# Patient Record
Sex: Female | Born: 1935 | Race: White | Hispanic: No | Marital: Single | Smoking: Never smoker
Health system: Southern US, Community
[De-identification: ages and names within clinical notes are randomized; demographics above are authoritative.]

## PROBLEM LIST (undated history)

## (undated) DIAGNOSIS — Z860100 Personal history of colon polyps, unspecified: Secondary | ICD-10-CM

## (undated) DIAGNOSIS — L02419 Cutaneous abscess of limb, unspecified: Secondary | ICD-10-CM

## (undated) DIAGNOSIS — E785 Hyperlipidemia, unspecified: Secondary | ICD-10-CM

## (undated) DIAGNOSIS — I4510 Unspecified right bundle-branch block: Secondary | ICD-10-CM

## (undated) DIAGNOSIS — E119 Type 2 diabetes mellitus without complications: Secondary | ICD-10-CM

## (undated) DIAGNOSIS — L03119 Cellulitis of unspecified part of limb: Secondary | ICD-10-CM

## (undated) DIAGNOSIS — D649 Anemia, unspecified: Secondary | ICD-10-CM

## (undated) DIAGNOSIS — K5790 Diverticulosis of intestine, part unspecified, without perforation or abscess without bleeding: Secondary | ICD-10-CM

## (undated) DIAGNOSIS — M722 Plantar fascial fibromatosis: Secondary | ICD-10-CM

## (undated) DIAGNOSIS — I482 Chronic atrial fibrillation, unspecified: Secondary | ICD-10-CM

## (undated) DIAGNOSIS — I10 Essential (primary) hypertension: Secondary | ICD-10-CM

## (undated) DIAGNOSIS — K922 Gastrointestinal hemorrhage, unspecified: Secondary | ICD-10-CM

## (undated) DIAGNOSIS — E669 Obesity, unspecified: Secondary | ICD-10-CM

## (undated) DIAGNOSIS — K2970 Gastritis, unspecified, without bleeding: Secondary | ICD-10-CM

## (undated) HISTORY — DX: Personal history of colon polyps, unspecified: Z86.0100

## (undated) HISTORY — DX: Gastritis, unspecified, without bleeding: K29.70

## (undated) HISTORY — DX: Plantar fascial fibromatosis: M72.2

## (undated) HISTORY — DX: Unspecified right bundle-branch block: I45.10

## (undated) HISTORY — DX: Obesity, unspecified: E66.9

## (undated) HISTORY — DX: Gastrointestinal hemorrhage, unspecified: K92.2

## (undated) HISTORY — DX: Anemia, unspecified: D64.9

## (undated) HISTORY — DX: Cellulitis of unspecified part of limb: L03.119

## (undated) HISTORY — DX: Essential (primary) hypertension: I10

## (undated) HISTORY — DX: Cutaneous abscess of limb, unspecified: L02.419

## (undated) HISTORY — DX: Chronic atrial fibrillation, unspecified: I48.20

## (undated) HISTORY — DX: Hyperlipidemia, unspecified: E78.5

## (undated) HISTORY — DX: Type 2 diabetes mellitus without complications: E11.9

## (undated) HISTORY — DX: Personal history of colonic polyps: Z86.010

## (undated) HISTORY — PX: NO PAST SURGERIES: SHX2092

## (undated) HISTORY — DX: Diverticulosis of intestine, part unspecified, without perforation or abscess without bleeding: K57.90

---

## 1999-01-09 ENCOUNTER — Encounter: Payer: Self-pay | Admitting: Obstetrics and Gynecology

## 1999-01-10 ENCOUNTER — Ambulatory Visit (HOSPITAL_COMMUNITY): Admission: RE | Admit: 1999-01-10 | Discharge: 1999-01-10 | Payer: Self-pay | Admitting: Obstetrics and Gynecology

## 1999-10-26 ENCOUNTER — Ambulatory Visit (HOSPITAL_COMMUNITY): Admission: RE | Admit: 1999-10-26 | Discharge: 1999-10-26 | Payer: Self-pay | Admitting: Gastroenterology

## 1999-10-26 ENCOUNTER — Encounter (INDEPENDENT_AMBULATORY_CARE_PROVIDER_SITE_OTHER): Payer: Self-pay | Admitting: *Deleted

## 2001-12-28 ENCOUNTER — Ambulatory Visit (HOSPITAL_COMMUNITY): Admission: RE | Admit: 2001-12-28 | Discharge: 2001-12-28 | Payer: Self-pay | Admitting: Gastroenterology

## 2009-05-04 ENCOUNTER — Encounter: Admission: RE | Admit: 2009-05-04 | Discharge: 2009-05-04 | Payer: Self-pay | Admitting: Internal Medicine

## 2010-12-06 ENCOUNTER — Encounter: Payer: Self-pay | Admitting: Emergency Medicine

## 2010-12-21 ENCOUNTER — Encounter: Payer: Self-pay | Admitting: Emergency Medicine

## 2010-12-25 ENCOUNTER — Encounter: Payer: Self-pay | Admitting: Emergency Medicine

## 2011-01-15 ENCOUNTER — Encounter: Payer: Self-pay | Admitting: Emergency Medicine

## 2011-01-15 DIAGNOSIS — I4891 Unspecified atrial fibrillation: Secondary | ICD-10-CM | POA: Insufficient documentation

## 2011-01-15 DIAGNOSIS — Z860100 Personal history of colon polyps, unspecified: Secondary | ICD-10-CM | POA: Insufficient documentation

## 2011-01-15 DIAGNOSIS — E669 Obesity, unspecified: Secondary | ICD-10-CM | POA: Insufficient documentation

## 2011-01-15 DIAGNOSIS — M722 Plantar fascial fibromatosis: Secondary | ICD-10-CM | POA: Insufficient documentation

## 2011-01-15 DIAGNOSIS — L03119 Cellulitis of unspecified part of limb: Secondary | ICD-10-CM | POA: Insufficient documentation

## 2011-01-15 DIAGNOSIS — L02419 Cutaneous abscess of limb, unspecified: Secondary | ICD-10-CM

## 2011-01-15 DIAGNOSIS — I10 Essential (primary) hypertension: Secondary | ICD-10-CM | POA: Insufficient documentation

## 2011-01-15 DIAGNOSIS — E119 Type 2 diabetes mellitus without complications: Secondary | ICD-10-CM | POA: Insufficient documentation

## 2011-01-15 DIAGNOSIS — J129 Viral pneumonia, unspecified: Secondary | ICD-10-CM | POA: Insufficient documentation

## 2011-01-15 DIAGNOSIS — E785 Hyperlipidemia, unspecified: Secondary | ICD-10-CM | POA: Insufficient documentation

## 2011-01-16 ENCOUNTER — Institutional Professional Consult (permissible substitution) (INDEPENDENT_AMBULATORY_CARE_PROVIDER_SITE_OTHER): Payer: Medicare Other | Admitting: Emergency Medicine

## 2011-01-16 ENCOUNTER — Encounter: Payer: Self-pay | Admitting: Emergency Medicine

## 2011-01-16 ENCOUNTER — Other Ambulatory Visit: Payer: Medicare Other

## 2011-01-16 DIAGNOSIS — I2789 Other specified pulmonary heart diseases: Secondary | ICD-10-CM | POA: Insufficient documentation

## 2011-01-16 DIAGNOSIS — J309 Allergic rhinitis, unspecified: Secondary | ICD-10-CM | POA: Insufficient documentation

## 2011-01-18 LAB — CONVERTED CEMR LAB
Anti Nuclear Antibody(ANA): NEGATIVE
Rheumatoid fact SerPl-aCnc: <10 intl units/mL (ref <=14)
Scleroderma (Scl-70) (ENA) Antibody, IgG: <1 (ref <30)
ds DNA Ab: 4 (ref <30)

## 2011-01-22 NOTE — Assessment & Plan Note (Signed)
Summary: secondary PAH   Visit Type:  Initial Consult Copy to:  Dr. Eldridge Dace Primary Provider/Referring Provider:  Particia Lather  CC:  Pulmonary consult.  Swelling in feet and legs.  Echo done.Marland Kitchen  History of Present Illness: 76 yo never smoker, hx obesity, A Fib, DM, HTN. Followed by Dr Eldridge Dace, has been dealing with refractory LE edema. TTE 12/25/10 showed evidence for PAH as below, and she is referred for this. Apparently has been treated with Advair for wheezing, chest congestion that comes with allergies, nasal congestion, URI's. She uses this as needed. She is scheduled for PSG at [facility]. She describes difficulty falling asleep, freq awakenings sometimes to void, ? snores. No witnessed apneas. Denies any hx SLE or RA. She is on coumadin for chronic A Fib, doubt chronic VTE as a cause for PAH. Never on diet pills.   Preventive Screening-Counseling & Management  Alcohol-Tobacco     Alcohol drinks/day: <1     Alcohol type: wine     Smoking Status: never  Current Medications (verified): 1)  Proventil Hfa 108 (90 Base) Mcg/act Aers (Albuterol Sulfate) .... As Needed 2)  Simvastatin 40 Mg Tabs (Simvastatin) .... Take 1 Tablet By Mouth Once A Day 3)  Cardura 8 Mg Tabs (Doxazosin Mesylate) .... Take 1/2 Tablet By Mouth Once A Day 4)  Klor-Con M10 10 Meq Cr-Tabs (Potassium Chloride Crys Cr) .... 2 By Mouth Daily 5)  Singulair 10 Mg Tabs (Montelukast Sodium) .... Take 1 Tablet By Mouth Once A Day 6)  Metoprolol Tartrate 100 Mg Tabs (Metoprolol Tartrate) .... Take 1 Tablet By Mouth Two Times A Day 7)  Warfarin Sodium 5 Mg Tabs (Warfarin Sodium) .Marland Kitchen.. 1 1/2 Tab By Mouth M/w/f As Directed and 1 By Mouth Su, Tu, Th, Sa 8)  Advair Diskus 250-50 Mcg/dose Aepb (Fluticasone-Salmeterol) .... Inhale 1 Puff Two Times A Day As Needed 9)  Furosemide 40 Mg Tabs (Furosemide) .... Take 1 Tablet By Mouth Once A Day  Allergies (verified): 1)  ! Diltiazem Hcl (Diltiazem Hcl)  Past History:  Past  Surgical History: none  Family History: Family History Asthma --- father Family History Emphysema  --- father Family History Hypertension --- mother  Social History: Marital Status: Widowed Children:  Occupation: Retired from [employer]; was a Diplomatic Services operational officer Patient never smoked.  never used diet drugs.  Alcohol drinks/day:  <1  Review of Systems       The patient complains of shortness of breath with activity, non-productive cough, irregular heartbeats, acid heartburn, indigestion, nasal congestion/difficulty breathing through nose, sneezing, itching, depression, hand/feet swelling, and joint stiffness or pain.  The patient denies shortness of breath at rest, productive cough, coughing up blood, chest pain, loss of appetite, weight change, abdominal pain, difficulty swallowing, sore throat, tooth/dental problems, headaches, ear ache, anxiety, rash, change in color of mucus, and fever.    Vital Signs:  Patient profile:   76 year old female Height:      64.5 inches (163.83 cm) Weight:      245.13 pounds (111.42 kg) BMI:     41.58 O2 Sat:      100 % on Room air Temp:     97.7 degrees F (36.50 degrees C) oral Pulse rate:   88 / minute BP sitting:   130 / 80  (left arm) Cuff size:   large  Vitals Entered By: Michel Bickers CMA (January 16, 2011 1:21 PM)  O2 Sat at Rest %:  100 O2 Flow:  Room air  Serial  Vital Signs/Assessments:  Comments: Ambulatory Pulse Oximetry  Resting; HR__76___    02 Sat__100%RA___  Lap1 (185 feet)   HR_106____   02 Sat__99%RA___ Lap2 (185 feet)   HR_113____   02 Sat__99%RA___    Lap3 (185 feet)   HR_124____   02 Sat__100%RA___  _x__Test Completed without Difficulty ___Test Stopped due to: Zackery Barefoot CMA  January 16, 2011 2:33 PM    By: Zackery Barefoot CMA   CC: Pulmonary consult.  Swelling in feet and legs.  Echo done. Is Patient Diabetic? Yes Comments Medications reviewed with patient Michel Bickers CMA  January 16, 2011 1:40 PM   Physical  Exam  General:  normal appearance and obese.   Head:  normocephalic and atraumatic Eyes:  conjunctiva and sclera clear Nose:  no deformity, discharge, inflammation, or lesions Mouth:  no deformity or lesions, no telangectasisa Neck:  no masses, thyromegaly, or abnormal cervical nodes Lungs:  clear bilaterally to auscultation Heart:  irreg irreg, no M Abdomen:  not examined Msk:  no deformity or scoliosis noted with normal posture Extremities:  trace pretibial edema Neurologic:  non-focal Psych:  poor memory and oriented.     Impression & Recommendations:  Problem # 1:  PULMONARY HYPERTENSION, SECONDARY (ICD-416.8) Secondary PAH with some contribution from A Fib, systemic HTN. Agree that OSA is prob a player here, PSG ordered by Dr Particia Lather. Could consider chronic VTE, but she is on coumadin, has no complaints associated w DVT etc. Never had exposure to Phen-fen, etc.  - walking oximetry today to r/o occult hypoxemia - agree with PSG and rx for OSA if found - auto-immune labs as a screen.  - rov 1 month to review results  Problem # 2:  OBESITY (ICD-278.00)  Problem # 3:  ATRIAL FIBRILLATION (ICD-427.31)  Her updated medication list for this problem includes:    Metoprolol Tartrate 100 Mg Tabs (Metoprolol tartrate) .Marland Kitchen... Take 1 tablet by mouth two times a day    Warfarin Sodium 5 Mg Tabs (Warfarin sodium) .Marland Kitchen... 1 1/2 tab by mouth m/w/f as directed and 1 by mouth su, tu, th, sa  Problem # 4:  HYPERTENSION (ICD-401.9)  Her updated medication list for this problem includes:    Cardura 8 Mg Tabs (Doxazosin mesylate) .Marland Kitchen... Take 1/2 tablet by mouth once a day    Metoprolol Tartrate 100 Mg Tabs (Metoprolol tartrate) .Marland Kitchen... Take 1 tablet by mouth two times a day    Furosemide 40 Mg Tabs (Furosemide) .Marland Kitchen... Take 1 tablet by mouth once a day  Problem # 5:  ALLERGIC RHINITIS (ICD-477.9) With hx flares, often assoc w Asthmatic bronchitis. She has used Advair as needed for this. No hx PFT's.  She uses benadryl as needed, singulair. - consider PFT's and more targeted allergy rx in the future.   Medications Added to Medication List This Visit: 1)  Klor-con M10 10 Meq Cr-tabs (Potassium chloride crys cr) .... 2 by mouth daily 2)  Warfarin Sodium 5 Mg Tabs (Warfarin sodium) .Marland Kitchen.. 1 1/2 tab by mouth m/w/f as directed and 1 by mouth su, tu, th, sa 3)  Advair Diskus 250-50 Mcg/dose Aepb (Fluticasone-salmeterol) .... Inhale 1 puff two times a day as needed  Other Orders: Consultation Level IV (16109) T-Antinuclear Antib (ANA) [phone]) T-Rheumatoid Factor [phone]) T- * Misc. Laboratory test [phone])  Patient Instructions: 1)  Walking oximetry today showed that your oxygen does not drop when you walk 2)  Bloodwork today 3)  Dr Delton Coombes will review your sleep study when  it is available.  4)  Follow up with Dr Delton Coombes in 1 month to review the results.    Immunization History:  Influenza Immunization History:    Influenza:  historical (07/12/2010)

## 2011-01-31 ENCOUNTER — Telehealth: Payer: Self-pay | Admitting: Emergency Medicine

## 2011-01-31 NOTE — Telephone Encounter (Signed)
ATC # provided x 3 - line busy.  WCB. 

## 2011-02-01 NOTE — Telephone Encounter (Signed)
Sleep study requested from Dr. Earl Gala and they will fax over on Monday morning, 02/04/2011.

## 2011-02-01 NOTE — Telephone Encounter (Signed)
Will forward to lori to see if she has results for the pts sleep study

## 2011-02-04 ENCOUNTER — Encounter (INDEPENDENT_AMBULATORY_CARE_PROVIDER_SITE_OTHER): Payer: Medicare Other

## 2011-02-04 ENCOUNTER — Encounter (INDEPENDENT_AMBULATORY_CARE_PROVIDER_SITE_OTHER): Payer: Medicare Other | Admitting: Vascular Surgery

## 2011-02-04 DIAGNOSIS — I83893 Varicose veins of bilateral lower extremities with other complications: Secondary | ICD-10-CM

## 2011-02-04 DIAGNOSIS — R609 Edema, unspecified: Secondary | ICD-10-CM

## 2011-02-05 NOTE — Consult Note (Signed)
NEW PATIENT CONSULTATION  [name], [name] DOB:  Jan 14, 1935                                       02/04/2011 CHART#:[id number]  Patient is a 76 year old female referred by Dr. Eldridge Dace for bilateral edema in her lower extremities.  She states that 2-3 years ago Dr. Eldridge Dace prescribed some medication which eventually led to edema of both lower extremities.  She states the medication has now been discontinued, and the swelling is much better.  She does have some swelling as the day progresses but has had no history of venous stasis ulcers, bleeding, and denies pain in her legs such as aching, throbbing, or burning discomfort at the present time.  She does not wear elastic compression stockings but states that elevation of the legs does help. She does not take pain medication.  She has never had deep vein thrombosis or thrombophlebitis to her knowledge.  She has noted her skin being slightly dark.  CHRONIC MEDICAL PROBLEMS: 1. Diabetes type 2. 2. Hypertension. 3. Hyperlipidemia. 4. COPD. 5. Atrial fibrillation. 6. Negative for coronary artery disease or stroke.  SOCIAL HISTORY:  She is widowed, is retired.  Does not use tobacco or alcohol.  FAMILY HISTORY:  Positive for coronary artery disease.  Father had a myocardial infarction.  Negative for diabetes and stroke.  REVIEW OF SYSTEMS:  Positive for leg discomfort, bronchitis, asthma, wheezing.  May have a sleep disorder.  Has dyspnea on exertion, palpitations, heart murmur, atrial fibrillation.  All other systems are negative in complete review of systems.  PHYSICAL EXAMINATION:  Blood pressure 142/85, heart rate 98, respirations 14.  General:  She is a well-developed and well-nourished female in no apparent distress, alert and oriented x3.  HEENT:  Normal for age.  EOMs intact.  Lungs:  Clear to auscultation.  No rhonchi or wheezing.  Cardiovascular:  Regular rhythm.  No murmurs.  Carotid pulses are 3+.   No audible bruits.  Abdomen:  Obese.  No palpable masses. Lower extremity exam reveals 3+ femoral, popliteal, and dorsalis pedis pulses palpable bilaterally.  Both feet are well-perfused.  She does have darkening of the skin from the knees distally bilaterally.  She has some varicosities in the great saphenous system of the right leg beginning in the mid thigh and extending medially to the knee in the pretibial area.  There is no active ulceration or varicosities in the left leg.  Today I ordered a venous duplex exam of both legs, which I have reviewed and interpreted.  She has mild reflux in her deep system bilaterally. She does have some incompetence in the great saphenous system in the right leg but not up to the junction.  It involves the GSV to about mid thigh to proximal thigh.  Currently her edema has improved, and she denies any pain or discomfort in the legs.  I would recommend short-leg elastic compression stockings and elevation of her legs at night.  If her varicosities becomes symptomatic in the right leg and have increasing discomfort, then certainly there are treatments available for this if they begin to affect her daily living.  At the present time, I do not think that is the case.  She will return to see [place] on a p.r.n. basis.  I think elastic compression stockings and elevation should help her significantly.    Quita Skye Hart Rochester, M.D. Electronically Signed  JDL/MEDQ  D:  02/04/2011  T:  02/05/2011  Job:  [id number]  cc:   Theressa Millard, M.D. Corky Crafts, MD

## 2011-02-06 NOTE — Procedures (Unsigned)
LOWER EXTREMITY VENOUS REFLUX EXAM  INDICATION:  Varicose veins with edema.  EXAM:  Using color-flow imaging and pulse Doppler spectral analysis, the bilateral common femoral, femoral, popliteal, posterior tibial, great and small saphenous veins were evaluated.  There is evidence suggesting deep venous insufficiency in the right common femoral and superficial femoral veins of the lower extremity.  There is evidence suggesting deep venous insufficiency in the left superficial femoral vein of the left lower extremity.  The bilateral saphenofemoral junctions appear competent.  The right GSV is not competent with focal reflux of >500 milliseconds at the proximal mid thigh segment.  The left GSV is not competent with focal reflux of >500 milliseconds at the knee segment.  The bilateral proximal small saphenous veins demonstrate competency.  GSV Diameter (used if found to be incompetent only)                                           Right    Left Proximal Greater Saphenous Vein           cm       cm Proximal-to-mid-thigh                     0.46 cm  cm Mid thigh                                 0.42 cm  cm Mid-distal thigh                          cm       cm Distal thigh                              cm       0.68 cm Knee                                      cm       0.63 cm  IMPRESSION: 1. The bilateral greater saphenous veins are not competent with focal     reflux of >500 milliseconds present. 2. The bilateral greater saphenous veins are tortuous. 3. The deep venous system is not competent with reflux of >500     milliseconds. 4. The right and left small saphenous vein is competent.  ___________________________________________ Quita Skye. Hart Rochester, M.D.  SH/MEDQ  D:  02/04/2011  T:  02/04/2011  Job:  [id number]

## 2011-02-07 NOTE — Letter (Signed)
Summary: [facility]  [facility]   Imported By: Sherian Rein 01/28/2011 13:47:59  _____________________________________________________________________  External Attachment:    Type:   Image     Comment:   External Document

## 2011-02-07 NOTE — Telephone Encounter (Signed)
Lawson Fiscal did you receive these results? Carron Curie, CMA

## 2011-02-08 NOTE — Telephone Encounter (Signed)
I spoke with someone in medical records regarding pt's sleep study and told her that we never received those records. She was going to check on the results and call back because she did not see the actual study but did see a note where Dr. Earl Gala spoke with the pt regarding results. I also lmtcb so we can notify pt that we are working on getting these results.

## 2011-02-08 NOTE — Telephone Encounter (Signed)
Patient phoned stated that she was returning a call to either [facility] or Triage she can be reached at [phone].Vedia Coffer

## 2011-02-08 NOTE — Telephone Encounter (Signed)
Sleep study received and pt is aware. Will place in RB look at so he may review when he returns to the office in 1 week.

## 2011-02-08 NOTE — Telephone Encounter (Signed)
[name] with [facility] called she is faxing sleep study.Vedia Coffer

## 2011-02-12 ENCOUNTER — Telehealth: Payer: Self-pay | Admitting: Emergency Medicine

## 2011-02-12 NOTE — Telephone Encounter (Signed)
Spoke with pt and she states she had a test done over at [facility] done by Dr. Hart Rochester. Pt could not remember what the test was called. Pt was referred by Dr. Eldridge Dace to have the test done. Pt states she is not sure if Dr. Delton Coombes would want the results of this test or not. Pt states if Dr. Delton Coombes does then we will need to call for the results to be faxed over. Please advise Dr. Delton Coombes if you would like these test results. Thanks

## 2011-02-13 NOTE — Telephone Encounter (Signed)
Pt will have Dr Candie Chroman office send copy of records and test results so Dr. Delton Coombes may review.

## 2011-02-14 ENCOUNTER — Telehealth: Payer: Self-pay | Admitting: Emergency Medicine

## 2011-02-14 NOTE — Telephone Encounter (Signed)
LMOM to let pt know [facility] will be faxing over results of lower extremity doppler study. No need for pt to return call unless she has further questions for the nurse.

## 2011-02-18 ENCOUNTER — Encounter: Payer: Self-pay | Admitting: Emergency Medicine

## 2011-02-20 ENCOUNTER — Ambulatory Visit (INDEPENDENT_AMBULATORY_CARE_PROVIDER_SITE_OTHER): Payer: Medicare Other | Admitting: Emergency Medicine

## 2011-02-20 ENCOUNTER — Encounter: Payer: Self-pay | Admitting: Emergency Medicine

## 2011-02-20 DIAGNOSIS — I4891 Unspecified atrial fibrillation: Secondary | ICD-10-CM

## 2011-02-20 DIAGNOSIS — J309 Allergic rhinitis, unspecified: Secondary | ICD-10-CM

## 2011-02-20 DIAGNOSIS — I10 Essential (primary) hypertension: Secondary | ICD-10-CM

## 2011-02-20 DIAGNOSIS — I2789 Other specified pulmonary heart diseases: Secondary | ICD-10-CM

## 2011-02-20 NOTE — Patient Instructions (Addendum)
Continue your coumadin, lasix, metoprolol as directed by Dr Lyn Hollingshead are cleared to increase your exercise, work on your cardiopulmonary conditioning You can probably stop Advair. Use albuterol as needed You do not need CPAP at this time.  Follow up with Dr Delton Coombes in 6 months

## 2011-02-20 NOTE — Assessment & Plan Note (Signed)
She can likely stop Advair, continue albuterol prn Not on any allergy regimen at this time.

## 2011-02-20 NOTE — Assessment & Plan Note (Addendum)
Mild OSA, likely does not needs CPAP - continue to treat HTN, A Fib - continue coumadin - rov 6 months

## 2011-02-20 NOTE — Progress Notes (Signed)
  Subjective:    Patient ID: [name], female    DOB: 19-Aug-1935, 76 y.o.   MRN: [id number]  HPI 76 yo never smoker, hx obesity, A Fib, DM, HTN. Followed by Dr Eldridge Dace, has been dealing with refractory LE edema. TTE 12/25/10 showed evidence for PAH as below, and she is referred for this. Apparently has been treated with Advair for wheezing, chest congestion that comes with allergies, nasal congestion, URI's. She uses this as needed. She is scheduled for PSG at [facility]. She describes difficulty falling asleep, freq awakenings sometimes to void, ? snores. No witnessed apneas. Denies any hx SLE or RA. She is on coumadin for chronic A Fib, doubt chronic VTE as a cause for PAH. Never on diet pills.   ROV 02/20/11 -- Secondary PAH from the above. Last time autoimmune labs negative, walking oximetry w no desats. She is taking Advair prn for resp sx due to allergies. She had PSG at [facility] (01/29/11) - showed mild OSA with AHI 5.1/hour, no desats.    Review of Systems  HENT: Positive for rhinorrhea, sneezing and postnasal drip.   Respiratory: Positive for cough and shortness of breath (with exertion).   Cardiovascular: Positive for leg swelling.       Objective:   Physical Exam Gen: Pleasant, obese, in no distress,  normal affect  ENT: No lesions,  mouth clear,  oropharynx clear, no postnasal drip  Neck: No JVD, no TMG, no carotid bruits  Lungs: No use of accessory muscles, no dullness to percussion, clear without rales or rhonchi  Cardiovascular: irreg irreg, 1+ peripheral edema  Musculoskeletal: No deformities, no cyanosis or clubbing  Neuro: alert, non focal  Skin: Warm, no lesions or rashes     Assessment & Plan:  PULMONARY HYPERTENSION, SECONDARY Mild OSA, likely does not needs CPAP - continue to treat HTN, A Fib - continue coumadin - rov 6 months   ALLERGIC RHINITIS She can likely stop Advair, continue albuterol prn Not on any allergy regimen at this time.

## 2011-02-26 ENCOUNTER — Encounter: Payer: Self-pay | Admitting: Emergency Medicine

## 2011-03-19 ENCOUNTER — Encounter (INDEPENDENT_AMBULATORY_CARE_PROVIDER_SITE_OTHER): Payer: Medicare Other

## 2011-03-19 DIAGNOSIS — I83893 Varicose veins of bilateral lower extremities with other complications: Secondary | ICD-10-CM

## 2011-03-22 ENCOUNTER — Encounter (INDEPENDENT_AMBULATORY_CARE_PROVIDER_SITE_OTHER): Payer: Medicare Other

## 2011-03-22 DIAGNOSIS — I83893 Varicose veins of bilateral lower extremities with other complications: Secondary | ICD-10-CM

## 2012-01-13 ENCOUNTER — Encounter: Payer: Self-pay | Admitting: Emergency Medicine

## 2012-01-13 ENCOUNTER — Ambulatory Visit (INDEPENDENT_AMBULATORY_CARE_PROVIDER_SITE_OTHER): Payer: Medicare Other | Admitting: Emergency Medicine

## 2012-01-13 DIAGNOSIS — J309 Allergic rhinitis, unspecified: Secondary | ICD-10-CM

## 2012-01-13 DIAGNOSIS — I2789 Other specified pulmonary heart diseases: Secondary | ICD-10-CM

## 2012-01-13 MED ORDER — FEXOFENADINE HCL 180 MG PO TABS: 180.0000 mg | ORAL_TABLET | Freq: Every day | ORAL | Status: DC

## 2012-01-13 NOTE — Assessment & Plan Note (Addendum)
Agree with fluticasone nasal spray, will increase to bid Add nasal saline washes and fexofenadine Change to albuterol prn from Advair rov 6 weeks

## 2012-01-13 NOTE — Assessment & Plan Note (Signed)
Continue HTN regimen and coumadin

## 2012-01-13 NOTE — Progress Notes (Signed)
  Subjective:    Patient ID: [name], female    DOB: 1935-10-10, 77 y.o.   MRN: [id number]  HPI 77 yo never smoker, hx obesity, A Fib, DM, HTN. Followed by Dr Eldridge Dace, has been dealing with refractory LE edema. TTE 12/25/10 showed evidence for PAH as below, and she is referred for this. Apparently has been treated with Advair for wheezing, chest congestion that comes with allergies, nasal congestion, URI's. She uses this as needed. She is scheduled for PSG at [facility]. She describes difficulty falling asleep, freq awakenings sometimes to void, ? snores. No witnessed apneas. Denies any hx SLE or RA. She is on coumadin for chronic A Fib, doubt chronic VTE as a cause for PAH. Never on diet pills.   ROV 02/20/11 -- Secondary PAH from the above. Last time autoimmune labs negative, walking oximetry w no desats. She is taking Advair prn for resp sx due to allergies. She had PSG at [facility] (01/29/11) - showed mild OSA with AHI 5.1/hour, no desats.   ROV 01/13/12 -- Secondary PAH from hx obesity, A Fib, DM, HTN. No OSA on PSG 01/29/11. On HTN regimen and coumadin.  Last time stopped prn Advair, not on an allergy regimen. She continues to episodic LE edema, lasix was increased to 80mg  am and 40mg  evening. She still has nasal drainage, but also sometimes nasal dryness, hoarse voice. Occasional wheeze, taking Advair again prn.  Flonase started 2 weeks ago with some improvement but still w drainage.    Review of Systems  HENT: Positive for rhinorrhea, sneezing and postnasal drip.   Respiratory: Positive for cough and shortness of breath (with exertion).   Cardiovascular: Positive for leg swelling.       Objective:   Physical Exam Gen: Pleasant, obese, in no distress,  normal affect  ENT: No lesions,  mouth clear,  oropharynx clear, no postnasal drip  Neck: No JVD, no TMG, no carotid bruits  Lungs: No use of accessory muscles, no dullness to percussion, clear without rales or  rhonchi  Cardiovascular: irreg irreg, 1+ peripheral edema  Musculoskeletal: No deformities, no cyanosis or clubbing  Neuro: alert, non focal  Skin: Warm, no lesions or rashes     Assessment & Plan:  PULMONARY HYPERTENSION, SECONDARY Continue HTN regimen and coumadin  ALLERGIC RHINITIS Agree with fluticasone nasal spray, will increase to bid Add nasal saline washes and fexofenadine Change to albuterol prn from Advair rov 6 weeks

## 2012-01-13 NOTE — Patient Instructions (Addendum)
Please continue your fluticasone nose spray, 2 sprays each nostril twice a day Start taking fexofenadine 180mg  (Allegra) daily Start doing nasal saline washes every day If you have shortness of breath, try using ProAir 2 puffs instead of Advair Follow with Dr Delton Coombes in 6 weeks to review your status

## 2012-02-20 ENCOUNTER — Ambulatory Visit (INDEPENDENT_AMBULATORY_CARE_PROVIDER_SITE_OTHER): Payer: Medicare Other | Admitting: Emergency Medicine

## 2012-02-20 ENCOUNTER — Encounter: Payer: Self-pay | Admitting: Emergency Medicine

## 2012-02-20 VITALS — BP 92/58 | HR 64 | Temp 97.7°F | Ht 64.5 in | Wt 234.4 lb

## 2012-02-20 DIAGNOSIS — I2789 Other specified pulmonary heart diseases: Secondary | ICD-10-CM

## 2012-02-20 DIAGNOSIS — J309 Allergic rhinitis, unspecified: Secondary | ICD-10-CM

## 2012-02-20 NOTE — Assessment & Plan Note (Signed)
-   agree with plans for pulm rehab

## 2012-02-20 NOTE — Patient Instructions (Signed)
Please continue your fexofenadine, fluticasone nasal spray and nasal saline washes Use albuterol 2 puffs as needed for shortness of breath Participate in Rehab as planned Follow with Dr Delton Coombes in 4 months or sooner if you have any problems.

## 2012-02-20 NOTE — Assessment & Plan Note (Signed)
-   continue same regimen 

## 2012-02-20 NOTE — Progress Notes (Signed)
  Subjective:    Patient ID: [name], female    DOB: 09/05/1935, 77 y.o.   MRN: [id number]  HPI 77 yo never smoker, hx obesity, A Fib, DM, HTN. Followed by Dr Eldridge Dace, has been dealing with refractory LE edema. TTE 12/25/10 showed evidence for PAH as below, and she is referred for this. Apparently has been treated with Advair for wheezing, chest congestion that comes with allergies, nasal congestion, URI's. She uses this as needed. She is scheduled for PSG at [facility]. She describes difficulty falling asleep, freq awakenings sometimes to void, ? snores. No witnessed apneas. Denies any hx SLE or RA. She is on coumadin for chronic A Fib, doubt chronic VTE as a cause for PAH. Never on diet pills.   ROV 02/20/11 -- Secondary PAH from the above. Last time autoimmune labs negative, walking oximetry w no desats. She is taking Advair prn for resp sx due to allergies. She had PSG at [facility] (01/29/11) - showed mild OSA with AHI 5.1/hour, no desats.   ROV 01/13/12 -- Secondary PAH from hx obesity, A Fib, DM, HTN. No OSA on PSG 01/29/11. On HTN regimen and coumadin.  Last time stopped prn Advair, not on an allergy regimen. She continues to episodic LE edema, lasix was increased to 80mg  am and 40mg  evening. She still has nasal drainage, but also sometimes nasal dryness, hoarse voice. Occasional wheeze, taking Advair again prn.  Flonase started 2 weeks ago with some improvement but still w drainage.   ROV 02/20/12 -- Secondary PAH from hx obesity, A Fib, DM, HTN. No OSA on PSG 01/29/11. Last visit we started fexofenadine and [place], increased fluticasone, stopped Advair. She is using SABA prn, usually about bid. Her cough is better. Breathing has tolerated d/c Advair. Bothered by severe progressive LE edema through the day.    Review of Systems  HENT: Positive for rhinorrhea, sneezing and postnasal drip.   Respiratory: Positive for cough and shortness of breath (with exertion).   Cardiovascular: Positive for  leg swelling.       Objective:   Physical Exam Gen: Pleasant, obese, in no distress,  normal affect  ENT: No lesions,  mouth clear,  oropharynx clear, no postnasal drip  Neck: No JVD, no TMG, no carotid bruits  Lungs: No use of accessory muscles, no dullness to percussion, clear without rales or rhonchi  Cardiovascular: irreg irreg, 1+ peripheral edema  Musculoskeletal: No deformities, no cyanosis or clubbing  Neuro: alert, non focal  Skin: Warm, no lesions or rashes     Assessment & Plan:  PULMONARY HYPERTENSION, SECONDARY - agree with plans for pulm rehab  ALLERGIC RHINITIS - continue same regimen

## 2012-03-04 ENCOUNTER — Ambulatory Visit: Payer: Medicare Other | Attending: Internal Medicine | Admitting: Physical Therapy

## 2012-03-04 DIAGNOSIS — R262 Difficulty in walking, not elsewhere classified: Secondary | ICD-10-CM | POA: Insufficient documentation

## 2012-03-04 DIAGNOSIS — IMO0001 Reserved for inherently not codable concepts without codable children: Secondary | ICD-10-CM | POA: Insufficient documentation

## 2012-03-04 DIAGNOSIS — R269 Unspecified abnormalities of gait and mobility: Secondary | ICD-10-CM | POA: Insufficient documentation

## 2012-03-06 ENCOUNTER — Ambulatory Visit: Payer: Medicare Other | Admitting: Physical Therapy

## 2012-03-09 ENCOUNTER — Ambulatory Visit: Payer: Medicare Other | Admitting: Physical Therapy

## 2012-03-12 ENCOUNTER — Ambulatory Visit: Payer: Medicare Other | Attending: Internal Medicine | Admitting: Physical Therapy

## 2012-03-12 DIAGNOSIS — IMO0001 Reserved for inherently not codable concepts without codable children: Secondary | ICD-10-CM | POA: Insufficient documentation

## 2012-03-12 DIAGNOSIS — R262 Difficulty in walking, not elsewhere classified: Secondary | ICD-10-CM | POA: Insufficient documentation

## 2012-03-12 DIAGNOSIS — R269 Unspecified abnormalities of gait and mobility: Secondary | ICD-10-CM | POA: Insufficient documentation

## 2012-03-23 ENCOUNTER — Ambulatory Visit: Payer: Medicare Other | Admitting: Physical Therapy

## 2012-03-26 ENCOUNTER — Ambulatory Visit: Payer: Medicare Other | Admitting: Physical Therapy

## 2012-03-31 ENCOUNTER — Ambulatory Visit: Payer: Medicare Other | Admitting: Physical Therapy

## 2012-04-02 ENCOUNTER — Ambulatory Visit: Payer: Medicare Other | Admitting: Physical Therapy

## 2012-04-03 ENCOUNTER — Ambulatory Visit: Payer: Medicare Other | Admitting: Physical Therapy

## 2012-04-07 ENCOUNTER — Ambulatory Visit: Payer: Medicare Other | Admitting: Physical Therapy

## 2012-04-13 ENCOUNTER — Ambulatory Visit: Payer: Medicare Other | Attending: Internal Medicine | Admitting: Physical Therapy

## 2012-04-13 DIAGNOSIS — IMO0001 Reserved for inherently not codable concepts without codable children: Secondary | ICD-10-CM | POA: Insufficient documentation

## 2012-04-13 DIAGNOSIS — R262 Difficulty in walking, not elsewhere classified: Secondary | ICD-10-CM | POA: Insufficient documentation

## 2012-04-13 DIAGNOSIS — R269 Unspecified abnormalities of gait and mobility: Secondary | ICD-10-CM | POA: Insufficient documentation

## 2012-04-15 ENCOUNTER — Ambulatory Visit: Payer: Medicare Other | Admitting: Physical Therapy

## 2012-04-20 ENCOUNTER — Ambulatory Visit: Payer: Medicare Other | Admitting: Physical Therapy

## 2012-04-22 ENCOUNTER — Ambulatory Visit: Payer: Medicare Other | Admitting: Physical Therapy

## 2012-04-27 ENCOUNTER — Ambulatory Visit: Payer: Medicare Other | Admitting: Physical Therapy

## 2012-04-29 ENCOUNTER — Ambulatory Visit: Payer: Medicare Other | Admitting: Physical Therapy

## 2012-05-04 ENCOUNTER — Ambulatory Visit: Payer: Medicare Other | Admitting: Physical Therapy

## 2012-05-07 ENCOUNTER — Ambulatory Visit: Payer: Medicare Other | Admitting: Physical Therapy

## 2012-05-12 ENCOUNTER — Ambulatory Visit: Payer: Medicare Other | Attending: Internal Medicine | Admitting: Physical Therapy

## 2012-05-12 DIAGNOSIS — IMO0001 Reserved for inherently not codable concepts without codable children: Secondary | ICD-10-CM | POA: Insufficient documentation

## 2012-05-12 DIAGNOSIS — R269 Unspecified abnormalities of gait and mobility: Secondary | ICD-10-CM | POA: Insufficient documentation

## 2012-05-12 DIAGNOSIS — R262 Difficulty in walking, not elsewhere classified: Secondary | ICD-10-CM | POA: Insufficient documentation

## 2012-05-15 ENCOUNTER — Ambulatory Visit: Payer: Medicare Other | Admitting: Physical Therapy

## 2012-05-20 ENCOUNTER — Ambulatory Visit: Payer: Medicare Other | Admitting: Physical Therapy

## 2012-05-22 ENCOUNTER — Ambulatory Visit: Payer: Medicare Other | Admitting: Physical Therapy

## 2012-05-27 ENCOUNTER — Ambulatory Visit: Payer: Medicare Other | Admitting: Physical Therapy

## 2012-05-29 ENCOUNTER — Ambulatory Visit: Payer: Medicare Other | Admitting: Physical Therapy

## 2012-06-16 ENCOUNTER — Telehealth: Payer: Self-pay | Admitting: Emergency Medicine

## 2012-06-16 NOTE — Telephone Encounter (Signed)
Called pt to schedule follow up apt x3.Sent letter 06/08/12. ° °

## 2013-04-18 ENCOUNTER — Emergency Department: Payer: Self-pay | Admitting: Emergency Medicine

## 2013-04-18 LAB — CBC WITH DIFFERENTIAL/PLATELET
Basophil #: 0.1 10*3/uL (ref 0.0–0.1)
Basophil %: 1.2 %
Eosinophil #: 0.5 10*3/uL (ref 0.0–0.7)
HCT: 29.4 % — ABNORMAL LOW (ref 35.0–47.0)
Lymphocyte #: 1.7 10*3/uL (ref 1.0–3.6)
Lymphocyte %: 19.1 %
MCHC: 33.9 g/dL (ref 32.0–36.0)
MCV: 101 fL — ABNORMAL HIGH (ref 80–100)
Monocyte #: 0.9 x10 3/mm (ref 0.2–0.9)
Neutrophil #: 5.6 10*3/uL (ref 1.4–6.5)
RBC: 2.91 10*6/uL — ABNORMAL LOW (ref 3.80–5.20)
WBC: 8.8 10*3/uL (ref 3.6–11.0)

## 2013-04-18 LAB — APTT: Activated PTT: 40.8 secs — ABNORMAL HIGH (ref 23.6–35.9)

## 2013-04-18 LAB — PROTIME-INR
INR: 1.5
Prothrombin Time: 18 secs — ABNORMAL HIGH (ref 11.5–14.7)

## 2013-07-28 ENCOUNTER — Other Ambulatory Visit: Payer: Self-pay | Admitting: Interventional Cardiology

## 2013-07-28 DIAGNOSIS — Z79899 Other long term (current) drug therapy: Secondary | ICD-10-CM

## 2013-07-28 DIAGNOSIS — I4891 Unspecified atrial fibrillation: Secondary | ICD-10-CM

## 2014-01-24 ENCOUNTER — Other Ambulatory Visit: Payer: Medicare Other

## 2014-01-26 ENCOUNTER — Other Ambulatory Visit (INDEPENDENT_AMBULATORY_CARE_PROVIDER_SITE_OTHER): Payer: Medicare Other

## 2014-01-26 DIAGNOSIS — I4891 Unspecified atrial fibrillation: Secondary | ICD-10-CM

## 2014-01-26 DIAGNOSIS — Z79899 Other long term (current) drug therapy: Secondary | ICD-10-CM

## 2014-01-26 LAB — CBC
HCT: 36.9 % (ref 36.0–46.0)
Hemoglobin: 12.1 g/dL (ref 12.0–15.0)
MCHC: 32.7 g/dL (ref 30.0–36.0)
MCV: 105 fl — ABNORMAL HIGH (ref 78.0–100.0)
Platelets: 280 10*3/uL (ref 150.0–400.0)
RBC: 3.52 Mil/uL — ABNORMAL LOW (ref 3.87–5.11)
RDW: 17.5 % — AB (ref 11.5–14.6)
WBC: 6.5 10*3/uL (ref 4.5–10.5)

## 2014-01-26 LAB — CREATININE, SERUM: CREATININE: 0.8 mg/dL (ref 0.4–1.2)

## 2014-02-04 ENCOUNTER — Other Ambulatory Visit: Payer: Self-pay | Admitting: Cardiology

## 2014-02-04 ENCOUNTER — Encounter: Payer: Self-pay | Admitting: Cardiology

## 2014-02-04 DIAGNOSIS — I4891 Unspecified atrial fibrillation: Secondary | ICD-10-CM

## 2014-02-20 ENCOUNTER — Inpatient Hospital Stay: Payer: Self-pay | Admitting: Specialist

## 2014-02-20 LAB — COMPREHENSIVE METABOLIC PANEL
Albumin: 2.9 g/dL — ABNORMAL LOW (ref 3.4–5.0)
Alkaline Phosphatase: 87 U/L
Anion Gap: 6 — ABNORMAL LOW (ref 7–16)
BUN: 20 mg/dL — ABNORMAL HIGH (ref 7–18)
Bilirubin,Total: 0.6 mg/dL (ref 0.2–1.0)
Calcium, Total: 8.3 mg/dL — ABNORMAL LOW (ref 8.5–10.1)
Chloride: 107 mmol/L (ref 98–107)
Co2: 27 mmol/L (ref 21–32)
Creatinine: 0.95 mg/dL (ref 0.60–1.30)
EGFR (African American): >60
EGFR (Non-African Amer.): 58 — ABNORMAL LOW
Glucose: 112 mg/dL — ABNORMAL HIGH (ref 65–99)
Osmolality: 283 (ref 275–301)
Potassium: 4 mmol/L (ref 3.5–5.1)
SGOT(AST): 17 U/L (ref 15–37)
SGPT (ALT): 11 U/L — ABNORMAL LOW (ref 12–78)
Sodium: 140 mmol/L (ref 136–145)
Total Protein: 6 g/dL — ABNORMAL LOW (ref 6.4–8.2)

## 2014-02-20 LAB — CBC
HCT: 27 % — ABNORMAL LOW (ref 35.0–47.0)
HGB: 8.9 g/dL — AB (ref 12.0–16.0)
MCH: 34.3 pg — ABNORMAL HIGH (ref 26.0–34.0)
MCHC: 32.9 g/dL (ref 32.0–36.0)
MCV: 104 fL — AB (ref 80–100)
Platelet: 254 10*3/uL (ref 150–440)
RBC: 2.59 10*6/uL — ABNORMAL LOW (ref 3.80–5.20)
RDW: 16.2 % — AB (ref 11.5–14.5)
WBC: 7.4 10*3/uL (ref 3.6–11.0)

## 2014-02-20 LAB — URINALYSIS, COMPLETE
BILIRUBIN, UR: NEGATIVE
Glucose,UR: NEGATIVE mg/dL (ref 0–75)
Nitrite: NEGATIVE
Ph: 5 (ref 4.5–8.0)
Protein: NEGATIVE
RBC,UR: 71 /HPF (ref 0–5)
Specific Gravity: 1.018 (ref 1.003–1.030)

## 2014-02-20 LAB — PROTIME-INR
INR: 2.1
PROTHROMBIN TIME: 22.7 s — AB (ref 11.5–14.7)

## 2014-02-21 LAB — CBC WITH DIFFERENTIAL/PLATELET
BASOS ABS: 0.1 10*3/uL (ref 0.0–0.1)
BASOS PCT: 1.1 %
BASOS PCT: 1.4 %
BASOS PCT: 0.8 %
Basophil #: 0.1 10*3/uL (ref 0.0–0.1)
Basophil #: 0.1 10*3/uL (ref 0.0–0.1)
EOS ABS: 0.1 10*3/uL (ref 0.0–0.7)
EOS ABS: 0.1 10*3/uL (ref 0.0–0.7)
EOS PCT: 1.3 %
EOS PCT: 2.2 %
Eosinophil #: 0.2 10*3/uL (ref 0.0–0.7)
Eosinophil %: 1.6 %
HCT: 25.3 % — ABNORMAL LOW (ref 35.0–47.0)
HCT: 24.1 % — ABNORMAL LOW (ref 35.0–47.0)
HCT: 21.4 % — AB (ref 35.0–47.0)
HGB: 6.9 g/dL — ABNORMAL LOW (ref 12.0–16.0)
HGB: 8.2 g/dL — ABNORMAL LOW (ref 12.0–16.0)
HGB: 8.3 g/dL — AB (ref 12.0–16.0)
Lymphocyte #: 1.3 10*3/uL (ref 1.0–3.6)
Lymphocyte #: 1.5 10*3/uL (ref 1.0–3.6)
Lymphocyte #: 1.6 10*3/uL (ref 1.0–3.6)
Lymphocyte %: 16.5 %
Lymphocyte %: 17 %
Lymphocyte %: 21.3 %
MCH: 32.7 pg (ref 26.0–34.0)
MCH: 33 pg (ref 26.0–34.0)
MCH: 33.9 pg (ref 26.0–34.0)
MCHC: 32.9 g/dL (ref 32.0–36.0)
MCHC: 33.8 g/dL (ref 32.0–36.0)
MCHC: 32.3 g/dL (ref 32.0–36.0)
MCV: 105 fL — ABNORMAL HIGH (ref 80–100)
MCV: 98 fL (ref 80–100)
MCV: 99 fL (ref 80–100)
Monocyte #: 0.6 x10 3/mm (ref 0.2–0.9)
Monocyte #: 0.7 x10 3/mm (ref 0.2–0.9)
Monocyte #: 0.7 x10 3/mm (ref 0.2–0.9)
Monocyte %: 6.7 %
Monocyte %: 8.6 %
Monocyte %: 9.7 %
NEUTROS ABS: 6.4 10*3/uL (ref 1.4–6.5)
Neutrophil #: 5.6 10*3/uL (ref 1.4–6.5)
Neutrophil #: 5 10*3/uL (ref 1.4–6.5)
Neutrophil %: 65.4 %
Neutrophil %: 72.5 %
Neutrophil %: 73.9 %
PLATELETS: 206 10*3/uL (ref 150–440)
Platelet: 195 10*3/uL (ref 150–440)
Platelet: 232 10*3/uL (ref 150–440)
RBC: 2.04 10*6/uL — ABNORMAL LOW (ref 3.80–5.20)
RBC: 2.47 10*6/uL — ABNORMAL LOW (ref 3.80–5.20)
RBC: 2.54 10*6/uL — ABNORMAL LOW (ref 3.80–5.20)
RDW: 16.6 % — ABNORMAL HIGH (ref 11.5–14.5)
RDW: 20.6 % — ABNORMAL HIGH (ref 11.5–14.5)
RDW: 21 % — ABNORMAL HIGH (ref 11.5–14.5)
WBC: 7.7 10*3/uL (ref 3.6–11.0)
WBC: 7.6 10*3/uL (ref 3.6–11.0)
WBC: 8.6 10*3/uL (ref 3.6–11.0)

## 2014-02-21 LAB — BASIC METABOLIC PANEL
ANION GAP: 7 (ref 7–16)
BUN: 22 mg/dL — AB (ref 7–18)
CALCIUM: 8.2 mg/dL — AB (ref 8.5–10.1)
CHLORIDE: 111 mmol/L — AB (ref 98–107)
CREATININE: 1.01 mg/dL (ref 0.60–1.30)
Co2: 25 mmol/L (ref 21–32)
EGFR (African American): >60
GFR CALC NON AF AMER: 54 — AB
GLUCOSE: 119 mg/dL — AB (ref 65–99)
Osmolality: 289 (ref 275–301)
Potassium: 4.3 mmol/L (ref 3.5–5.1)
Sodium: 143 mmol/L (ref 136–145)

## 2014-02-21 LAB — HEMOGLOBIN
HGB: 10.5 g/dL — ABNORMAL LOW (ref 12.0–16.0)
HGB: 8.5 g/dL — AB (ref 12.0–16.0)

## 2014-02-21 LAB — MAGNESIUM: MAGNESIUM: 1.9 mg/dL

## 2014-02-22 DIAGNOSIS — D62 Acute posthemorrhagic anemia: Secondary | ICD-10-CM

## 2014-02-22 DIAGNOSIS — I10 Essential (primary) hypertension: Secondary | ICD-10-CM

## 2014-02-22 DIAGNOSIS — I4891 Unspecified atrial fibrillation: Secondary | ICD-10-CM

## 2014-02-22 LAB — CBC WITH DIFFERENTIAL/PLATELET
BASOS PCT: 1.6 %
Basophil #: 0.1 10*3/uL (ref 0.0–0.1)
EOS ABS: 0.3 10*3/uL (ref 0.0–0.7)
Eosinophil %: 4.5 %
HCT: 23.1 % — ABNORMAL LOW (ref 35.0–47.0)
HGB: 7.3 g/dL — ABNORMAL LOW (ref 12.0–16.0)
Lymphocyte #: 1.5 10*3/uL (ref 1.0–3.6)
Lymphocyte %: 23.2 %
MCH: 31 pg (ref 26.0–34.0)
MCHC: 31.7 g/dL — ABNORMAL LOW (ref 32.0–36.0)
MCV: 98 fL (ref 80–100)
Monocyte #: 0.7 x10 3/mm (ref 0.2–0.9)
Monocyte %: 10.2 %
NEUTROS PCT: 60.5 %
Neutrophil #: 4 10*3/uL (ref 1.4–6.5)
Platelet: 203 10*3/uL (ref 150–440)
RBC: 2.36 10*6/uL — ABNORMAL LOW (ref 3.80–5.20)
RDW: 21.4 % — AB (ref 11.5–14.5)
WBC: 6.7 10*3/uL (ref 3.6–11.0)

## 2014-02-22 LAB — HEMOGLOBIN: HGB: 10 g/dL — ABNORMAL LOW (ref 12.0–16.0)

## 2014-02-22 LAB — PROTIME-INR
INR: 1.3
PROTHROMBIN TIME: 15.6 s — AB (ref 11.5–14.7)

## 2014-02-23 LAB — CBC WITH DIFFERENTIAL/PLATELET
BASOS PCT: 1.1 %
Basophil #: 0.1 10*3/uL (ref 0.0–0.1)
EOS PCT: 5.3 %
Eosinophil #: 0.4 10*3/uL (ref 0.0–0.7)
HCT: 26.1 % — AB (ref 35.0–47.0)
HGB: 8.7 g/dL — ABNORMAL LOW (ref 12.0–16.0)
Lymphocyte #: 1.7 10*3/uL (ref 1.0–3.6)
Lymphocyte %: 22.4 %
MCH: 32.5 pg (ref 26.0–34.0)
MCHC: 33.3 g/dL (ref 32.0–36.0)
MCV: 98 fL (ref 80–100)
MONOS PCT: 9.6 %
Monocyte #: 0.7 x10 3/mm (ref 0.2–0.9)
NEUTROS ABS: 4.5 10*3/uL (ref 1.4–6.5)
Neutrophil %: 61.6 %
Platelet: 199 10*3/uL (ref 150–440)
RBC: 2.68 10*6/uL — ABNORMAL LOW (ref 3.80–5.20)
RDW: 21.5 % — AB (ref 11.5–14.5)
WBC: 7.4 10*3/uL (ref 3.6–11.0)

## 2014-02-23 LAB — HEMOGLOBIN: HGB: 9.3 g/dL — ABNORMAL LOW (ref 12.0–16.0)

## 2014-02-24 ENCOUNTER — Telehealth: Payer: Self-pay

## 2014-02-24 LAB — PATHOLOGY REPORT

## 2014-02-24 NOTE — Telephone Encounter (Signed)
Patient contacted regarding discharge [facility] on 02/23/14.  Patient understands to follow up with Ward Givenshris Berge, NP on 03/01/14 at 2:45 at [facility]. Patient understands discharge instructions? yes Patient understands medications and regiment? yes Patient understands to bring all medications to this visit? yes  Reviewed meds w/ pt.

## 2014-02-25 ENCOUNTER — Encounter: Payer: Self-pay | Admitting: Interventional Cardiology

## 2014-02-25 ENCOUNTER — Encounter: Payer: Self-pay | Admitting: *Deleted

## 2014-02-28 ENCOUNTER — Encounter: Payer: Self-pay | Admitting: *Deleted

## 2014-03-01 ENCOUNTER — Ambulatory Visit (INDEPENDENT_AMBULATORY_CARE_PROVIDER_SITE_OTHER): Payer: Medicare Other | Admitting: Nurse Practitioner

## 2014-03-01 ENCOUNTER — Encounter: Payer: Self-pay | Admitting: Nurse Practitioner

## 2014-03-01 VITALS — BP 126/73 | HR 73 | Ht 64.0 in | Wt 212.5 lb

## 2014-03-01 DIAGNOSIS — R011 Cardiac murmur, unspecified: Secondary | ICD-10-CM

## 2014-03-01 DIAGNOSIS — K5790 Diverticulosis of intestine, part unspecified, without perforation or abscess without bleeding: Secondary | ICD-10-CM | POA: Insufficient documentation

## 2014-03-01 DIAGNOSIS — D649 Anemia, unspecified: Secondary | ICD-10-CM

## 2014-03-01 DIAGNOSIS — I4891 Unspecified atrial fibrillation: Secondary | ICD-10-CM

## 2014-03-01 DIAGNOSIS — K922 Gastrointestinal hemorrhage, unspecified: Secondary | ICD-10-CM

## 2014-03-01 DIAGNOSIS — I10 Essential (primary) hypertension: Secondary | ICD-10-CM

## 2014-03-01 NOTE — Progress Notes (Signed)
Patient Name: Julie AmmonsJoanne S [name] Date of Encounter: 03/01/2014  Primary Care Provider:  Darnelle BosSBORNE,JAMES CHARLES, MD Primary Cardiologist:  Concha Se. Gollan, MD (previously Catalina GravelJ. Varanasi, MD)  Patient Profile  79 y/o female with a h/o permanent afib on eliquis who was [facility] G[facility] and presents for f/u today.  Problem List   Past Medical History  Diagnosis Date  . Personal history of colonic polyps   . Chronic a-fib     a. CHA2DS2VASc = 5 (HTN, age > 2075, DM2, female);  b. Eliquis d/c'd 4/015 in setting of BRBPR/GIB;  c. Rate-control w/ BB.  . Type II or unspecified type diabetes mellitus without mention of complication, not stated as uncontrolled   . Obesity, unspecified   . Hyperlipidemia   . HTN (hypertension)   . Cellulitis and abscess of leg, except foot   . History of pneumonia   . Plantar fascial fibromatosis   . Diverticulosis   . RBBB   . GIB (gastrointestinal bleeding)     a. 02/2014 BRBPR;  b. 02/2014 Colonoscopy/EGD: diverticulosis with polys/polypectomy - ascending colon, Gastritis on EDG.  . Gastritis     a. 02/2014 EGD.  Marland Kitchen. Anemia     a. 02/2014 in setting of GIB req 3U PRBC's.   History reviewed. No pertinent past surgical history.  Allergies  Allergies  Allergen Reactions  . Diltiazem Hcl     REACTION: SOB/swelling    HPI  79 y/o female with a prior h/o permanent rate-controlled atrial fibrillation.  She has been anticoagulated with eliquis.  Earlier this month, [facility] and [facility]BPR.  Eliquis was held and she required 3 U PRBC's during her admission.  She was seen by GI and underwent EGD and colonoscopy showing gastritis and diverticulosis with colon polyps req polypectomy.  She was placed on prilosec and H/H remained stable.  Since d/c, she has noted dark stools but no BRBPR. She does take iron daily.  Her strength has been steadily improving and she says that she feels so good now, she's certain that she must having slow GI  blood loss for some time.  She remains off of eliquis at this time and is interested in resuming it as soon as GI says it's ok.  She denies chest pain, palpitations, dyspnea, pnd, orthopnea, n, v, dizziness, syncope, edema, weight gain, or early satiety.   Home Medications  Prior to Admission medications   Medication Sig Start Date End Date Taking? Authorizing Provider  albuterol (PROVENTIL HFA) 108 (90 BASE) MCG/ACT inhaler Inhale 2 puffs into the lungs every 6 (six) hours as needed.     Yes Historical Provider, MD  doxazosin (CARDURA) 8 MG tablet Take 1/2 daily   Yes Historical Provider, MD  ferrous sulfate 325 (65 FE) MG tablet Take 325 mg by mouth daily with breakfast.   Yes Historical Provider, MD  fluticasone (FLONASE) 50 MCG/ACT nasal spray Place 2 sprays into the nose 2 (two) times daily.  12/25/11  Yes Historical Provider, MD  furosemide (LASIX) 40 MG tablet Take 40 mg by mouth 2 (two) times daily.    Yes Historical Provider, MD  metoprolol (LOPRESSOR) 100 MG tablet 1 by mouth twice daily 12/20/11  Yes Historical Provider, MD  potassium chloride (KLOR-CON) 10 MEQ CR tablet Take 20 mEq by mouth daily.    Yes Historical Provider, MD  simvastatin (ZOCOR) 40 MG tablet Take 40 mg by mouth at bedtime.     Yes Historical  Provider, MD    Review of Systems  Strength has been improving steadily.  She denies chest pain, palpitations, dyspnea, pnd, orthopnea, n, v, dizziness, syncope, edema, weight gain, or early satiety. All other systems reviewed and are otherwise negative except as noted above.  Physical Exam  Blood pressure 126/73, pulse 73, height 5\' 4"  (1.626 m), weight 212 lb 8 oz (96.389 kg).  General: Pleasant, NAD Psych: Normal affect. Neuro: Alert and oriented X 3. Moves all extremities spontaneously. HEENT: Normal  Neck: Supple without bruits or JVD. Lungs:  Resp regular and unlabored, CTA. Heart: RRR no s3, s4, 2/6 sem rusb. Abdomen: Soft, non-tender, non-distended, BS + x 4.    Extremities: No clubbing, cyanosis.  Chronic venous stasis changes with trace bilat LEE. DP/PT/Radials 2+ and equal bilaterally.  Accessory Clinical Findings  ECG - afib, 73, rbbb, no acute st/t changes.  Assessment & Plan  1.  Permanent Afib:  Pt is in stable, rate-controlled afib. She recently had to come off of eliquis in the setting of acute GIB.  Her CHA2DS2VASc = 5.  She wishes to resume eliquis as soon as GI says it is ok.  She has GI f/u on 5/7.  We will plan to see her back on either 5/7 or 5/8.  Provided that GI feels that it is safe to resume anticoagulation @ that point, we will plan to start eliquis 2.5mg  bid.  If she does not bleed on that dose, we will then increase it to 5mg  bid as previously prescribed.  We had a long discussion about weighing the risk vs benefits of anticoagulation.   2.  GIB/Diverticulosis/Gastritis/ABL Anemia:  S/p recent admission for GIB with 3 u PRBC's during admission.  Will check a CBC today to assess for stability.  She reports feeling markedly better.  She has f/u with GI on 5/7.  Eliquis on hold until she is cleared to resume by GI with plan as above.  3.  HTN:  Stable.  4.  DM: followed by primary care.  5.  Systolic Murmur:  F/u echo.  6.  Disposition:  F/u in 2 wks after GI f/u to discuss eliquis resumption.  Ok Anishristopher R Sir Mallis, NP 03/01/2014, 4:21 PM

## 2014-03-01 NOTE — Patient Instructions (Addendum)
Your physician recommends that you have lab work today: CBC  Your physician has requested that you have an echocardiogram. Echocardiography is a painless test that uses sound waves to create images of your heart. It provides your doctor with information about the size and shape of your heart and how well your heart's chambers and valves are working. This procedure takes approximately one hour. There are no restrictions for this procedure.   Your physician recommends that you schedule a follow-up appointment in:  Please see Dr. Mariah MillingGollan May 7 or 8 to discuss Eliquis

## 2014-03-02 LAB — CBC WITH DIFFERENTIAL
BASOS ABS: 0.1 10*3/uL (ref 0.0–0.2)
BASOS: 1 %
EOS ABS: 0.4 10*3/uL (ref 0.0–0.4)
EOS: 7 %
HEMATOCRIT: 30 % — AB (ref 34.0–46.6)
HEMOGLOBIN: 10 g/dL — AB (ref 11.1–15.9)
IMMATURE GRANULOCYTES: 0 %
Immature Grans (Abs): 0 10*3/uL (ref 0.0–0.1)
LYMPHS ABS: 1.5 10*3/uL (ref 0.7–3.1)
LYMPHS: 26 %
MCH: 32.6 pg (ref 26.6–33.0)
MCHC: 33.3 g/dL (ref 31.5–35.7)
MCV: 98 fL — ABNORMAL HIGH (ref 79–97)
MONOCYTES: 11 %
MONOS ABS: 0.7 10*3/uL (ref 0.1–0.9)
NEUTROS PCT: 55 %
Neutrophils Absolute: 3.2 10*3/uL (ref 1.4–7.0)
Platelets: 334 10*3/uL (ref 150–379)
RBC: 3.07 x10E6/uL — AB (ref 3.77–5.28)
RDW: 18.3 % — ABNORMAL HIGH (ref 12.3–15.4)
WBC: 6 10*3/uL (ref 3.4–10.8)

## 2014-03-09 ENCOUNTER — Other Ambulatory Visit: Payer: Self-pay | Admitting: Gastroenterology

## 2014-03-09 LAB — CBC WITH DIFFERENTIAL/PLATELET
BASOS ABS: 0.1 10*3/uL (ref 0.0–0.1)
Basophil %: 2.3 %
EOS ABS: 0.2 10*3/uL (ref 0.0–0.7)
EOS PCT: 3.8 %
HCT: 31.6 % — ABNORMAL LOW (ref 35.0–47.0)
HGB: 10.5 g/dL — ABNORMAL LOW (ref 12.0–16.0)
Lymphocyte #: 1.2 10*3/uL (ref 1.0–3.6)
Lymphocyte %: 21.2 %
MCH: 33.6 pg (ref 26.0–34.0)
MCHC: 33.2 g/dL (ref 32.0–36.0)
MCV: 101 fL — ABNORMAL HIGH (ref 80–100)
Monocyte #: 0.5 x10 3/mm (ref 0.2–0.9)
Monocyte %: 8.6 %
NEUTROS ABS: 3.5 10*3/uL (ref 1.4–6.5)
NEUTROS PCT: 64.1 %
Platelet: 271 10*3/uL (ref 150–440)
RBC: 3.13 10*6/uL — ABNORMAL LOW (ref 3.80–5.20)
RDW: 20.8 % — ABNORMAL HIGH (ref 11.5–14.5)
WBC: 5.5 10*3/uL (ref 3.6–11.0)

## 2014-03-10 ENCOUNTER — Other Ambulatory Visit: Payer: Self-pay

## 2014-03-10 ENCOUNTER — Other Ambulatory Visit (INDEPENDENT_AMBULATORY_CARE_PROVIDER_SITE_OTHER): Payer: Medicare Other

## 2014-03-10 DIAGNOSIS — I369 Nonrheumatic tricuspid valve disorder, unspecified: Secondary | ICD-10-CM

## 2014-03-10 DIAGNOSIS — R0989 Other specified symptoms and signs involving the circulatory and respiratory systems: Secondary | ICD-10-CM

## 2014-03-10 DIAGNOSIS — R0609 Other forms of dyspnea: Secondary | ICD-10-CM

## 2014-03-10 DIAGNOSIS — I059 Rheumatic mitral valve disease, unspecified: Secondary | ICD-10-CM

## 2014-03-10 DIAGNOSIS — I4891 Unspecified atrial fibrillation: Secondary | ICD-10-CM

## 2014-03-10 DIAGNOSIS — I359 Nonrheumatic aortic valve disorder, unspecified: Secondary | ICD-10-CM

## 2014-03-18 ENCOUNTER — Ambulatory Visit (INDEPENDENT_AMBULATORY_CARE_PROVIDER_SITE_OTHER): Payer: Medicare Other | Admitting: Cardiovascular Disease

## 2014-03-18 ENCOUNTER — Encounter: Payer: Self-pay | Admitting: Cardiovascular Disease

## 2014-03-18 VITALS — BP 120/80 | HR 63 | Ht 64.0 in | Wt 216.8 lb

## 2014-03-18 DIAGNOSIS — E785 Hyperlipidemia, unspecified: Secondary | ICD-10-CM

## 2014-03-18 DIAGNOSIS — R011 Cardiac murmur, unspecified: Secondary | ICD-10-CM

## 2014-03-18 DIAGNOSIS — I2789 Other specified pulmonary heart diseases: Secondary | ICD-10-CM

## 2014-03-18 DIAGNOSIS — I509 Heart failure, unspecified: Secondary | ICD-10-CM

## 2014-03-18 DIAGNOSIS — I4891 Unspecified atrial fibrillation: Secondary | ICD-10-CM

## 2014-03-18 DIAGNOSIS — I10 Essential (primary) hypertension: Secondary | ICD-10-CM

## 2014-03-18 DIAGNOSIS — I5032 Chronic diastolic (congestive) heart failure: Secondary | ICD-10-CM | POA: Insufficient documentation

## 2014-03-18 MED ORDER — APIXABAN 2.5 MG PO TABS: 2.5000 mg | ORAL_TABLET | Freq: Two times a day (BID) | ORAL | Status: DC

## 2014-03-18 NOTE — Assessment & Plan Note (Signed)
Recommended that she decrease her fluid intake, continue Lasix 40 mg twice a day. She has significant edema on today's visit. Would decrease her Lasix back to 40 mg daily once her edema has improved. Recent echocardiogram showing left ventricular systolic pressure greater than 50.

## 2014-03-18 NOTE — Assessment & Plan Note (Signed)
She has moderate to severe tricuspid valve regurgitation. This may improve with aggressive diuresis

## 2014-03-18 NOTE — Assessment & Plan Note (Signed)
Normal LV systolic function. Elevated right ventricular systolic pressures. Suggested Lasix 40 mg twice a day, decrease fluid intake

## 2014-03-18 NOTE — Assessment & Plan Note (Signed)
Blood pressure is well controlled on today's visit. No changes made to the medications. 

## 2014-03-18 NOTE — Patient Instructions (Addendum)
You are doing well.  Please start eliquis 2.5 mg twice a day Stop the eliquis for any blood in your bowels  Try to cut back on your fluid intake Stay on lasix twice a day until the leg swelling improves Then go back to lasix one a day  Please call us if you have new issues that need to be addressed before your next appt.  Your physician wants you to follow-up in: 1 month.

## 2014-03-18 NOTE — Assessment & Plan Note (Signed)
Encouraged her to stay on her simvastatin 

## 2014-03-18 NOTE — Assessment & Plan Note (Addendum)
After a long discussion (>30 minutes), she prefers to restart eliquis 2.5 mg twice a day. She does not have much interest in going back on a full 5 mg dosing. We have asked her to monitor her her bowel movements closely and stop the into coagulation for any signs of bleeding and to call our office.

## 2014-03-18 NOTE — Progress Notes (Signed)
Patient ID: [name], female    DOB: [birth year], 79 y.o.   Vernie AmmonsMRN: [id number]  HPI Comments: 79 y/o female with a prior h/o permanent rate-controlled atrial fibrillation.  She has been anticoagulated with eliquis 5 mg twice a day  [facility] 2015, [facility] and [facility]BPR.  Eliquis was held and she required 3 U PRBC's during her admission.  She was seen by GI and underwent EGD and colonoscopy showing gastritis and diverticulosis with colon polyps req polypectomy.  She was placed on prilosec and H/H remained stable.    Since d/c, she initially noted dark stools but no BRBPR. She does take iron daily. Bowel movements now normal color. She had a fall 6 days ago after she misstepped, leg give out under her, she hit the right side of her for head significant ecchymosis developing. She did not seek medical attention. She reports that it feels fine, mildly tender only for head.  No other falls. She feels that her balance is currently stable. She is again interested in restarting blood thinners. She is very afraid of having a stroke. Recently seen by GI this past week. She reports that they deferred on decision for blood thinners and said it was up to cardiology Patient reports that even if she develops a GI bleed, she would prefer to have blood transfusions rather than have a stroke  Recent echocardiogram 03/10/2014: - Left ventricle: Systolic function was normal. The estimated ejection fraction was in the range of 55% to 65%. Wall motion was normal; there were no regional wall   motion abnormalities. - Mitral valve: Mild to moderate regurgitation. - Left atrium: The atrium was mildly dilated. - Right atrium: The atrium was moderately dilated. - Tricuspid valve: Moderate-severe regurgitation. - Pulmonary arteries: PA peak pressure: 51mm Hg (S).   Problem List . Personal history of colonic polyps  . Chronic a-fib    a. CHA2DS2VASc = 5 (HTN, age > 3975, DM2, female);  b. Eliquis  d/c'd 4/015 in setting of BRBPR/GIB;  c. Rate-control w/ BB. . Type II or unspecified type diabetes mellitus without mention of complication, not stated as uncontrolled  . Obesity, unspecified  . Hyperlipidemia  . HTN (hypertension)  . Cellulitis and abscess of leg, except foot  . History of pneumonia  . Plantar fascial fibromatosis  . Diverticulosis  . RBBB  . GIB (gastrointestinal bleeding)    a. 02/2014 BRBPR;  b. 02/2014 Colonoscopy/EGD: diverticulosis with polys/polypectomy - ascending colon, Gastritis on EDG. . Gastritis    a. 02/2014 EGD. Marland Kitchen. Anemia    a. 02/2014 in setting of GIB req 3U PRBC's.     Outpatient Encounter Prescriptions as of 03/18/2014  Medication Sig  . albuterol (PROVENTIL HFA) 108 (90 BASE) MCG/ACT inhaler Inhale 2 puffs into the lungs every 6 (six) hours as needed.    . doxazosin (CARDURA) 8 MG tablet Take 1/2 daily  . ferrous sulfate 325 (65 FE) MG tablet Take 325 mg by mouth daily with breakfast.  . fluticasone (FLONASE) 50 MCG/ACT nasal spray Place 2 sprays into the nose 2 (two) times daily.   . furosemide (LASIX) 40 MG tablet Take 40 mg by mouth 2 (two) times daily.   . metoprolol (LOPRESSOR) 100 MG tablet 1 by mouth twice daily  . omeprazole (PRILOSEC) 20 MG capsule Take 20 mg by mouth daily.   . potassium chloride (KLOR-CON) 10 MEQ CR tablet Take 20 mEq by mouth daily.   . simvastatin (ZOCOR)  40 MG tablet Take 40 mg by mouth at bedtime.       Review of Systems  Constitutional: Negative.   HENT: Negative.        Bruising on her right for head, cheek, nose  Eyes: Negative.   Respiratory: Negative.   Cardiovascular: Negative.   Gastrointestinal: Negative.   Endocrine: Negative.   Musculoskeletal: Negative.   Skin: Negative.   Allergic/Immunologic: Negative.   Neurological: Negative.   Hematological: Negative.   Psychiatric/Behavioral: Negative.   All other systems reviewed and are negative.   BP 120/80  Pulse 63  Ht 5\' 4"  (1.626 m)  Wt 216  lb 12 oz (98.317 kg)  BMI 37.19 kg/m2  Physical Exam  Nursing note and vitals reviewed. Constitutional: She is oriented to person, place, and time. She appears well-developed and well-nourished.  Significant ecchymosis noted on her right for head, right cheek, bridge of her nose.   HENT:  Head: Normocephalic.  Nose: Nose normal.  Mouth/Throat: Oropharynx is clear and moist.  Eyes: Conjunctivae are normal. Pupils are equal, round, and reactive to light.  Neck: Normal range of motion. Neck supple. No JVD present.  Cardiovascular: Normal rate, regular rhythm, S1 normal, S2 normal, normal heart sounds and intact distal pulses.  Exam reveals no gallop and no friction rub.   No murmur heard. 1+ pitting edema to the upper shin bilaterally  Pulmonary/Chest: Effort normal and breath sounds normal. No respiratory distress. She has no wheezes. She has no rales. She exhibits no tenderness.  Abdominal: Soft. Bowel sounds are normal. She exhibits no distension. There is no tenderness.  Musculoskeletal: Normal range of motion. She exhibits no edema and no tenderness.  Lymphadenopathy:    She has no cervical adenopathy.  Neurological: She is alert and oriented to person, place, and time. Coordination normal.  Skin: Skin is warm and dry. No rash noted. No erythema.  Psychiatric: She has a normal mood and affect. Her behavior is normal. Judgment and thought content normal.    Assessment and Plan

## 2014-03-23 ENCOUNTER — Other Ambulatory Visit: Payer: Self-pay | Admitting: Interventional Cardiology

## 2014-03-23 NOTE — Telephone Encounter (Signed)
Pt no longer sees Dr. Eldridge DaceVaranasi. Pt see Dr. Mariah MillingGollan.

## 2014-03-23 NOTE — Telephone Encounter (Signed)
Rx has Dr Eldridge DaceVaranasi, but not sure if this is your patient. Thanks, MI

## 2014-03-24 DIAGNOSIS — K922 Gastrointestinal hemorrhage, unspecified: Secondary | ICD-10-CM

## 2014-03-24 DIAGNOSIS — R011 Cardiac murmur, unspecified: Secondary | ICD-10-CM

## 2014-03-24 DIAGNOSIS — I10 Essential (primary) hypertension: Secondary | ICD-10-CM

## 2014-03-24 DIAGNOSIS — D649 Anemia, unspecified: Secondary | ICD-10-CM

## 2014-03-30 ENCOUNTER — Ambulatory Visit: Payer: Medicare Other | Admitting: Interventional Cardiology

## 2014-04-27 ENCOUNTER — Ambulatory Visit: Payer: Medicare Other | Admitting: Cardiovascular Disease

## 2014-04-28 ENCOUNTER — Ambulatory Visit (INDEPENDENT_AMBULATORY_CARE_PROVIDER_SITE_OTHER): Payer: Medicare Other | Admitting: Cardiovascular Disease

## 2014-04-28 ENCOUNTER — Encounter: Payer: Self-pay | Admitting: Cardiovascular Disease

## 2014-04-28 VITALS — BP 122/70 | HR 62 | Ht 64.0 in | Wt 213.5 lb

## 2014-04-28 DIAGNOSIS — I509 Heart failure, unspecified: Secondary | ICD-10-CM

## 2014-04-28 DIAGNOSIS — I4891 Unspecified atrial fibrillation: Secondary | ICD-10-CM

## 2014-04-28 DIAGNOSIS — E785 Hyperlipidemia, unspecified: Secondary | ICD-10-CM

## 2014-04-28 DIAGNOSIS — K5791 Diverticulosis of intestine, part unspecified, without perforation or abscess with bleeding: Secondary | ICD-10-CM

## 2014-04-28 DIAGNOSIS — K5731 Diverticulosis of large intestine without perforation or abscess with bleeding: Secondary | ICD-10-CM

## 2014-04-28 DIAGNOSIS — I2789 Other specified pulmonary heart diseases: Secondary | ICD-10-CM

## 2014-04-28 DIAGNOSIS — I5032 Chronic diastolic (congestive) heart failure: Secondary | ICD-10-CM

## 2014-04-28 NOTE — Assessment & Plan Note (Signed)
She has been in contact with Dr. Marlowe KaysEly's office. Recent EGD and colonoscopy

## 2014-04-28 NOTE — Assessment & Plan Note (Signed)
Recommended that she continue Lasix as needed for leg edema or shortness of breath

## 2014-04-28 NOTE — Patient Instructions (Signed)
You are doing well. No medication changes were made.  Please call us if you have new issues that need to be addressed before your next appt.  Your physician wants you to follow-up in: 6 months.  You will receive a reminder letter in the mail two months in advance. If you don't receive a letter, please call our office to schedule the follow-up appointment.   

## 2014-04-28 NOTE — Assessment & Plan Note (Signed)
He appears relatively euvolemic. Weight is down 3 pounds

## 2014-04-28 NOTE — Assessment & Plan Note (Signed)
Encouraged her to stay on her simvastatin 

## 2014-04-28 NOTE — Progress Notes (Signed)
Patient ID: [name], female    DOB: 1934-12-10, 79 y.o.   MRN: [id number]  HPI Comments: 79 y/o female with a prior h/o permanent rate-controlled atrial fibrillation.  She has been anticoagulated with eliquis 5 mg twice a day  she had GI bleed April 2015 found to have gastritis, diverticulosis, polyps requiring polypectomy. Since then all anticoagulation has been held  Underlies clinic visit we suggested she restart 2.5 mg twice a day In followup today, she has not started this yet. She had one day of blood tinged stool. She called her GI physician who suggested she hold off on her anticoagulation for now.  Weight is down 3 pounds from her prior clinic visit. She denies any swelling. She's been taking Lasix when necessary, more frequent over the past several weeks  [facility], [facility] and [facility].  Eliquis was held and she required 3 U PRBC's during her admission.  She was seen by GI and underwent EGD and colonoscopy showing gastritis and diverticulosis with colon polyps req polypectomy.  She was placed on prilosec and H/H remained stable.    EKG shows atrial fibrillation with rate 62 beats per minute, right bundle branch block  Recent echocardiogram 03/10/2014: - Left ventricle: Systolic function was normal. The estimated ejection fraction was in the range of 55% to 65%. Wall motion was normal; there were no regional wall   motion abnormalities. - Mitral valve: Mild to moderate regurgitation. - Left atrium: The atrium was mildly dilated. - Right atrium: The atrium was moderately dilated. - Tricuspid valve: Moderate-severe regurgitation. - Pulmonary arteries: PA peak pressure: 51mm Hg (S).   Problem List . Personal history of colonic polyps  . Chronic a-fib    a. CHA2DS2VASc = 5 (HTN, age > 3275, DM2, female);  b. Eliquis d/c'd 4/015 in setting of BRBPR/GIB;  c. Rate-control w/ BB. . Type II or unspecified type diabetes mellitus without mention of  complication, not stated as uncontrolled  . Obesity, unspecified  . Hyperlipidemia  . HTN (hypertension)  . Cellulitis and abscess of leg, except foot  . History of pneumonia  . Plantar fascial fibromatosis  . Diverticulosis  . RBBB  . GIB (gastrointestinal bleeding)    a. 02/2014 BRBPR;  b. 02/2014 Colonoscopy/EGD: diverticulosis with polys/polypectomy - ascending colon, Gastritis on EDG. . Gastritis    a. 02/2014 EGD. Marland Kitchen. Anemia    a. 02/2014 in setting of GIB req 3U PRBC's.     Outpatient Encounter Prescriptions as of 04/28/2014  Medication Sig  . albuterol (PROVENTIL HFA) 108 (90 BASE) MCG/ACT inhaler Inhale 2 puffs into the lungs every 6 (six) hours as needed.    Marland Kitchen. apixaban (ELIQUIS) 2.5 MG TABS tablet Take 1 tablet (2.5 mg total) by mouth 2 (two) times daily.  Marland Kitchen. doxazosin (CARDURA) 8 MG tablet Take 1/2 daily  . ferrous sulfate 325 (65 FE) MG tablet Take 325 mg by mouth daily with breakfast.  . fluticasone (FLONASE) 50 MCG/ACT nasal spray Place 2 sprays into the nose 2 (two) times daily.   . furosemide (LASIX) 40 MG tablet Take 40 mg by mouth daily as needed.   . metoprolol (LOPRESSOR) 100 MG tablet 1 by mouth twice daily  . omeprazole (PRILOSEC) 20 MG capsule Take 20 mg by mouth daily.   . potassium chloride (KLOR-CON) 10 MEQ CR tablet Take 20 mEq by mouth daily.   . simvastatin (ZOCOR) 20 MG tablet Take 20 mg by mouth daily.  Review of Systems  Constitutional: Negative.   HENT: Negative.   Eyes: Negative.   Respiratory: Negative.   Cardiovascular: Negative.   Gastrointestinal: Negative.   Endocrine: Negative.   Musculoskeletal: Negative.   Skin: Negative.   Allergic/Immunologic: Negative.   Neurological: Negative.   Hematological: Negative.   Psychiatric/Behavioral: Negative.   All other systems reviewed and are negative.   BP 122/70  Pulse 62  Ht 5\' 4"  (1.626 m)  Wt 213 lb 8 oz (96.843 kg)  BMI 36.63 kg/m2  Physical Exam  Nursing note and vitals  reviewed. Constitutional: She is oriented to person, place, and time. She appears well-developed and well-nourished.  Significant ecchymosis noted on her right for head, right cheek, bridge of her nose.   HENT:  Head: Normocephalic.  Nose: Nose normal.  Mouth/Throat: Oropharynx is clear and moist.  Eyes: Conjunctivae are normal. Pupils are equal, round, and reactive to light.  Neck: Normal range of motion. Neck supple. No JVD present.  Cardiovascular: Normal rate, regular rhythm, S1 normal, S2 normal, normal heart sounds and intact distal pulses.  Exam reveals no gallop and no friction rub.   No murmur heard. 1+ pitting edema to the upper shin bilaterally  Pulmonary/Chest: Effort normal and breath sounds normal. No respiratory distress. She has no wheezes. She has no rales. She exhibits no tenderness.  Abdominal: Soft. Bowel sounds are normal. She exhibits no distension. There is no tenderness.  Musculoskeletal: Normal range of motion. She exhibits no edema and no tenderness.  Lymphadenopathy:    She has no cervical adenopathy.  Neurological: She is alert and oriented to person, place, and time. Coordination normal.  Skin: Skin is warm and dry. No rash noted. No erythema.  Psychiatric: She has a normal mood and affect. Her behavior is normal. Judgment and thought content normal.    Assessment and Plan

## 2014-04-28 NOTE — Assessment & Plan Note (Signed)
Chronic atrial fibrillation. We have suggested she try to slowly restart her anticoagulation. We have had numerous conversations with her about this in the past and she does not want a stroke. She will start eliquis 2.5 mg daily with slow titration up to twice a day dosing. She does not want to retry 5 mg twice a day as she is afraid of recurrent GI bleed.

## 2014-05-11 ENCOUNTER — Encounter: Payer: Self-pay | Admitting: *Deleted

## 2014-07-13 ENCOUNTER — Other Ambulatory Visit: Payer: Self-pay | Admitting: Urgent Care

## 2014-07-13 LAB — CBC WITH DIFFERENTIAL/PLATELET
Basophil #: 0.1 10*3/uL (ref 0.0–0.1)
Basophil %: 2 %
EOS PCT: 5 %
Eosinophil #: 0.2 10*3/uL (ref 0.0–0.7)
HCT: 34.5 % — ABNORMAL LOW (ref 35.0–47.0)
HGB: 11.1 g/dL — ABNORMAL LOW (ref 12.0–16.0)
LYMPHS PCT: 29.2 %
Lymphocyte #: 1.3 10*3/uL (ref 1.0–3.6)
MCH: 32.1 pg (ref 26.0–34.0)
MCHC: 32.3 g/dL (ref 32.0–36.0)
MCV: 99 fL (ref 80–100)
MONO ABS: 0.4 x10 3/mm (ref 0.2–0.9)
Monocyte %: 8.2 %
NEUTROS PCT: 55.6 %
Neutrophil #: 2.4 10*3/uL (ref 1.4–6.5)
Platelet: 234 10*3/uL (ref 150–440)
RBC: 3.47 10*6/uL — AB (ref 3.80–5.20)
RDW: 21.1 % — ABNORMAL HIGH (ref 11.5–14.5)
WBC: 4.3 10*3/uL (ref 3.6–11.0)

## 2014-07-29 ENCOUNTER — Other Ambulatory Visit: Payer: Medicare Other

## 2014-08-01 ENCOUNTER — Ambulatory Visit (INDEPENDENT_AMBULATORY_CARE_PROVIDER_SITE_OTHER): Payer: Medicare Other | Admitting: *Deleted

## 2014-08-01 DIAGNOSIS — I4891 Unspecified atrial fibrillation: Secondary | ICD-10-CM

## 2014-08-02 LAB — CBC
HEMATOCRIT: 34.5 % (ref 34.0–46.6)
HEMOGLOBIN: 11.7 g/dL (ref 11.1–15.9)
MCH: 32 pg (ref 26.6–33.0)
MCHC: 33.9 g/dL (ref 31.5–35.7)
MCV: 94 fL (ref 79–97)
Platelets: 294 10*3/uL (ref 150–379)
RBC: 3.66 x10E6/uL — ABNORMAL LOW (ref 3.77–5.28)
RDW: 19.8 % — ABNORMAL HIGH (ref 12.3–15.4)
WBC: 5 10*3/uL (ref 3.4–10.8)

## 2014-08-02 LAB — BASIC METABOLIC PANEL
BUN / CREAT RATIO: 17 (ref 11–26)
BUN: 16 mg/dL (ref 8–27)
CO2: 21 mmol/L (ref 18–29)
CREATININE: 0.96 mg/dL (ref 0.57–1.00)
Calcium: 9.2 mg/dL (ref 8.7–10.3)
Chloride: 99 mmol/L (ref 97–108)
GFR calc non Af Amer: 57 mL/min/{1.73_m2} — ABNORMAL LOW (ref >59)
GFR, EST AFRICAN AMERICAN: 66 mL/min/{1.73_m2} (ref >59)
Glucose: 89 mg/dL (ref 65–99)
Potassium: 3.8 mmol/L (ref 3.5–5.2)
SODIUM: 138 mmol/L (ref 134–144)

## 2014-10-25 ENCOUNTER — Ambulatory Visit (INDEPENDENT_AMBULATORY_CARE_PROVIDER_SITE_OTHER): Payer: Medicare Other | Admitting: Cardiovascular Disease

## 2014-10-25 ENCOUNTER — Encounter: Payer: Self-pay | Admitting: Cardiovascular Disease

## 2014-10-25 VITALS — BP 110/60 | HR 73 | Ht 64.0 in | Wt 207.0 lb

## 2014-10-25 DIAGNOSIS — R0602 Shortness of breath: Secondary | ICD-10-CM

## 2014-10-25 DIAGNOSIS — E669 Obesity, unspecified: Secondary | ICD-10-CM

## 2014-10-25 DIAGNOSIS — R2681 Unsteadiness on feet: Secondary | ICD-10-CM

## 2014-10-25 DIAGNOSIS — E785 Hyperlipidemia, unspecified: Secondary | ICD-10-CM

## 2014-10-25 DIAGNOSIS — I482 Chronic atrial fibrillation, unspecified: Secondary | ICD-10-CM

## 2014-10-25 DIAGNOSIS — I5032 Chronic diastolic (congestive) heart failure: Secondary | ICD-10-CM

## 2014-10-25 DIAGNOSIS — I10 Essential (primary) hypertension: Secondary | ICD-10-CM

## 2014-10-25 DIAGNOSIS — K5791 Diverticulosis of intestine, part unspecified, without perforation or abscess with bleeding: Secondary | ICD-10-CM

## 2014-10-25 MED ORDER — DOXAZOSIN MESYLATE 4 MG PO TABS: 4.0000 mg | ORAL_TABLET | Freq: Every day | ORAL | Status: DC

## 2014-10-25 NOTE — Progress Notes (Signed)
Patient ID: Julie AmmonsJoanne S Sebesta, female    DOB: [birth year], 79 y.o.   MRN: [id number]  HPI Comments: 79 y/o female with a prior h/o permanent rate-controlled atrial fibrillation, anticoagulated with eliquis 5 mg twice a day  she had GI bleed April 2015 found to have gastritis, diverticulosis, polyps requiring polypectomy. Since then all anticoagulation has been held She presents today for discussion of her atrial fibrillation  On pre-does office visit, she was going to restart anticoagulation with eliquis  2.5 mg twice a day On further discussion today, she has not restarted the medication. She did take 1 or 2 pills but then felt a fullness in her abdomen and she was concerned and stopped the medication No signs of bleeding On further discussion today, she has indicated that she has always wanted to restart the blood thinner but just has not done so. Reason for not starting is not clear. She's been off anticoagulation since April 2015 She denies having any shortness of breath or leg edema  Recent lab work showing hemoglobin A1c 5.7, glucose 117, total cholesterol 130, LDL 85 EKG on today's visit shows atrial fibrillation with ventricular rate 73 bpm, rare PVC, nonspecific ST abnormality  In April 2015, [facility] with fatigue and BRBPR.  Eliquis was held and she required 3 U PRBC's during her admission.  She was seen by GI and underwent EGD and colonoscopy showing gastritis and diverticulosis with colon polyps req polypectomy.  She was placed on prilosec and H/H remained stable.     Recent echocardiogram 03/10/2014: - Left ventricle: Systolic function was normal. The estimated ejection fraction was in the range of 55% to 65%. Wall motion was normal; there were no regional wall   motion abnormalities. - Mitral valve: Mild to moderate regurgitation. - Left atrium: The atrium was mildly dilated. - Right atrium: The atrium was moderately dilated. - Tricuspid valve: Moderate-severe  regurgitation. - Pulmonary arteries: PA peak pressure: 51mm Hg (S).   Problem List . Personal history of colonic polyps  . Chronic a-fib    a. CHA2DS2VASc = 5 (HTN, age > 7275, DM2, female);  b. Eliquis d/c'd 4/015 in setting of BRBPR/GIB;  c. Rate-control w/ BB. . Type II or unspecified type diabetes mellitus without mention of complication, not stated as uncontrolled  . Obesity, unspecified  . Hyperlipidemia  . HTN (hypertension)  . Cellulitis and abscess of leg, except foot  . History of pneumonia  . Plantar fascial fibromatosis  . Diverticulosis  . RBBB  . GIB (gastrointestinal bleeding)    a. 02/2014 BRBPR;  b. 02/2014 Colonoscopy/EGD: diverticulosis with polys/polypectomy - ascending colon, Gastritis on EDG. . Gastritis    a. 02/2014 EGD. Marland Kitchen. Anemia    a. 02/2014 in setting of GIB req 3U PRBC's.     Allergies  Allergen Reactions  . Diltiazem Hcl     REACTION: SOB/swelling    Outpatient Encounter Prescriptions as of 10/25/2014  Medication Sig  . albuterol (PROVENTIL HFA) 108 (90 BASE) MCG/ACT inhaler Inhale 2 puffs into the lungs every 6 (six) hours as needed.    Marland Kitchen. apixaban (ELIQUIS) 2.5 MG TABS tablet Take 1 tablet (2.5 mg total) by mouth 2 (two) times daily.  Marland Kitchen. doxazosin (CARDURA) 8 MG tablet Take 1/2 daily  . ferrous sulfate 325 (65 FE) MG tablet Take 325 mg by mouth daily with breakfast.  . fluticasone (FLONASE) 50 MCG/ACT nasal spray Place 2 sprays into the nose 2 (two) times daily.   .Marland Kitchen  furosemide (LASIX) 40 MG tablet Take 40 mg by mouth daily as needed.   . metoprolol (LOPRESSOR) 100 MG tablet 1 by mouth twice daily  . omeprazole (PRILOSEC) 20 MG capsule Take 20 mg by mouth daily.   . potassium chloride (KLOR-CON) 10 MEQ CR tablet Take 20 mEq by mouth daily.   . simvastatin (ZOCOR) 20 MG tablet Take 20 mg by mouth daily.    Past Medical History  Diagnosis Date  . Personal history of colonic polyps   . Chronic a-fib     a. CHA2DS2VASc = 5 (HTN, age > 4675, DM2,  female);  b. Eliquis d/c'd 4/015 in setting of BRBPR/GIB;  c. Rate-control w/ BB.  . Type II or unspecified type diabetes mellitus without mention of complication, not stated as uncontrolled   . Obesity, unspecified   . Hyperlipidemia   . HTN (hypertension)   . Cellulitis and abscess of leg, except foot   . History of pneumonia   . Plantar fascial fibromatosis   . Diverticulosis   . RBBB   . GIB (gastrointestinal bleeding)     a. 02/2014 BRBPR;  b. 02/2014 Colonoscopy/EGD: diverticulosis with polys/polypectomy - ascending colon, Gastritis on EDG.  . Gastritis     a. 02/2014 EGD.  Marland Kitchen. Anemia     a. 02/2014 in setting of GIB req 3U PRBC's.    History reviewed. No pertinent past surgical history.  Social History  reports that she has never smoked. She has never used smokeless tobacco. She reports that she drinks alcohol. She reports that she does not use illicit drugs.  Family History family history includes Asthma in her father; Emphysema in her father; Heart attack in her father; Hypertension in her mother.   Review of Systems  Respiratory: Negative.   Cardiovascular: Negative.   Gastrointestinal: Negative.   Musculoskeletal: Negative.   Neurological: Negative.   Hematological: Negative.   Psychiatric/Behavioral: Negative.   All other systems reviewed and are negative.   BP 110/60 mmHg  Pulse 73  Ht 5\' 4"  (1.626 m)  Wt 207 lb (93.895 kg)  BMI 35.51 kg/m2  Physical Exam  Constitutional: She is oriented to person, place, and time. She appears well-developed and well-nourished.  Presenting today in a wheelchair  HENT:  Head: Normocephalic.  Nose: Nose normal.  Mouth/Throat: Oropharynx is clear and moist.  Eyes: Conjunctivae are normal. Pupils are equal, round, and reactive to light.  Neck: Normal range of motion. Neck supple. No JVD present.  Cardiovascular: Normal rate, S1 normal, S2 normal, normal heart sounds and intact distal pulses.  An irregularly irregular rhythm  present. Exam reveals no gallop and no friction rub.   No murmur heard. Pulmonary/Chest: Effort normal and breath sounds normal. No respiratory distress. She has no wheezes. She has no rales. She exhibits no tenderness.  Abdominal: Soft. Bowel sounds are normal. She exhibits no distension. There is no tenderness.  Musculoskeletal: Normal range of motion. She exhibits no edema or tenderness.  Lymphadenopathy:    She has no cervical adenopathy.  Neurological: She is alert and oriented to person, place, and time. Coordination normal.  Skin: Skin is warm and dry. No rash noted. No erythema.  Psychiatric: She has a normal mood and affect. Her behavior is normal. Judgment and thought content normal.    Assessment and Plan  Nursing note and vitals reviewed.

## 2014-10-25 NOTE — Patient Instructions (Signed)
You are doing well. No medication changes were made.  Consider restarting eliquis 1/2 pill twice a day  Please call us if you have new issues that need to be addressed before your next appt.  Your physician wants you to follow-up in: 6 months.  You will receive a reminder letter in the mail two months in advance. If you don't receive a letter, please call our office to schedule the follow-up appointment.

## 2014-10-26 DIAGNOSIS — R2681 Unsteadiness on feet: Secondary | ICD-10-CM | POA: Insufficient documentation

## 2014-10-26 NOTE — Assessment & Plan Note (Signed)
No recent falls. Recommended regular walking program

## 2014-10-26 NOTE — Assessment & Plan Note (Signed)
Blood pressure is well controlled on today's visit. No changes made to the medications. 

## 2014-10-26 NOTE — Assessment & Plan Note (Signed)
No significant GI bleed since April 2015. Encouraged her to restart low-dose anticoagulation as detailed above

## 2014-10-26 NOTE — Assessment & Plan Note (Addendum)
We have again discussed at length whether she should restart anticoagulation. Given no bleeding in the past 8 months, recommended she start low-dose eliquis 2.5 mill grams twice a day. She indicated many months ago that she wanted to restart the blood thinner but she has not done so. Unclear of the barriers to restarting her medication. Unclear if there is medication confusion or some dementia. We will let her make this decision on her own. No family present with her today

## 2014-10-26 NOTE — Assessment & Plan Note (Signed)
We have encouraged continued exercise, careful diet management in an effort to lose weight. 

## 2014-10-26 NOTE — Assessment & Plan Note (Signed)
Encouraged her to stay on her simvastatin. Most recent lipid panel not available

## 2014-10-26 NOTE — Assessment & Plan Note (Signed)
She appears relatively euvolemic on today's visit. Denies any shortness of breath. We'll continue Lasix 40 mg as needed for leg edema or shortness of breath

## 2014-11-24 ENCOUNTER — Telehealth: Payer: Self-pay

## 2014-11-24 MED ORDER — METOPROLOL TARTRATE 100 MG PO TABS: 100.0000 mg | ORAL_TABLET | Freq: Two times a day (BID) | ORAL | Status: DC

## 2014-11-24 NOTE — Telephone Encounter (Signed)
Pt states she thinks she has her medications "mixed up". Wants Dr. Mariah Millinggollan to start ordering her Metoprolol.

## 2014-11-24 NOTE — Telephone Encounter (Signed)
Spoke w/ pt.  She would like for our office to send in refills for her metoprolol. 90 day supply sent to [facility].  Asked her to call back w/ any further questions or concerns.

## 2015-01-27 ENCOUNTER — Other Ambulatory Visit: Payer: Medicare Other

## 2015-02-26 ENCOUNTER — Other Ambulatory Visit: Payer: Self-pay | Admitting: Cardiovascular Disease

## 2015-03-04 NOTE — Consult Note (Signed)
PATIENT NAME:  [name], BASIC MR#:  [id number] DATE OF BIRTH:  1934-11-23  DATE OF CONSULTATION:  02/21/2014  REFERRING PHYSICIAN:  Dr. Bridgett Larsson. CONSULTING PHYSICIAN:  Andria Meuse, NP  PRIMARY CARE PHYSICIAN:  Dr. Nehemiah Settle in [place].   CONSULTING GASTROENTEROLOGIST: Dr. Lucilla Lame.   REASON FOR CONSULTATION:  GI bleed.  HISTORY OF PRESENT ILLNESS:  [name] is a pleasant 80 year old Caucasian female who was in her usual state of health until 5:00 a.m. yesterday when she developed large volume hematochezia. She is on Eliquis and has been for the last year for her atrial fibrillation. The bleeding did stop around 3:00 p.m. yesterday. She had seen some scant hematochezia a few weeks prior to this. She also noted dark, tarry stools (melena) yesterday as well. Her hemoglobin dropped from 8.9 down to 6.9. She was given FEIBA. She occasionally has heartburn, but denies any other abdominal pain. She did have some nausea and dry heaves yesterday. She denies any fever or chills. Denies any diarrhea. She did have some constipation prior to her hospital admission. She is due for a colonoscopy with a history of polyps over 5 years ago on last colonoscopy by Dr. Sammuel Cooper in [place]. She denies any weight loss. She denies any history of blood transfusions, but does have history of chronic anemia.   PAST MEDICAL AND SURGICAL HISTORY: Chronic anemia, hyperlipidemia, atrial fibrillation, diverticulosis, colon polyps on last colonoscopy by Dr. Sammuel Cooper over 5 years ago, diabetes mellitus.   MEDICATIONS PRIOR TO ADMISSION:  Eliquis 5 mg b.i.d., ferrous sulfate 325 mg daily, fluticasone 50 mcg inhalation spray 2 sprays b.i.d. p.r.n., furosemide 40 mg 2 tablets in the morning, metoprolol 100 mg b.i.d., potassium chloride 10 mEq extended-release 3 tablets daily, simvastatin 40 mg daily.   ALLERGIES: THE PATIENT HAS AN ALLERGY TO A HEART MEDICATION SHE CANNOT REMEMBER THE NAME, BUT IT CAUSED HER TO BE JITTERY.    FAMILY HISTORY: Positive for father with polyps. Father deceased secondary to MI. Mother had circulation problems.   SOCIAL HISTORY: She is a widow. She lives alone. She does not have any children. She denies any tobacco or illicit drug use. She rarely consumes wine. She is a retired [employer] engineer. She states her friend, Judeen Hammans, who is like a daughter, is who she would like to make medical decisions, although she does have a power of attorney, although we do not have a copy of this on the chart.   REVIEW OF SYSTEMS: See HPI, otherwise negative 12 point review of systems.     PHYSICAL EXAMINATION: VITAL SIGNS: Temperature 99.1, pulse 104, respirations 21, blood pressure 120/44, O2 sat 100% on room air.  GENERAL: She is a well-developed, well-nourished Caucasian female in no acute distress. She is accompanied by her sister-in-law today.  HEENT: Sclerae clear, anicteric. Conjunctivae pink. Oropharynx pink and moist without any lesions.  NECK: Supple without mass or thyromegaly.  CHEST: Heart rate is irregularly irregular.  LUNGS: Clear to auscultation bilaterally.  ABDOMEN: With positive bowel sounds x 4. No bruits auscultated. Abdomen is soft, nontender, nondistended without palpable mass or hepatosplenomegaly. No rebound tenderness or guarding.  EXTREMITIES: Without clubbing or edema.  SKIN: Pink, warm and dry without any rash or jaundice.  MUSCULOSKELETAL: Good equal strength and movement bilaterally.  NEUROLOGIC: Grossly intact.  PSYCHIATRIC: Alert, cooperative. Normal mood and affect.   LABORATORY STUDIES: Total protein 6, albumin 2.9, ALT 11, otherwise normal LFTs. Glucose 119, BUN 22, chloride 111, calcium 8.2, otherwise normal met-7. Magnesium 1.4.  Platelets 195, white blood cell count 7.7. INR 2.1 on 02/20/2014.   IMPRESSION: [name] is a very pleasant 80 year old Caucasian female on Eliquis with history of atrial fibrillation, who presents with large volume bright red rectal bleeding and  melena. She is status post 2 units packed red blood cells for hemoglobin 6.9, now 8.3 today. Since she will likely resume Eliquis, she will need colonoscopy and esophagogastroduodenoscopy to look for source of gastrointestinal bleeding. Differentials include diverticular bleeding, rapid upper gastrointestinal source such as peptic ulcer disease, small bowel arteriovenous malformations, colorectal carcinoma, polyp, or less likely ischemia. I discussed risks and benefits of the procedure which include, but are not limited to bleeding, infection, perforation, drug reaction, and she agrees with the plan, and consent will be obtained.   PLAN: 1.  Colonoscopy with EGD with Dr. Allen Norris tomorrow pending INR 1.5 or less. Standard prep.  Hold Eliquis. Agree with PPI drip. INR in a.m. and follow hemoglobin and hematocrit and transfuse to keep above 8 grams.   Thank you for allowing [place] to participate in the care of [name].    ____________________________ Andria Meuse, NP klj:dmm D: 02/21/2014 21:53:00 ET T: 02/21/2014 22:11:25 ET JOB#: [id number]  cc: Andria Meuse, NP, <Dictator> Nehemiah Settle, MD  [place], [place] Andria Meuse FNP ELECTRONICALLY SIGNED 03/01/2014 16:39

## 2015-03-04 NOTE — Consult Note (Signed)
General Aspect [name] is a 80yo Caucasian female w/ PMHx s/f permanent atrial fibrillation, on chronic Eliquis anticoagulation, obesity, DM2, HTN, HLD and h/o diverticulosis and colon polyps who was admitted to [facility] on 02/20/14 for hematochezia. Cardiology was consulted for atrial fibrillation and anticoagulation.  Her cardiologist is Dr. Irish Lack in [place]. Heart rate is controlled on Lopressor 148m BID. She was previously on Coumadin, but was switched to Eliquis for personal preference. "I did not like the idea of taking rat poison." She had been doing well on this regimen.   The day of admission, she reports experiencing copious and frequent BRBPR. She had been experiencing trace, intermittent bloody stools for several days prior. She denied abdominal pain, nausea, vomiting. The bleeding became profuse the date of admission lasting from 5:00 AM to 3:00 PM per the patient's description. [facility]  She denies chest pain, SOB/DOE, PND, orthopnea. She notes chronic LE edema. She denies palpitations, lightheadedness or syncope. She denies prior stress testing, cardiac caths or cardiac surgery. Denies prior CVA or neurological deficits.   Present Illness . There, H/H returned at 8.9/27. INR was elevated at 2.1. CMP was WNL. EKG confirmed atrial fibrillation w/ RBBB.   She was admitted by the hospitalist team. Eliquis was held. She was started on PPI IV. A subsequent Hgb returned decreased at 6.9. She became hypotensive. She was given FEIBA for Eliquis reversal and transfused 2 units of PRBCs.   GI was consulted. She underwent EGD today revealing gastritis w/o active bleeding. Colonoscopy demonstrated diverticulosis. Four 612mpolyps were removed in the ascending colon. No further bleeding. She is hemodynamically stable, alert and comfortable.   PAST MEDICAL HISTORY Type 2 DM Atrial fibrillation Hypertension Hyperlipidemia Obesity H/o  diverticulosis, polypectomies  PAST SURGICAL HISTORY Polypectomy  FAMILY HISTORY Father with MI. Aunt with "enlarged heart."   SOCIAL HISTORY Denies tobacco, EtOH or illicit drug use.   Physical Exam:  GEN well developed, no acute distress, obese   HEENT pale conjunctivae, PERRL, hearing intact to voice   NECK supple  No masses  trachea midline  JVP 8-9 cm, no bruits   RESP normal resp effort  clear BS  no use of accessory muscles   CARD Irregular rate and rhythm  Normal, S1, S2  No murmur   ABD denies tenderness  soft  hypoactive BS   EXTR negative cyanosis/clubbing, negative edema   SKIN normal to palpation   NEURO follows commands, motor/sensory function intact   PSYCH alert, A+O to time, place, person   Review of Systems:  Subjective/Chief Complaint GI bleed   General: Weakness    Skin: No Complaints    ENT: No Complaints    Eyes: No Complaints    Neck: No Complaints    Respiratory: No Complaints    Cardiovascular: No Complaints    Gastrointestinal: Rectal Bleeding   Genitourinary: No Complaints    Vascular: No Complaints    Musculoskeletal: No Complaints    Neurologic: No Complaints    Hematologic: No Complaints    Endocrine: No Complaints    Psychiatric: No Complaints    Review of Systems: All other systems were reviewed and found to be negative   Medications/Allergies Reviewed Medications/Allergies reviewed    Home Medications: Medication Instructions Status  furosemide 40 mg oral tablet  Active  Eliquis 5 mg oral tablet 1 tab(s) orally 2 times a day Active  metoprolol 100 mg oral tablet 1 tab(s) orally 2 times a day  Active  potassium chloride 10 mEq oral capsule, extended release 3 tab(s) orally once a day Active  fluticasone nasal 50 mcg/inh nasal spray 2 spray(s) nasal 2 times a day, As Needed Active  ferrous sulfate 325 mg oral tablet 1 tab(s) orally once a day Active  simvastatin 40 mg oral tablet 1 tab(s) orally once a day  Active   Lab Results:  Routine Chem:  13-Apr-15 00:11   Glucose, Serum  119  BUN  22  Creatinine (comp) 1.01  Sodium, Serum 143  Potassium, Serum 4.3  Chloride, Serum  111  CO2, Serum 25  Calcium (Total), Serum  8.2  Anion Gap 7  Osmolality (calc) 289  eGFR (African American) >60  eGFR (Non-African American)  54 (eGFR values <64m/min/1.73 m2 may be an indication of chronic kidney disease (CKD). Calculated eGFR is useful in patients with stable renal function. The eGFR calculation will not be reliable in acutely ill patients when serum creatinine is changing rapidly. It is not useful in  patients on dialysis. The eGFR calculation may not be applicable to patients at the low and high extremes of body sizes, pregnant women, and vegetarians.)  Magnesium, Serum 1.9 (1.8-2.4 THERAPEUTIC RANGE: 4-7 mg/dL TOXIC: > 10 mg/dL  -----------------------)  Routine UA:  12-Apr-15 19:39   Color (UA) Yellow  Clarity (UA) Hazy  Glucose (UA) Negative  Bilirubin (UA) Negative  Ketones (UA) Trace  Specific Gravity (UA) 1.018  Blood (UA) 3+  pH (UA) 5.0  Protein (UA) Negative  Nitrite (UA) Negative  Leukocyte Esterase (UA) 2+ (Result(s) reported on 20 Feb 2014 at 08:03PM.)  RBC (UA) 71 /HPF  WBC (UA) 34 /HPF  Bacteria (UA) 1+  Epithelial Cells (UA) 3 /HPF (Result(s) reported on 20 Feb 2014 at 08:03PM.)  Routine Coag:  12-Apr-15 16:39   INR 2.1 (INR reference interval applies to patients on anticoagulant therapy. A single INR therapeutic range for coumarins is not optimal for all indications; however, the suggested range for most indications is 2.0 - 3.0. Exceptions to the INR Reference Range may include: Prosthetic heart valves, acute myocardial infarction, prevention of myocardial infarction, and combinations of aspirin and anticoagulant. The need for a higher or lower target INR must be assessed individually. Reference: The Pharmacology and Management of the Vitamin K   antagonists: the seventh ACCP Conference on Antithrombotic and Thrombolytic Therapy. CRAQTM.2263Sept:126 (3suppl): 2N9146842 A HCT value >55% may artifactually increase the PT.  In one study,  the increase was an average of 25%. Reference:  "Effect on Routine and Special Coagulation Testing Values of Citrate Anticoagulant Adjustment in Patients with High HCT Values." American Journal of Clinical Pathology 2006;126:400-405.)  14-Apr-15 04:15   INR 1.3 (INR reference interval applies to patients on anticoagulant therapy. A single INR therapeutic range for coumarins is not optimal for all indications; however, the suggested range for most indications is 2.0 - 3.0. Exceptions to the INR Reference Range may include: Prosthetic heart valves, acute myocardial infarction, prevention of myocardial infarction, and combinations of aspirin and anticoagulant. The need for a higher or lower target INR must be assessed individually. Reference: The Pharmacology and Management of the Vitamin K  antagonists: the seventh ACCP Conference on Antithrombotic and Thrombolytic Therapy. CFHLKT.6256Sept:126 (3suppl): 2N9146842 A HCT value >55% may artifactually increase the PT.  In one study,  the increase was an average of 25%. Reference:  "Effect on Routine and Special Coagulation Testing Values of Citrate Anticoagulant Adjustment in Patients with High HCT Values." American Journal of  Clinical Pathology 0973;532:992-426.)  Routine Hem:  12-Apr-15 16:39   Hemoglobin (CBC)  8.9  Hematocrit (CBC)  27.0  13-Apr-15 00:11   Hemoglobin (CBC)  6.9  WBC (CBC) 8.6  RBC (CBC)  2.04  Hematocrit (CBC)  21.4  Platelet Count (CBC) 232  MCV  105  MCH 33.9  MCHC 32.3  RDW  16.6  Neutrophil % 73.9  Lymphocyte % 17.0  Monocyte % 6.7  Eosinophil % 1.6  Basophil % 0.8  Neutrophil # 6.4  Lymphocyte # 1.5  Monocyte # 0.6  Eosinophil # 0.1  Basophil # 0.1 (Result(s) reported on 21 Feb 2014 at 12:30AM.)    07:22    Hemoglobin (CBC)  8.3  Hematocrit (CBC)  25.3    13:00   Hemoglobin (CBC)  8.2  Hematocrit (CBC)  24.1    18:22   Hemoglobin (CBC)  10.5 (Result(s) reported on 21 Feb 2014 at 06:46PM.)    22:31   Hemoglobin (CBC)  8.5 (Result(s) reported on 21 Feb 2014 at 10:51PM.)  14-Apr-15 04:15   Hemoglobin (CBC)  7.3  Hematocrit (CBC)  23.1    16:02   Hemoglobin (CBC)  10.0 (Result(s) reported on 22 Feb 2014 at 04:21PM.)   EKG:  Interpretation EKG shows atrial fibrillation, RBBB, no ST/T changes   Rate 96    Allergy Status Unknown:   Vital Signs/Nurse's Notes: **Vital Signs.:   14-Apr-15 16:01  Vital Signs Type Routine  Temperature Source oral  Pulse Pulse 78  Pulse source if not from Vital Sign Device per cardiac monitor  Systolic BP Systolic BP 834  Diastolic BP (mmHg) Diastolic BP (mmHg) 54  Mean BP 78  Pulse Ox % Pulse Ox % 98  Pulse Ox Activity Level  At rest  Oxygen Delivery Room Air/ 21 %  Pulse Ox Heart Rate 94  Telemetry pattern Cardiac Rhythm Atrial fibrillation; hr 115-130    Impression 80yo Caucasian female w/ PMHx s/f permanent atrial fibrillation, on chronic Xarelto anticoagulation, obesity, DM2, HTN, HLD and h/o diverticulosis and colon polyps who was admitted to [facility] on 02/20/14 for hematochezia.  1. Hematochezia  colonoscopy revealed diverticulosis. Polyps removed in ascending colon (no mention of bleeding). Gastritis noted on EGD. No further active bleeding. Receiving intermittent transfusions as needed.  -- Management per primary team/GI  2. Acute blood loss anemia-  chronic anemia noted on prior history. H/H stable s/p multiple transfusions. -- Continue to monitor -- Hold anticoagulation (see below)  3. Atrial fibrillation-  rate is well-controlled on metoprolol tartrate. Eliquis on hold and reversed in the setting of acute GI bleed. CHA2DSVASc score is 5 (HTN, age > 61, DM2, female). -- Continue holding anticoagulation until GI issues resolved. Risk  outweighs benefit at this time. Re-address on follow-up.Could try eliquis 2.5 mg po BID. Patient has indicated her wishes to restart anticoagulation at a later date -- Patient would like to follow-up in [place], [place], [place]. Will transfer records. Arrange follow-up near discharge. -- Continue Lopressor for rate-control -- Update echo- this may be done in- or outpatient. She has evidence of JVD on exam. Lungs are clear. Prior pulmonology notes on Epic indicate she has secondary pulmonary HTN from obesity. This may account for elevated JVP. No primary pulmonary process. Anemia may contribute.   4. Hypertension- BP stable/well-controlled -- Continue antihypertensives  5. Hyperlipidemia -- Continue statin  6. Type 2 DM -- Glycemic control per primary team  7. UTI- per u/a -- On empiric antibiotics   Electronic Signatures: Keari Miu,  Jaquelyn Sakamoto A (PA-C)  (Signed 14-Apr-15 17:03)  Authored: General Aspect/Present Illness, History and Physical Exam, Review of System, Home Medications, Labs, EKG , Allergies, Vital Signs/Nurse's Notes, Impression/Plan Ida Rogue (MD)  (Signed 14-Apr-15 18:15)  Authored: General Aspect/Present Illness, History and Physical Exam, Review of System, Labs, EKG , Impression/Plan  Co-Signer: General Aspect/Present Illness, History and Physical Exam, Review of System, Home Medications, Labs, EKG , Allergies, Vital Signs/Nurse's Notes, Impression/Plan   Last Updated: 14-Apr-15 18:15 by Ida Rogue (MD)

## 2015-03-04 NOTE — Consult Note (Signed)
Brief Consult Note: Diagnosis: hematochezia, melena, anemia.   Patient was seen by consultant.   Comments: Ms. [name] is a very pleasant 80 y/o caucasian female on eliquis with hx atrial fibrillation who presents with large volume bright red rectal bleeding & melena.  She is s/p 2 units PRBCs for Hgb 6.9, now 8.3.  Since she will resume eliquis, she will need colonoscopy & EGD to look for source of GI bleeding.  Differentials include diverticular bleeding, rapid UGI source such as PUD, AVMS, colorectal polyp or carcinoma or less likely ischemia.  Discussed risks/benefits of procedure which include but are not limited to bleeding, infection, perforation & drug reaction.  Patient agrees with this plan & consent will be obtained.  Plan: 1) Colonoscopy/EGD with Dr Servando SnareWohl tomorrow pending INR 1.5 or less. Standard prep 2) Hold eliquis 3) Agree with PPI drip 4) INR AM 5) Follow H/H & transfuse to keep above 8 grams Thanks for allowing us to participate in her care.  Please see full dictated note. #[id number].  Electronic Signatures: Joselyn ArrowJones, Kei Mcelhiney L (NP)  (Signed 13-Apr-15 21:54)  Authored: Brief Consult Note   Last Updated: 13-Apr-15 21:54 by Joselyn ArrowJones, Suriyah Vergara L (NP)

## 2015-03-04 NOTE — Consult Note (Signed)
Chief Complaint:  Subjective/Chief Complaint Pt denies any hematochezia or melena.  Denies abdominal pain, nausea or vomiting.  Tolerating diet well.   VITAL SIGNS/ANCILLARY NOTES: **Vital Signs.:   15-Apr-15 05:54  Vital Signs Type Routine  Temperature Temperature (F) 98.2  Celsius 36.7  Temperature Source oral  Pulse Pulse 68  Respirations Respirations 19  Systolic BP Systolic BP 96  Diastolic BP (mmHg) Diastolic BP (mmHg) 53  Mean BP 67  Pulse Ox % Pulse Ox % 96  Pulse Ox Activity Level  At rest  Oxygen Delivery Room Air/ 21 %    06:42  Telemetry pattern Cardiac Rhythm Atrial fibrillation; pattern reported by Telemetry Clerk; hr 74   Brief Assessment:  GEN well developed, no acute distress, obese, A/Ox3.   Cardiac Irregular   Respiratory normal resp effort   Gastrointestinal details normal Soft  Nontender  Nondistended  Bowel sounds normal  No rebound tenderness  No gaurding   EXTR negative cyanosis/clubbing, Trace ankle edema   Additional Physical Exam Skin: pink, warm, dry HEENT: sclera clear, no icterus   Lab Results: Routine Hem:  15-Apr-15 04:06   WBC (CBC) 7.4  RBC (CBC)  2.68  Hemoglobin (CBC)  8.7  Hematocrit (CBC)  26.1  Platelet Count (CBC) 199  MCV 98  MCH 32.5  MCHC 33.3  RDW  21.5  Neutrophil % 61.6  Lymphocyte % 22.4  Monocyte % 9.6  Eosinophil % 5.3  Basophil % 1.1  Neutrophil # 4.5  Lymphocyte # 1.7  Monocyte # 0.7  Eosinophil # 0.4  Basophil # 0.1 (Result(s) reported on 23 Feb 2014 at 04:57AM.)   Assessment/Plan:  Assessment/Plan:  Assessment GI bleed likely secondary to diverticular bleeding:  Although no gross blood seen on endoscopic exam, she has extensive pancolonic diverticula.  No further gross bleeding.  Eliquis on hold per cardiology.   Anemia: Hgb 8.7, s/p 3 units PRBCs total Gastritis: ON PPI   Plan 1) Pt to call if any further bleeding.  She should have NM GI bleeding scan. 2) recheck Hgb in 2 weeks or sooner if  bleeding 3) Follow up in 4 weeks with our office 4) We will call with pathology results 5) follow up with Dr Mariah MillingGollan regarding anticoagulation Please call if you have any questions or concerns   Electronic Signatures: Joselyn ArrowJones, Arlayne Liggins L (NP)  (Signed 15-Apr-15 13:18)  Authored: Chief Complaint, VITAL SIGNS/ANCILLARY NOTES, Brief Assessment, Lab Results, Assessment/Plan   Last Updated: 15-Apr-15 13:18 by Joselyn ArrowJones, Kenlei Safi L (NP)

## 2015-03-04 NOTE — H&P (Signed)
PATIENT NAME:  [name], [name] MR#:  [id number] DATE OF BIRTH:  October 16, 1935  DATE OF ADMISSION:  02/20/2014  PRIMARY CARE PHYSICIAN:  Theressa MillardJames Osborne.  REFERRING PHYSICIAN:  Dr. Mayford KnifeWilliams.    CHIEF COMPLAINT: Rectal bleeding.   HISTORY OF PRESENT ILLNESS: A [age] year old Caucasian female with a history of hypertension, diabetes, A. fib on Eliquis, diverticulosis presented to the ED with rectal bleeding today. The patient is alert, awake, alert, oriented, in no acute distress. The patient said that she had some rectal bleeding 3 weeks ago but today she noticed rectal bleeding since 5:00 a.m. which is continuously until 3:00 p.m. She has a large amount of fresh blood. She denies any chest pain. No headache or dizziness. No weakness. Denies any other symptoms.   PAST MEDICAL HISTORY: Hypertension, diabetes, A. fib on Eliquis for 1 year,  diverticulosis, hyperlipidemia, anemia since childhood.   PAST SURGICAL HISTORY:  None.  FAMILY HISTORY: Father had a heart attack.   REVIEW OF SYSTEMS:    CONSTITUTIONAL: The patient denies any fever or chills. No headache or dizziness. No weakness.  EYES: No double vision or blurred vision. ENT: No postnasal drip, slurred speech or dysphagia.  CARDIOVASCULAR: No chest pain, palpitation, orthopnea, nocturnal dyspnea. No leg edema.  PULMONARY: No cough, sputum, shortness of breath or hemoptysis.  GASTROINTESTINAL: No abdominal pain, nausea, vomiting, diarrhea. No melena or bloody stool.  GENITOURINARY: No dysuria, hematuria or incontinence.  SKIN: No rash or jaundice.  NEUROLOGIC: No syncope, loss of consciousness or seizure.  HEMATOLOGIC: No easy bruising or bleeding.  ENDOCRINE: No polyuria, polydipsia, heat or cold intolerance.   SOCIAL HISTORY: No smoking or drinking or illicit drugs.   MEDICATION: Zocor 40 mg p.o. daily, Lopressor 100 mg p.o. b.i.d., Eliquis 5 mg p.o. daily.   PHYSICAL EXAMINATION: VITAL SIGNS: Temperature 97.7, blood pressure 97/63 and  now 118/66, pulse 100, respirations 18, O2 saturation 99% on room air.  GENERAL: The patient is alert, awake, oriented, in no acute distress.   EYES:  Pupils round, equal and reactive to light and accommodation. Moist oral mucosa. Clear oropharynx.  NECK: Supple. No JVD or carotid bruits. No lymphadenopathy, no thyromegaly.  CARDIOVASCULAR: S1, S2 is regular rate and rhythm. No murmurs or gallops.  PULMONARY: Bilateral air entry. No wheezing or rales. No use of accessory muscles to breathe.  ABDOMEN: Soft. No distention or tenderness. No organomegaly. Bowel sounds present.  EXTREMITIES: No edema, clubbing or cyanosis. No calf tenderness. Bilateral pedal pulses present.  SKIN: No rash or jaundice.  NEUROLOGY: A and O x 3. No focal deficit. Power 5/5. Sensation intact.   LABORATORY, DIAGNOSTIC AND RADIOLOGICAL DATA:  WBC 7.4, hemoglobin 8.9, platelets 254. Glucose 112, BUN 20, creatinine 0.95, electrolytes normal. INR 2.1. EKG showed A. fib at 97 bpm with right bundle branch block.   IMPRESSIONS: 1.  Gastrointestinal bleeding, possibly due to diverticulosis.  2.  Anemia.  3.  Atrial fibrillation.  4.  Hypertension.  5.  Diabetes.  6.  Hyperlipidemia.   PLAN OF TREATMENT: 1.  The patient will be admitted to medical floor with telemonitor. We will keep n.p.o. and start IV fluid support and start Pepcid IV b.i.d.  Follow up hemoglobin every 8 hours. Get a GI consult.  2.  We will hold Eliquis and continue on Lopressor for A. fib.  3.  For diabetes, we will start sliding scale.  4.  I discussed the patient'[name] condition and plan of treatment with the patient and the patient'[name] sister-in-law.  Patient wants full code.   TIME SPENT: About 56 minutes.    ____________________________ Shaune Pollack, MD qc:cs D: 02/20/2014 18:22:00 ET T: 02/20/2014 19:22:18 ET JOB#: [id number]  cc: Shaune Pollack, MD, <Dictator> Shaune Pollack MD ELECTRONICALLY SIGNED 02/21/2014 10:53

## 2015-03-04 NOTE — Discharge Summary (Signed)
PATIENT NAME:  [name], [name] MR#:  [id number] DATE OF BIRTH:  1935/06/12  DATE OF ADMISSION:  02/20/2014 DATE OF DISCHARGE:  02/23/2014  For a detailed note, please see the history and physical done on admission by Dr. Imogene Burnhen.   DIAGNOSES AT DISCHARGE:  Gastrointestinal bleed, anemia secondary to the acute blood loss anemia from the gastrointestinal bleed, urinary tract infection. Hypertension. Hyperlipidemia. History of chronic atrial fibrillation.   DISCHARGE DIET:  The patient is being discharged on a low-sodium, low-fat diet.   ACTIVITY: As tolerated.   FOLLOWUP:  Follow up with Dr. Julien Nordmannimothy Gollan in the next 1-2 weeks. Follow up with Dr. Midge Miniumarren Wohl from gastroenterology in the next 1-2 weeks.   DISCHARGE MEDICATIONS: Lasix 40 mg daily, metoprolol tartrate 100 mg b.i.d., potassium 10 mEq 3 tabs daily, Flonase 2 sprays to each nostril b.i.d., iron sulfate 325 mg daily, simvastatin 40 mg daily, Ceftin 250 mg b.i.d. x 3 days, and omeprazole 20 mg daily.   CONSULTANTS DURING THE HOSPITAL COURSE: Dr. Julien Nordmannimothy Gollan from cardiology. Dr. Midge Miniumarren Wohl from gastroenterology.   PERTINENT STUDIES DONE DURING THE HOSPITAL COURSE:  Are as follows: An upper GI endoscopy done on April 14th showing gastritis but no evidence of acute bleeding. A colonoscopy showing diverticulosis throughout the entire colon. Some polyps removed but no evidence of acute bleeding.   HOSPITAL COURSE: This is a 80 year old female who presented to the hospital on 02/20/2014 due to rectal bleeding and noted to be anemic.  1. GI bleed. This was likely the cause of patient'[name] anemia and rectal bleeding. This was suspected to be a lower GI bleed. The patient was on Eliquis prior to coming in, which was discontinued. The patient did receive some recombinant factor X to reverse the effect of the Eliquis. The patient also received a total of 3 units of packed red blood cells while in the hospital. Posttransfusion, the patient'[name] hemoglobin  has remained fairly stable. She has shown no evidence of any further rectal bleeding. She did have an upper GI endoscopy and colonoscopy which showed just gastritis but no evidence of acute bleeding. The patient did have diverticulosis, which was suspected to be the cause of her GI bleed. Post endoscopy and colonoscopy, the patient'[name] diet was advanced from a clear liquid to a regular diet, which she is currently tolerating well with no evidence of acute bleeding. She currently is being discharged home on a PPI daily and advised to discontinue her Eliquis for now until she has further discussion with her cardiologist.  2. Anemia. This is likely acute blood loss anemia from the GI bleed. The patient did receive a total of 3 units of packed red blood cells while in the hospital.  Her hemoglobin has been stable.  It is 9.3 at discharge.  3. Chronic atrial fibrillation. The patient remained rate controlled in the hospital on her metoprolol, which she will continue. She was taken off of the Eliquis given the GI bleed. Her CHADS score was at least 5, so the patient is a high risk for stroke and therefore likely needs to be on long-term anticoagulation. The patient is to discuss this further with her cardiologist, Dr. Mariah MillingGollan, as an outpatient when to resume her anticoagulation.  4. Urinary tract infection. The patient was noted to have abnormal urinalysis and started on empiric ceftriaxone. Currently is being discharged on oral Ceftin for the next 3 days.  5. Hyperlipidemia. The patient was maintained on her simvastatin. She will resume that upon discharge.  CODE STATUS: The patient is a full code.   TIME SPENT: 40 minutes.   ____________________________ Rolly Pancake. Cherlynn Kaiser, MD vjs:dd D: 02/23/2014 15:44:04 ET T: 02/23/2014 20:17:22 ET JOB#: [id number]  cc: Rolly Pancake. Cherlynn Kaiser, MD, <Dictator> Julie Iba, MD Midge Minium, MD  Houston Siren MD ELECTRONICALLY SIGNED 03/07/2014 10:41

## 2015-03-12 ENCOUNTER — Encounter: Payer: Self-pay | Admitting: *Deleted

## 2015-03-12 ENCOUNTER — Inpatient Hospital Stay
Admission: EM | Admit: 2015-03-12 | Discharge: 2015-03-20 | DRG: 907 | Disposition: A | Discharge status: Skilled Nursing Facility | Payer: Medicare Other | Attending: Internal Medicine | Admitting: Internal Medicine

## 2015-03-12 ENCOUNTER — Emergency Department: Payer: Medicare Other

## 2015-03-12 DIAGNOSIS — I5033 Acute on chronic diastolic (congestive) heart failure: Secondary | ICD-10-CM | POA: Diagnosis present

## 2015-03-12 DIAGNOSIS — Z8249 Family history of ischemic heart disease and other diseases of the circulatory system: Secondary | ICD-10-CM

## 2015-03-12 DIAGNOSIS — K5790 Diverticulosis of intestine, part unspecified, without perforation or abscess without bleeding: Secondary | ICD-10-CM | POA: Diagnosis present

## 2015-03-12 DIAGNOSIS — I10 Essential (primary) hypertension: Secondary | ICD-10-CM | POA: Diagnosis present

## 2015-03-12 DIAGNOSIS — Z86010 Personal history of colonic polyps: Secondary | ICD-10-CM | POA: Diagnosis not present

## 2015-03-12 DIAGNOSIS — Z7951 Long term (current) use of inhaled steroids: Secondary | ICD-10-CM

## 2015-03-12 DIAGNOSIS — J189 Pneumonia, unspecified organism: Secondary | ICD-10-CM | POA: Diagnosis not present

## 2015-03-12 DIAGNOSIS — I482 Chronic atrial fibrillation: Secondary | ICD-10-CM | POA: Diagnosis present

## 2015-03-12 DIAGNOSIS — Z825 Family history of asthma and other chronic lower respiratory diseases: Secondary | ICD-10-CM

## 2015-03-12 DIAGNOSIS — Z452 Encounter for adjustment and management of vascular access device: Secondary | ICD-10-CM

## 2015-03-12 DIAGNOSIS — I4891 Unspecified atrial fibrillation: Secondary | ICD-10-CM | POA: Diagnosis present

## 2015-03-12 DIAGNOSIS — Z888 Allergy status to other drugs, medicaments and biological substances status: Secondary | ICD-10-CM

## 2015-03-12 DIAGNOSIS — Z8701 Personal history of pneumonia (recurrent): Secondary | ICD-10-CM

## 2015-03-12 DIAGNOSIS — Z7901 Long term (current) use of anticoagulants: Secondary | ICD-10-CM

## 2015-03-12 DIAGNOSIS — Z9884 Bariatric surgery status: Secondary | ICD-10-CM | POA: Diagnosis not present

## 2015-03-12 DIAGNOSIS — K625 Hemorrhage of anus and rectum: Secondary | ICD-10-CM

## 2015-03-12 DIAGNOSIS — R0602 Shortness of breath: Secondary | ICD-10-CM

## 2015-03-12 DIAGNOSIS — K5731 Diverticulosis of large intestine without perforation or abscess with bleeding: Secondary | ICD-10-CM | POA: Diagnosis present

## 2015-03-12 DIAGNOSIS — Y95 Nosocomial condition: Secondary | ICD-10-CM | POA: Diagnosis present

## 2015-03-12 DIAGNOSIS — D62 Acute posthemorrhagic anemia: Secondary | ICD-10-CM | POA: Diagnosis present

## 2015-03-12 DIAGNOSIS — E669 Obesity, unspecified: Secondary | ICD-10-CM | POA: Diagnosis present

## 2015-03-12 DIAGNOSIS — I5032 Chronic diastolic (congestive) heart failure: Secondary | ICD-10-CM | POA: Diagnosis present

## 2015-03-12 DIAGNOSIS — K922 Gastrointestinal hemorrhage, unspecified: Secondary | ICD-10-CM

## 2015-03-12 DIAGNOSIS — R58 Hemorrhage, not elsewhere classified: Secondary | ICD-10-CM

## 2015-03-12 DIAGNOSIS — I959 Hypotension, unspecified: Secondary | ICD-10-CM | POA: Diagnosis present

## 2015-03-12 DIAGNOSIS — E119 Type 2 diabetes mellitus without complications: Secondary | ICD-10-CM | POA: Diagnosis present

## 2015-03-12 DIAGNOSIS — R000 Tachycardia, unspecified: Secondary | ICD-10-CM | POA: Diagnosis present

## 2015-03-12 DIAGNOSIS — E785 Hyperlipidemia, unspecified: Secondary | ICD-10-CM | POA: Diagnosis present

## 2015-03-12 DIAGNOSIS — K91840 Postprocedural hemorrhage and hematoma of a digestive system organ or structure following a digestive system procedure: Secondary | ICD-10-CM | POA: Diagnosis present

## 2015-03-12 DIAGNOSIS — M722 Plantar fascial fibromatosis: Secondary | ICD-10-CM | POA: Diagnosis present

## 2015-03-12 DIAGNOSIS — Z79899 Other long term (current) drug therapy: Secondary | ICD-10-CM

## 2015-03-12 LAB — CBC WITH DIFFERENTIAL/PLATELET
BASOS ABS: 0.1 10*3/uL (ref 0–0.1)
Basophils Relative: 2 %
EOS ABS: 0.2 10*3/uL (ref 0–0.7)
Eosinophils Relative: 3 %
HCT: 26.1 % — ABNORMAL LOW (ref 35.0–47.0)
HEMOGLOBIN: 8.5 g/dL — AB (ref 12.0–16.0)
Lymphocytes Relative: 18 %
Lymphs Abs: 0.9 10*3/uL — ABNORMAL LOW (ref 1.0–3.6)
MCH: 31.9 pg (ref 26.0–34.0)
MCHC: 32.6 g/dL (ref 32.0–36.0)
MCV: 97.7 fL (ref 80.0–100.0)
Monocytes Absolute: 0.5 10*3/uL (ref 0.2–0.9)
Neutro Abs: 3.4 10*3/uL (ref 1.4–6.5)
PLATELETS: 248 10*3/uL (ref 150–440)
RBC: 2.68 MIL/uL — ABNORMAL LOW (ref 3.80–5.20)
RDW: 17.6 % — ABNORMAL HIGH (ref 11.5–14.5)
WBC: 5.1 10*3/uL (ref 3.6–11.0)

## 2015-03-12 LAB — COMPREHENSIVE METABOLIC PANEL
ALT: 7 U/L — AB (ref 14–54)
ANION GAP: 7 (ref 5–15)
AST: 19 U/L (ref 15–41)
Albumin: 2.9 g/dL — ABNORMAL LOW (ref 3.5–5.0)
Alkaline Phosphatase: 78 U/L (ref 38–126)
BILIRUBIN TOTAL: 1 mg/dL (ref 0.3–1.2)
BUN: 13 mg/dL (ref 6–20)
CHLORIDE: 105 mmol/L (ref 101–111)
CO2: 28 mmol/L (ref 22–32)
Calcium: 8 mg/dL — ABNORMAL LOW (ref 8.9–10.3)
Creatinine, Ser: 0.98 mg/dL (ref 0.44–1.00)
GFR calc Af Amer: >60 mL/min (ref >60)
GFR, EST NON AFRICAN AMERICAN: 54 mL/min — AB (ref >60)
GLUCOSE: 168 mg/dL — AB (ref 65–99)
POTASSIUM: 2.9 mmol/L — AB (ref 3.5–5.1)
Sodium: 140 mmol/L (ref 135–145)
TOTAL PROTEIN: 5.6 g/dL — AB (ref 6.5–8.1)

## 2015-03-12 LAB — TROPONIN I

## 2015-03-12 MED ORDER — SODIUM CHLORIDE 0.9 % IV SOLN
1000.0000 mL | Freq: Once | INTRAVENOUS | Status: AC
  Administered 2015-03-12: 1000 mL via INTRAVENOUS

## 2015-03-12 NOTE — ED Provider Notes (Signed)
[facility] Emergency Department Provider Note    ____________________________________________  Time seen: ----------------------------------------- 9:54 PM on 03/12/2015 -----------------------------------------    I have reviewed the triage vital signs and the nursing notes.   HISTORY  Chief Complaint Rectal Bleeding       HPI [name] is a 80 y.o. female with history of atrial fibrillation though not chronically anticoagulated at this time, history of prior GI bleed, hypertension, hyperlipidemia, diabetes who presents for evaluation of bright red blood per rectum that began just prior to arrival. she denies abdominal pain, no nausea vomiting or hematemesis. No chest pain. Has felt slightly lightheaded. Bleeding began suddenly. Moderate severity. Painless rectal bleeding. No new medications, she reports that she is prescribed Eliquis but has not taken it an over a year. No modifying factors. No other anticoagulants.     Past Medical History  Diagnosis Date  . Personal history of colonic polyps   . Chronic a-fib     a. CHA2DS2VASc = 5 (HTN, age > 68, DM2, female);  b. Eliquis d/c'd 4/015 in setting of BRBPR/GIB;  c. Rate-control w/ BB.  . Type II or unspecified type diabetes mellitus without mention of complication, not stated as uncontrolled   . Obesity, unspecified   . Hyperlipidemia   . HTN (hypertension)   . Cellulitis and abscess of leg, except foot   . History of pneumonia   . Plantar fascial fibromatosis   . Diverticulosis   . RBBB   . GIB (gastrointestinal bleeding)     a. 02/2014 BRBPR;  b. 02/2014 Colonoscopy/EGD: diverticulosis with polys/polypectomy - ascending colon, Gastritis on EDG.  . Gastritis     a. 02/2014 EGD.  Marland Kitchen Anemia     a. 02/2014 in setting of GIB req 3U PRBC's.  . Atrial fibrillation 03/12/2015    Patient Active Problem List   Diagnosis Date Noted  . Gait instability 10/26/2014  . Chronic diastolic CHF (congestive  heart failure) 03/18/2014  . GIB (gastrointestinal bleeding) 03/01/2014  . Diverticulosis 03/01/2014  . Systolic murmur 03/01/2014  . PULMONARY HYPERTENSION, SECONDARY 01/16/2011  . ALLERGIC RHINITIS 01/16/2011  . DM 01/15/2011  . Hyperlipidemia 01/15/2011  . Obesity 01/15/2011  . Essential hypertension 01/15/2011  . ATRIAL FIBRILLATION 01/15/2011  . VIRAL PNEUMONIA 01/15/2011  . CELLULITIS, LEG, RIGHT 01/15/2011  . PLANTAR FASCIITIS 01/15/2011  . COLONIC POLYPS, HX OF 01/15/2011    History reviewed. No pertinent past surgical history.  Current Outpatient Rx  Name  Route  Sig  Dispense  Refill  . albuterol (PROVENTIL HFA) 108 (90 BASE) MCG/ACT inhaler   Inhalation   Inhale 2 puffs into the lungs every 6 (six) hours as needed.           . doxazosin (CARDURA) 4 MG tablet   Oral   Take 1 tablet (4 mg total) by mouth daily.   90 tablet   3   . ELIQUIS 2.5 MG TABS tablet      TAKE 1 TABLET TWICE A DAY   180 tablet   3   . ferrous sulfate 325 (65 FE) MG tablet   Oral   Take 325 mg by mouth daily with breakfast.         . fluticasone (FLONASE) 50 MCG/ACT nasal spray   Nasal   Place 2 sprays into the nose 2 (two) times daily.          . furosemide (LASIX) 40 MG tablet   Oral   Take 40 mg by  mouth daily as needed.          . metoprolol (LOPRESSOR) 100 MG tablet   Oral   Take 1 tablet (100 mg total) by mouth 2 (two) times daily. 1 by mouth twice daily   180 tablet   3   . omeprazole (PRILOSEC) 20 MG capsule   Oral   Take 20 mg by mouth daily.          . potassium chloride (KLOR-CON) 10 MEQ CR tablet   Oral   Take 20 mEq by mouth daily.          . simvastatin (ZOCOR) 20 MG tablet   Oral   Take 20 mg by mouth daily.           Allergies Diltiazem hcl  Family History  Problem Relation Age of Onset  . Asthma Father   . Emphysema Father   . Heart attack Father   . Hypertension Mother     Social History History  Substance Use Topics  .  Smoking status: Never Smoker   . Smokeless tobacco: Never Used  . Alcohol Use: No     Comment: occasional    Review of Systems  Constitutional: Negative for fever. Eyes: Negative for visual changes. ENT: Negative for sore throat. Cardiovascular: Negative for chest pain. Respiratory: Negative for shortness of breath. Gastrointestinal: Negative for abdominal pain, vomiting and diarrhea. Genitourinary: Negative for dysuria. Musculoskeletal: Negative for back pain. Skin: Negative for rash. Neurological: Negative for headaches, focal weakness or numbness.   10-point ROS otherwise negative.  ____________________________________________   PHYSICAL EXAM:  VITAL SIGNS: ED Triage Vitals  Enc Vitals Group     BP 03/12/15 2131 93/40 mmHg     Pulse Rate 03/12/15 2131 71     Resp --      Temp 03/12/15 2131 98.6 F (37 C)     Temp Source 03/12/15 2131 Oral     SpO2 03/12/15 2131 100 %     Weight 03/12/15 2131 211 lb (95.709 kg)     Height 03/12/15 2131 5\' 4"  (1.626 m)     Head Cir --      Peak Flow --      Pain Score 03/12/15 2132 0     Pain Loc --      Pain Edu? --      Excl. in GC? --      Constitutional: Alert and oriented. Well appearing and in no distress. Eyes: Conjunctivae are normal. PERRL. Normal extraocular movements. ENT   Head: Normocephalic and atraumatic.   Nose: No congestion/rhinnorhea.   Mouth/Throat: Mucous membranes are moist.   Neck: No stridor. Hematological/Lymphatic/Immunilogical: No cervical lymphadenopathy. Cardiovascular: Normal rate, regular rhythm. Normal and symmetric distal pulses are present in all extremities. No murmurs, rubs, or gallops. Respiratory: Normal respiratory effort without tachypnea nor retractions. Breath sounds are clear and equal bilaterally. No wheezes/rales/rhonchi. Gastrointestinal: Soft and nontender. No distention. No abdominal bruits. There is no CVA tenderness. Rectal: dark blood in the rectal  vault Genitourinary: deferred Musculoskeletal: Nontender with normal range of motion in all extremities. No joint effusions.  No lower extremity tenderness nor edema. Neurologic:  Normal speech and language. No gross focal neurologic deficits are appreciated. Speech is normal. No gait instability. Skin:  Skin is warm, dry and intact. No rash noted. Psychiatric: Mood and affect are normal. Speech and behavior are normal. Patient exhibits appropriate insight and judgment.  ____________________________________________   EKG  ED ECG REPORT   Date: 03/12/2015  EKG Time: 22:12  Rate: 67  Rhythm: normal EKG, normal sinus rhythm, unchanged from previous tracings, atrial fibrillation, rate 67  Axis: Normal  Intervals:right bundle branch block  ST&T Change: Nonspecific T wave abnormality   ____________________________________________    RADIOLOGY  CXR Pending  ____________________________________________   PROCEDURES  Procedure(s) performed: None  Critical Care performed: No  ____________________________________________   INITIAL IMPRESSION / ASSESSMENT AND PLAN / ED COURSE  Pertinent labs & imaging results that were available during my care of the patient were reviewed by me and considered in my medical decision making (see chart for details).   [name] is a 80 y.o. female with history of atrial fibrillation though not chronically anticoagulated at this time, history of prior GI bleed, hypertension, hyperlipidemia, diabetes who presents for evaluation of bright red blood per rectum that began just prior to arrival. She has had one episode of bright blood per rectum since arrival to the ER. Blood pressure currently 108/47, not tachycardic. Hemoglobin 8.5. Discussed with Dr. Anne Hahn, hospitalist for admission. He recommends recheck CBC prior to initiation of any transfusion. Repeat CBC ordered. Discussed with patient who is amenable to  admission.  ____________________________________________   FINAL CLINICAL IMPRESSION(S) / ED DIAGNOSES  Final diagnoses:  Acute lower GI bleeding     Gayla Doss, MD 03/12/15 2332

## 2015-03-13 ENCOUNTER — Inpatient Hospital Stay: Payer: Medicare Other

## 2015-03-13 ENCOUNTER — Encounter: Payer: Self-pay | Admitting: Radiology

## 2015-03-13 LAB — CBC
HCT: 22.5 % — ABNORMAL LOW (ref 35.0–47.0)
Hemoglobin: 7.4 g/dL — ABNORMAL LOW (ref 12.0–16.0)
MCH: 31.7 pg (ref 26.0–34.0)
MCHC: 32.7 g/dL (ref 32.0–36.0)
MCV: 97 fL (ref 80.0–100.0)
Platelets: 192 10*3/uL (ref 150–440)
RBC: 2.32 MIL/uL — ABNORMAL LOW (ref 3.80–5.20)
RDW: 17.3 % — AB (ref 11.5–14.5)
WBC: 5.4 10*3/uL (ref 3.6–11.0)

## 2015-03-13 LAB — BASIC METABOLIC PANEL
Anion gap: 5 (ref 5–15)
BUN: 13 mg/dL (ref 6–20)
CHLORIDE: 108 mmol/L (ref 101–111)
CO2: 27 mmol/L (ref 22–32)
CREATININE: 0.83 mg/dL (ref 0.44–1.00)
Calcium: 7.6 mg/dL — ABNORMAL LOW (ref 8.9–10.3)
GFR calc Af Amer: >60 mL/min (ref >60)
GFR calc non Af Amer: >60 mL/min (ref >60)
Glucose, Bld: 93 mg/dL (ref 65–99)
Potassium: 3 mmol/L — ABNORMAL LOW (ref 3.5–5.1)
Sodium: 140 mmol/L (ref 135–145)

## 2015-03-13 LAB — PREPARE RBC (CROSSMATCH)

## 2015-03-13 LAB — MAGNESIUM: Magnesium: 1.8 mg/dL (ref 1.7–2.4)

## 2015-03-13 LAB — GLUCOSE, CAPILLARY: GLUCOSE-CAPILLARY: 95 mg/dL (ref 70–99)

## 2015-03-13 LAB — HEMOGLOBIN
HEMOGLOBIN: 7.7 g/dL — AB (ref 12.0–16.0)
HEMOGLOBIN: 8 g/dL — AB (ref 12.0–16.0)
Hemoglobin: 6.7 g/dL — ABNORMAL LOW (ref 12.0–16.0)

## 2015-03-13 MED ORDER — SODIUM CHLORIDE 0.9 % IV SOLN
Freq: Once | INTRAVENOUS | Status: AC
  Administered 2015-03-13: 11:00:00 via INTRAVENOUS

## 2015-03-13 MED ORDER — SODIUM CHLORIDE 0.9 % IV SOLN
INTRAVENOUS | Status: DC
  Administered 2015-03-13: 02:00:00 via INTRAVENOUS

## 2015-03-13 MED ORDER — SODIUM CHLORIDE 0.9 % IV SOLN
Freq: Once | INTRAVENOUS | Status: AC
  Administered 2015-03-13: 12:00:00 via INTRAVENOUS

## 2015-03-13 MED ORDER — PANTOPRAZOLE SODIUM 40 MG IV SOLR
INTRAVENOUS | Status: AC
  Filled 2015-03-13: qty 80

## 2015-03-13 MED ORDER — SODIUM CHLORIDE 0.9 % IV SOLN
8.0000 mg/h | INTRAVENOUS | Status: AC
  Administered 2015-03-13 – 2015-03-15 (×5): 8 mg/h via INTRAVENOUS
  Filled 2015-03-13 (×7): qty 80

## 2015-03-13 MED ORDER — POTASSIUM CHLORIDE IN NACL 40-0.9 MEQ/L-% IV SOLN
INTRAVENOUS | Status: DC
  Administered 2015-03-13 – 2015-03-14 (×3): 100 mL/h via INTRAVENOUS
  Filled 2015-03-13 (×9): qty 1000

## 2015-03-13 MED ORDER — SODIUM CHLORIDE 0.9 % IV SOLN: Freq: Once | INTRAVENOUS | Status: DC

## 2015-03-13 MED ORDER — PANTOPRAZOLE SODIUM 40 MG IV SOLR: 40.0000 mg | Freq: Two times a day (BID) | INTRAVENOUS | Status: DC

## 2015-03-13 MED ORDER — FENTANYL CITRATE (PF) 100 MCG/2ML IJ SOLN
INTRAMUSCULAR | Status: AC
  Filled 2015-03-13: qty 4

## 2015-03-13 MED ORDER — POTASSIUM CHLORIDE 10 MEQ/100ML IV SOLN
10.0000 meq | INTRAVENOUS | Status: DC
Start: 2015-03-13 — End: 2015-03-13

## 2015-03-13 MED ORDER — ALBUTEROL SULFATE (2.5 MG/3ML) 0.083% IN NEBU
3.0000 mL | INHALATION_SOLUTION | Freq: Four times a day (QID) | RESPIRATORY_TRACT | Status: DC | PRN
  Administered 2015-03-15 – 2015-03-20 (×4): 3 mL via RESPIRATORY_TRACT
  Filled 2015-03-13 (×4): qty 3

## 2015-03-13 MED ORDER — SODIUM CHLORIDE 0.9 % IV SOLN
80.0000 mg | Freq: Once | INTRAVENOUS | Status: AC
  Administered 2015-03-13: 80 mg via INTRAVENOUS
  Filled 2015-03-13: qty 80

## 2015-03-13 MED ORDER — MORPHINE SULFATE 2 MG/ML IJ SOLN
2.0000 mg | Freq: Once | INTRAMUSCULAR | Status: AC
  Administered 2015-03-14: 2 mg via INTRAVENOUS
  Filled 2015-03-13: qty 1

## 2015-03-13 MED ORDER — TECHNETIUM TC 99M-LABELED RED BLOOD CELLS IV KIT
23.1100 | PACK | Freq: Once | INTRAVENOUS | Status: AC | PRN
  Administered 2015-03-13: 23.11 via INTRAVENOUS
  Filled 2015-03-13: qty 23

## 2015-03-13 NOTE — Consult Note (Signed)
Consultation  Referring Provider: Dr. Winona Legato  Primary Care Physician:  Kristie Cowman, MD Primary Gastroenterologist:         Reason for Consultation:GI bleed             HPI:   [name] is a 80 y.o. female who developed BRBPR last evening. She felt the urge to have a bowel movement at 7 pm. She sat on the toilet and passed what she thought was a normal formed stool. She continued to sit and pass a very large amount and thought it was odd. She looked and it was a very large liquid bloody stool. This was similar to what she experienced last spring when she a gi bleed on Eliquis. Her colonoscopy was performed by Dr. Servando Snare 02/22/2014 and was notable for 3 polyps and multiple small-mouthed diverticula in the entire colon.   She denies any recent or current abdominal pain, pressure, cramping, nausea/vomiting /wt loss. She may have a decreased appetite. She denies any blood thinners, aspirin or NSAID's.   Nursing reports no bleeding during the night shift last night. This am she passed 4 large bloody stools-mostly blood little or no stool. She had a drop in Hgb from 8.5 to 7.7 to 6.7 and is receiving her 2nd unit of PRBC. Vitals stable. She had a bleeding scan with no active bleeding reported. She says that she feels fine.   Past Medical History  Diagnosis Date  . Personal history of colonic polyps   . Chronic a-fib     a. CHA2DS2VASc = 5 (HTN, age > 32, DM2, female);  b. Eliquis d/c'd 4/015 in setting of BRBPR/GIB;  c. Rate-control w/ BB.  . Type II or unspecified type diabetes mellitus without mention of complication, not stated as uncontrolled   . Obesity, unspecified   . Hyperlipidemia   . HTN (hypertension)   . Cellulitis and abscess of leg, except foot   . History of pneumonia   . Plantar fascial fibromatosis   . Diverticulosis   . RBBB   . GIB (gastrointestinal bleeding)     a. 02/2014 BRBPR;  b. 02/2014 Colonoscopy/EGD: diverticulosis with polys/polypectomy - ascending colon,  Gastritis on EDG.  . Gastritis     a. 02/2014 EGD.  Marland Kitchen Anemia     a. 02/2014 in setting of GIB req 3U PRBC's.  . Atrial fibrillation 03/12/2015    Past Surgical History  Procedure Laterality Date  . No past surgeries      Family History  Problem Relation Age of Onset  . Asthma Father   . Emphysema Father   . Heart attack Father   . Hypertension Mother      History  Substance Use Topics  . Smoking status: Never Smoker   . Smokeless tobacco: Never Used  . Alcohol Use: No     Comment: occasional    Prior to Admission medications   Medication Sig Start Date End Date Taking? Authorizing Provider  albuterol (PROVENTIL HFA) 108 (90 BASE) MCG/ACT inhaler Inhale 2 puffs into the lungs every 6 (six) hours as needed.     Yes Historical Provider, MD  doxazosin (CARDURA) 4 MG tablet Take 1 tablet (4 mg total) by mouth daily. 10/25/14  Yes Antonieta Iba, MD  ferrous sulfate 325 (65 FE) MG tablet Take 325 mg by mouth daily with breakfast.   Yes Historical Provider, MD  fluticasone (FLONASE) 50 MCG/ACT nasal spray Place 2 sprays into the nose 2 (two) times daily.  12/25/11  Yes  Historical Provider, MD  furosemide (LASIX) 40 MG tablet Take 40 mg by mouth daily as needed.    Yes Historical Provider, MD  metoprolol (LOPRESSOR) 100 MG tablet Take 1 tablet (100 mg total) by mouth 2 (two) times daily. 1 by mouth twice daily 11/24/14  Yes Antonieta Ibaimothy J Gollan, MD  omeprazole (PRILOSEC) 20 MG capsule Take 20 mg by mouth daily.  03/17/14  Yes Historical Provider, MD    Current Facility-Administered Medications  Medication Dose Route Frequency Provider Last Rate Last Dose  . 0.9 % NaCl with KCl 40 mEq / L  infusion   Intravenous Continuous Oralia Manisavid Willis, MD 100 mL/hr at 03/13/15 1600    . albuterol (PROVENTIL) (2.5 MG/3ML) 0.083% nebulizer solution 3 mL  3 mL Inhalation Q6H PRN Oralia Manisavid Willis, MD      . fentaNYL (SUBLIMAZE) 100 MCG/2ML injection           . pantoprazole (PROTONIX) 80 mg in sodium chloride 0.9  % 250 mL (0.32 mg/mL) infusion  8 mg/hr Intravenous Continuous Oralia Manisavid Willis, MD 25 mL/hr at 03/13/15 1600 8 mg/hr at 03/13/15 1600    Allergies as of 03/12/2015 - Review Complete 03/12/2015  Allergen Reaction Noted  . Diltiazem hcl       Review of Systems:    A 12 system review was obtained and all negative except where noted in HPI.    Physical Exam:  Vital signs in last 24 hours: Temp:  [97.5 F (36.4 C)-98.6 F (37 C)] 98.3 F (36.8 C) (05/02 1600) Pulse Rate:  [56-111] 111 (05/02 1600) Resp:  [12-29] 19 (05/02 1600) BP: (86-119)/(37-91) 100/43 mmHg (05/02 1600) SpO2:  [97 %-100 %] 100 % (05/02 1600) Weight:  [95.709 kg (211 lb)-97.8 kg (215 lb 9.8 oz)] 97.8 kg (215 lb 9.8 oz) (05/02 0129) Last BM Date: 03/13/15  General:  Well-developed, well-nourished and in no acute distress Head:  Head without obvious abnormality, atraumatic  Eyes:   Conjunctiva pale pink, sclera anicteric   ENT:   Mouth free of lesions, mucosa moist, tongue pink, no thrush noted, teeth and gums normal Neck:   Supple w/o thyromegaly or mass, trachea midline, no adenopathy  Lungs: Clear to auscultation bilaterally, respirations unlabored Heart:     Normal a fib irreg, irreg S1S2, no rubs, murmurs, gallops. Abdomen: Soft, non-tender in all quadrants, no hepatosplenomegaly, hernia, or mass and BS normal Rectal: Deferred Lymph:  No cervical or supraclavicular adenopathy. Extremities:   No edema, cyanosis, or clubbing Skin  Skin color, texture, turgor normal, no rashes or lesions Neuro:  A&O x 3. CNII-XII intact, normal strength Psych:  Appropriate mood and affect.  Data Reviewed:  LAB RESULTS:  Recent Labs  03/12/15 2128  03/13/15 0647 03/13/15 0856  WBC 5.1  --  5.4  --   HGB 8.5*  < > 7.4* 6.7*  HCT 26.1*  --  22.5*  --   PLT 248  --  192  --   < > = values in this interval not displayed. BMET  Recent Labs  03/12/15 2128 03/13/15 0647  NA 140 140  K 2.9* 3.0*  CL 105 108  CO2 28 27   GLUCOSE 168* 93  BUN 13 13  CREATININE 0.98 0.83  CALCIUM 8.0* 7.6*   LFT  Recent Labs  03/12/15 2128  PROT 5.6*  ALBUMIN 2.9*  AST 19  ALT 7*  ALKPHOS 78  BILITOT 1.0   PT/INR No results for input(s): LABPROT, INR in the last 72 hours.  STUDIES: Dg Chest Portable 1 View  03/13/2015   CLINICAL DATA:  Chest pain and short of breath, rectal bleeding.  EXAM: PORTABLE CHEST - 1 VIEW  COMPARISON:  Radiograph 07/27/2010  FINDINGS: Stable enlarged cardiac silhouette. There is a right pleural effusion. The right lower lobe is not well evaluated. Upper lungs are clear.  IMPRESSION: Right pleural effusion with right lower lobe atelectasis versus infiltrate. Recommend follow-up radiographs to ensure resolution of unilateral effusion. If effusion persists, consider CT of the thorax with contrast.   Electronically Signed   By: Genevive Bi M.D.   On: 03/13/2015 00:07     Assessment:  [name] is a 80 y.o. female in for GI bleed with etiology likely diverticular in nature. Recent colonoscopy last year with extensive small mouthed diverticula in the entire colon. Negative active bleeding per scan.  No further bloody stools since this am.   Plan:  Close ICU monitoring with vitals, serial  Hgb. Vascular consult .No recommendations for a repeat colonoscopy at this present time.   This case was discussed with Dr. Scot Jun in collaboration of care. Thank you for the consultation.  These services provided by Amedeo Kinsman RN, MSN, ANP-BC under collaborative practice agreement with Scot Jun, MD.  03/13/2015, 4:36 PM

## 2015-03-13 NOTE — Plan of Care (Signed)
Problem: Phase I Progression Outcomes Goal: Voiding-avoid urinary catheter unless indicated Outcome: Not Met (add Reason) Pt with urinary retention, foley catheter inserted.

## 2015-03-13 NOTE — Consult Note (Signed)
Pt admitted for LGI bleed with BRBPR and a hx of multiple diverticulosis on previous colonoscopy.  A bleeding scan today showed no active bleeding and she reports no bleeding since this morning.  Hgb was 7.5 now up to 8.5.  See [place] consult for full note.  Likely the bleeding has stopped and will not return.  Usually after 48 hours of no bleeding the odd of restarting are small.  Please keep her on clear liq diet and do not advance to full liquids til at least 48 hours have passed since last bleeding.  No plans to scope her.  If active fresh  bleeding reoccurs please have vascular surgery see her ASAP.

## 2015-03-13 NOTE — Consult Note (Signed)
Please see dictated consult note Unable to put full consult note in new system Patient with brisk GI bleed on anticoagulation with Eliquis for A. Fib.  Has known history of diverticulosis.  No pain.  For bleeding scan today.

## 2015-03-13 NOTE — Progress Notes (Signed)
Patient ID: [name], female   DOB: 1935/03/22, 80 y.o.   MRN: [id number] Patient Demographics  [name], is a 80 y.o. female, DOB - [birth year], [id number]  Admit date - 03/12/2015   Admitting Physician Oralia Manis, MD  Outpatient Primary MD for the patient is Kristie Cowman, MD    Chief Complaint  Patient presents with  . Rectal Bleeding    pt reports dark red blood and blood clots in stool        [name] is a 80 y.o. female who presents to the ED tonight after developing rectal bleeding. She states of the first bloody stool happened around 7 PM on the evening of admission. She states that she is passing frank blood along with some darker clots. Subjective:   Jaiya Mooradian today has, No headache, No chest pain, No abdominal pain - No Nausea, No new weakness tingling or numbness, No Cough - SOB. Still bleeding large amount of bleeding over past few hours, bleeding scan ordered, to be done in pm, transfusing PRBC now, HGb is 6.7, hypotesnive, tachycardic  Objective:   Filed Vitals:   03/13/15 1058 03/13/15 1100 03/13/15 1108 03/13/15 1123  BP: 106/38 106/38 91/44 90/41   Pulse: 91 89 92 105  Temp: 97.8 F (36.6 C)  97.7 F (36.5 C) 97.7 F (36.5 C)  TempSrc: Oral  Oral Oral  Resp: Height:      Weight:      SpO2: 99% 99% 98% 99%    Wt Readings from Last 3 Encounters:  03/13/15 97.8 kg (215 lb 9.8 oz)  10/25/14 93.895 kg (207 lb)  04/28/14 96.843 kg (213 lb 8 oz)     Intake/Output Summary (Last 24 hours) at 03/13/15 1152 Last data filed at 03/13/15 0600  Gross per 24 hour  Intake    775 ml  Output      0 ml  Net    775 ml    ROS:  CONSTITUTIONAL: No documented fever. No fatigue and weakness. No weight gain or weight loss.  EYES: No blurry or double vision.  EARS, NOSE, THROAT: No tinnitus. No postnasal drip. No redness of the oropharynx.  RESPIRATORY:  No cough. No wheeze. No hemoptysis. Positive dyspnea.  CARDIOVASCULAR: No chest pain. No  orthopnea. No peripheral edema. No dyspnea on exertion. No palpitation or syncope.  GASTROINTESTINAL: No nausea, no vomiting, no diarrhea. No abdominal pain. No melena or hematochezia.  GENITOURINARY: No dysuria or hematuria.  ENDOCRINE: No polyuria, nocturia, heat or cold intolerance.   HEMATOLOGIC:  No anemia, no bruising, no bleeding.  INTEGUMENTARY: No rashes. No lesions.  MUSCULOSKELETAL: No arthritis, no swelling, no gout.  NEUROLOGIC: No numbness or tingling. No ataxia. No seizure-type activity.  PSYCHIATRIC: No anxiety, no insomnia. No ADD.    Physical Exam   Gen - Awake Alert, Oriented X 3 HEENT - AT, ,PERRL, moist oral mucosa.   Neck - Supple Neck,No JVD, No cervical lymphadenopathy appriciated.  Lung - Symmetrical Chest wall movement, Good air movement bilaterally, CTAB Heart - RRR,No Gallops,Rubs or new Murmurs, No Parasternal Heave Abg - +ve B.Sounds, Abd Soft, No tenderness, No organomegaly appriciated, No rebound - guarding or rigidity. Ext - No Cyanosis, Clubbing or edema, No new Rash or bruise  Neuro - AAO X 3, no focal motor or sensory defecits b/l  Data Review   Micro Results No results found for this or any previous visit (from the past 240 hour(s)).  Radiology Reports  Dg Chest Portable 1 View  03/13/2015   CLINICAL DATA:  Chest pain and short of breath, rectal bleeding.  EXAM: PORTABLE CHEST - 1 VIEW  COMPARISON:  Radiograph 07/27/2010  FINDINGS: Stable enlarged cardiac silhouette. There is a right pleural effusion. The right lower lobe is not well evaluated. Upper lungs are clear.  IMPRESSION: Right pleural effusion with right lower lobe atelectasis versus infiltrate. Recommend follow-up radiographs to ensure resolution of unilateral effusion. If effusion persists, consider CT of the thorax with contrast.   Electronically Signed   By: Genevive BiStewart  Edmunds M.D.   On: 03/13/2015 00:07    CBC  Recent Labs Lab 03/12/15 2128 03/13/15 0154 03/13/15 0647  03/13/15 0856  WBC 5.1  --  5.4  --   HGB 8.5* 7.7* 7.4* 6.7*  HCT 26.1*  --  22.5*  --   PLT 248  --  192  --   MCV 97.7  --  97.0  --   MCH 31.9  --  31.7  --   MCHC 32.6  --  32.7  --   RDW 17.6*  --  17.3*  --   LYMPHSABS 0.9*  --   --   --   MONOABS 0.5  --   --   --   EOSABS 0.2  --   --   --   BASOSABS 0.1  --   --   --     Chemistries   Recent Labs Lab 03/12/15 2128 03/13/15 0647  NA 140 140  K 2.9* 3.0*  CL 105 108  CO2 28 27  GLUCOSE 168* 93  BUN 13 13  CREATININE 0.98 0.83  CALCIUM 8.0* 7.6*  MG  --  1.8  AST 19  --   ALT 7*  --   ALKPHOS 78  --   BILITOT 1.0  --    ------------------------------------------------------------------------------------------------------------------ estimated creatinine clearance is 63.4 mL/min (by C-G formula based on Cr of 0.83). ------------------------------------------------------------------------------------------------------------------ No results for input(s): HGBA1C in the last 72 hours. ------------------------------------------------------------------------------------------------------------------ No results for input(s): CHOL, HDL, LDLCALC, TRIG, CHOLHDL, LDLDIRECT in the last 72 hours. ------------------------------------------------------------------------------------------------------------------ No results for input(s): TSH, T4TOTAL, T3FREE, THYROIDAB in the last 72 hours.  Invalid input(s): FREET3 ------------------------------------------------------------------------------------------------------------------ No results for input(s): VITAMINB12, FOLATE, FERRITIN, TIBC, IRON, RETICCTPCT in the last 72 hours.  Coagulation profile No results for input(s): INR, PROTIME in the last 168 hours.  No results for input(s): DDIMER in the last 72 hours.  Cardiac Enzymes  Recent Labs Lab 03/12/15 2128  TROPONINI <0.03    ------------------------------------------------------------------------------------------------------------------ Invalid input(s): POCBNP       Assessment & Plan   Principal Problem:   1. Acute posthemorrhagic anemia, transfuse blood , following HGb after transfusion closely  2.  GI bleed, likely diverticular, get vascular consult if bleeding area is seen on bleeding scan today, continue PPI, GI consult is requested  3  Hypotension, due to blood loss, transfuse, give IVf, keeping MAP at 65 and following HGb closely, holding BP meds  4.  Atrial fibrillation, now off Eliquis, tachycardic due to volume loss, transfuse, give IVF, following HR closely   5. H/o  Diverticulosis with likely diverticular bleed , as above  6. Chronic diastolic CHF (congestive heart failure), stable, no exacerbation, watch with transfusions, oxygenation is good on RA          Medications  Scheduled Meds:  Continuous Infusions: . 0.9 % NaCl with KCl 40 mEq / L 100 mL/hr (03/13/15 0411)  . pantoprozole (PROTONIX)  infusion 8 mg/hr (03/13/15 0216)   PRN Meds:.albuterol   DVT Prophylaxis  SCDs    Code Status: full  Family Communication: no family at the bedsite  Disposition Plan: home   Critical Time Spent in minutes   45 min   Kamdyn Colborn M.D on 03/13/2015 at 11:52 AM  [facility]  [phone]

## 2015-03-13 NOTE — H&P (Addendum)
[facility]  Julie AmmonsJoanne S [name] VHQ:[id number]:[id number] DOB: 1935/01/10 DOA: 03/12/2015   ED/Referring physician: Inocencio HomesGayle PCP: Julie CowmanSCHOOLER, KAREN, MD   Chief Complaint: Rectal bleeding  HPI: Julie AmmonsJoanne S [name] is a 80 y.o. female who presents to the ED tonight after developing rectal bleeding. She states of the first bloody stool happened around 7 PM on the evening of admission. She states that she is passing frank blood along with some darker clots. She has had a prior GI bleed one year ago, and has a history of diverticulosis. Notably she has a history of chronic A. fib and was on Eliquis at the time of her prior GI bleed. After that episode she and her cardiologist, Dr. Mariah [name], decided that she would stop taking medication, and so she has not taken it for the past 8-9 months. Patient denies any significant associated symptoms, such as abdominal pain nausea vomiting recent fevers or chills. See full review of systems below. In the ED she was found to have hemoglobin of 8.5, this is from what seems to be a prior baseline of 11-12. Hospitals were called for admission for GI bleed. Patient's blood pressure is borderline low, but stable at this time.  Past Medical History  Diagnosis Date  . Personal history of colonic polyps   . Chronic a-fib     a. CHA2DS2VASc = 5 (HTN, age > 4775, DM2, female);  b. Eliquis d/c'd 4/015 in setting of BRBPR/GIB;  c. Rate-control w/ BB.  . Type II or unspecified type diabetes mellitus without mention of complication, not stated as uncontrolled   . Obesity, unspecified   . Hyperlipidemia   . HTN (hypertension)   . Cellulitis and abscess of leg, except foot   . History of pneumonia   . Plantar fascial fibromatosis   . Diverticulosis   . RBBB   . GIB (gastrointestinal bleeding)     a. 02/2014 BRBPR;  b. 02/2014 Colonoscopy/EGD: diverticulosis with polys/polypectomy - ascending colon, Gastritis on EDG.  . Gastritis     a. 02/2014 EGD.  Marland Kitchen. Anemia     a. 02/2014  in setting of GIB req 3U PRBC's.  . Atrial fibrillation 03/12/2015    Past Surgical History  Procedure Laterality Date  . No past surgeries      Family History  Problem Relation Age of Onset  . Asthma Father   . Emphysema Father   . Heart attack Father   . Hypertension Mother     Social History  reports that she has never smoked. She has never used smokeless tobacco. She reports that she does not drink alcohol or use illicit drugs.  Allergies  Allergen Reactions  . Diltiazem Hcl     REACTION: SOB/swelling    Prior to Admission medications   Medication Sig Start Date End Date Taking? Authorizing Provider  albuterol (PROVENTIL HFA) 108 (90 BASE) MCG/ACT inhaler Inhale 2 puffs into the lungs every 6 (six) hours as needed.      Historical Provider, MD  doxazosin (CARDURA) 4 MG tablet Take 1 tablet (4 mg total) by mouth daily. 10/25/14   Julie Ibaimothy J Gollan, MD  ferrous sulfate 325 (65 FE) MG tablet Take 325 mg by mouth daily with breakfast.    Historical Provider, MD  fluticasone (FLONASE) 50 MCG/ACT nasal spray Place 2 sprays into the nose 2 (two) times daily.  12/25/11   Historical Provider, MD  furosemide (LASIX) 40 MG tablet Take 40 mg by mouth daily as needed.  Historical Provider, MD  metoprolol (LOPRESSOR) 100 MG tablet Take 1 tablet (100 mg total) by mouth 2 (two) times daily. 1 by mouth twice daily 11/24/14   Julie Iba, MD  omeprazole (PRILOSEC) 20 MG capsule Take 20 mg by mouth daily.  03/17/14   Historical Provider, MD  potassium chloride (KLOR-CON) 10 MEQ CR tablet Take 20 mEq by mouth daily.     Historical Provider, MD  simvastatin (ZOCOR) 20 MG tablet Take 20 mg by mouth daily.    Historical Provider, MD    Review of Systems:  Constitutional: No fevers/chills, fatigue, weakness, weight changes.  Eyes: No blurred or double vision, pain, redness ENT: No ear pain, hearing loss, discharge, difficulty swallowing Resp: No cough, wheeze, hemoptysis,  dyspnea Cardio-vascular: No chest pain, orthopnea, edema, palpitations, syncope GI: Frank bloody stool, no nausea/vomiting/diarrhea, abdominal pain, hematemesis GU: No dysuria, hematuria, frequency, incontinence Endocrine: No nocturia, thyroid problems, heat or cold intolerance, thirst Hematologic/Lymphatic: No anemia, easy bruising bleeding, swollen glands Integumentary: No acne, rash, lesions, change in mole-hair-skin  Musculoskeletal: No pain in: neck-back-shoulder-knee-hip, arthritis, joint swelling, gout Neuro: No numbness, weakness, dysarthria, ataxia, headache, migraine, seizure Psych: No anxiety, insomnia, mania, depression  Physical Exam: Constitutional: Filed Vitals:   03/12/15 2131 03/12/15 2200 03/12/15 2230 03/12/15 2300  BP: 93/40  97/48 97/42  Pulse: 71 77 67 72  Temp: 98.6 F (37 C)     TempSrc: Oral     Resp:  Height:  (1.626 m)     Weight: 95.709 kg (211 lb)     SpO2: 100% 100% 100% 100%   Wt Readings from Last 3 Encounters:  03/12/15 95.709 kg (211 lb)  10/25/14 93.895 kg (207 lb)  04/28/14 96.843 kg (213 lb 8 oz)   General:  Well dressed, well nourished, in no apparent distress HEENT: PERRL, EOMI, no scleral icterus, no conjunctivitis, no difficulty hearing Neck: supple, no masses, non tender, no cervical adenopathy, no JVD, thyroid not enlarged Cardiovascular: Irregular, controlled rate, no m/r/g, no S3/S4, good pedal pulses, 2+ bilateral LE edema. Respiratory: CTA bilaterally, no wheeze, no rales, no ronchi, breath sounds not diminished, no increased respiratory effort Abdomen: soft, nontender, nondistended, no mass, bowel sounds present, Frank bloody stool present in commode Musculoskeletal: 5/5 muscular strength x4 extremities, full spontaneous range of motion throughout, no cyanosis/clubbing Skin: no rash, no lesions, no erythema, warm and dry  Lymphatic: No adenopathy Neurologic: Cranial nerves intact, sensation intact, no dysarthria, no  aphasia Psychiatric: Alert, Oriented to time, person, place, circumstance, cooperative, not confused, not agitated, not depressed         Labs on Admission:  Basic Metabolic Panel:  Recent Labs Lab 03/12/15 2128  NA 140  K 2.9*  CL 105  CO2 28  GLUCOSE 168*  BUN 13  CREATININE 0.98  CALCIUM 8.0*   Liver Function Tests:  Recent Labs Lab 03/12/15 2128  AST 19  ALT 7*  ALKPHOS 78  BILITOT 1.0  PROT 5.6*  ALBUMIN 2.9*   No results for input(s): LIPASE, AMYLASE in the last 168 hours. No results for input(s): AMMONIA in the last 168 hours. CBC:  Recent Labs Lab 03/12/15 2128  WBC 5.1  NEUTROABS 3.4  HGB 8.5*  HCT 26.1*  MCV 97.7  PLT 248   Cardiac Enzymes:  Recent Labs Lab 03/12/15 2128  TROPONINI <0.03   BNP (last 3 results) No results for input(s): BNP in the last 8760 hours.  ProBNP (last 3 results) No  results for input(s): PROBNP in the last 8760 hours.  CBG: No results for input(s): GLUCAP in the last 168 hours.  Radiological Exams on Admission: No results found.   Assessment/Plan Principal Problem:   GI bleed - patient will be admitted to stepdown unit, with fluids running, Protonix drip, kept nothing by mouth, with hemoglobin monitoring every 4 hours, 2 units typed and crossed and ready for transfusion. We'll get a GI consult. Active Problems:   Essential hypertension - patient's blood pressure is borderline low at this time, so we will hold her home medications for this. They can be restarted as needed   Atrial fibrillation - currently rate controlled, we can use IV rate controlling medications if necessary. Although with her borderline low blood pressure this may be tenuous. Patient has elected not to use any anticoagulation.   Diverticulosis - unclear of her bleed is upper or lower GI tract at this time. It is possibly of lower GI tract origin related to her diverticulosis.   Chronic diastolic CHF (congestive heart failure) - we're holding  her beta blocker at this time due to borderline low blood pressure, this can be restarted as soon as her hemodynamics are more stable.   Code Status: Full DVT Prophylaxis: mechanical only due to GI bleed  Time spent on admission: 50 minutes.  Kristeen Miss [facility] 03/13/2015, 12:01 AM

## 2015-03-13 NOTE — Care Management Note (Signed)
Patient presents from home with significant lower gi bleed.  GI and vascular consults.  She is requiring transfusions.

## 2015-03-14 ENCOUNTER — Inpatient Hospital Stay: Payer: Medicare Other

## 2015-03-14 LAB — HEMOGLOBIN AND HEMATOCRIT, BLOOD
HEMATOCRIT: 23.8 % — AB (ref 35.0–47.0)
HEMOGLOBIN: 7.6 g/dL — AB (ref 12.0–16.0)

## 2015-03-14 LAB — PREPARE RBC (CROSSMATCH)

## 2015-03-14 LAB — CBC WITH DIFFERENTIAL/PLATELET
Basophils Absolute: 0.1 10*3/uL (ref 0–0.1)
Basophils Relative: 1 %
EOS ABS: 0.1 10*3/uL (ref 0–0.7)
HCT: 20.9 % — ABNORMAL LOW (ref 35.0–47.0)
HEMOGLOBIN: 6.9 g/dL — AB (ref 12.0–16.0)
Lymphocytes Relative: 21 %
Lymphs Abs: 2 10*3/uL (ref 1.0–3.6)
MCH: 30.4 pg (ref 26.0–34.0)
MCHC: 32.9 g/dL (ref 32.0–36.0)
MCV: 92.4 fL (ref 80.0–100.0)
Monocytes Absolute: 0.9 10*3/uL (ref 0.2–0.9)
Neutro Abs: 6.7 10*3/uL — ABNORMAL HIGH (ref 1.4–6.5)
Neutrophils Relative %: 68 %
Platelets: 194 10*3/uL (ref 150–440)
RBC: 2.26 MIL/uL — ABNORMAL LOW (ref 3.80–5.20)
RDW: 19.3 % — ABNORMAL HIGH (ref 11.5–14.5)
WBC: 9.7 10*3/uL (ref 3.6–11.0)

## 2015-03-14 LAB — BASIC METABOLIC PANEL
Anion gap: 3 — ABNORMAL LOW (ref 5–15)
BUN: 15 mg/dL (ref 6–20)
CO2: 24 mmol/L (ref 22–32)
Calcium: 7.4 mg/dL — ABNORMAL LOW (ref 8.9–10.3)
Chloride: 118 mmol/L — ABNORMAL HIGH (ref 101–111)
Creatinine, Ser: 0.83 mg/dL (ref 0.44–1.00)
GFR calc Af Amer: >60 mL/min (ref >60)
GFR calc non Af Amer: >60 mL/min (ref >60)
Glucose, Bld: 125 mg/dL — ABNORMAL HIGH (ref 65–99)
Potassium: 3.7 mmol/L (ref 3.5–5.1)
SODIUM: 145 mmol/L (ref 135–145)

## 2015-03-14 MED ORDER — TECHNETIUM TC 99M-LABELED RED BLOOD CELLS IV KIT
20.0000 | PACK | Freq: Once | INTRAVENOUS | Status: AC | PRN
  Administered 2015-03-14: 22.68 via INTRAVENOUS

## 2015-03-14 MED ORDER — METOPROLOL TARTRATE 1 MG/ML IV SOLN
5.0000 mg | Freq: Four times a day (QID) | INTRAVENOUS | Status: DC
  Administered 2015-03-14: 5 mg via INTRAVENOUS
  Filled 2015-03-14: qty 5

## 2015-03-14 MED ORDER — SODIUM CHLORIDE 0.9 % IV SOLN
Freq: Once | INTRAVENOUS | Status: AC
Start: 2015-03-14 — End: 2015-03-14
  Administered 2015-03-14: 05:00:00 via INTRAVENOUS

## 2015-03-14 MED ORDER — LABETALOL HCL 5 MG/ML IV SOLN
10.0000 mg | Freq: Once | INTRAVENOUS | Status: AC
  Administered 2015-03-14: 10 mg via INTRAVENOUS
  Filled 2015-03-14: qty 4

## 2015-03-14 MED ORDER — ONDANSETRON HCL 4 MG/2ML IJ SOLN
4.0000 mg | INTRAMUSCULAR | Status: DC | PRN
Start: 2015-03-14 — End: 2015-03-20
  Administered 2015-03-14: 4 mg via INTRAVENOUS

## 2015-03-14 MED ORDER — DIGOXIN 0.25 MG/ML IJ SOLN
0.2500 mg | Freq: Every day | INTRAMUSCULAR | Status: DC
  Administered 2015-03-14: 0.25 mg via INTRAVENOUS
  Filled 2015-03-14: qty 2

## 2015-03-14 MED ORDER — ONDANSETRON HCL 4 MG/2ML IJ SOLN
INTRAMUSCULAR | Status: AC
  Administered 2015-03-14: 4 mg via INTRAVENOUS
  Filled 2015-03-14: qty 2

## 2015-03-14 MED ORDER — ONDANSETRON HCL 4 MG/2ML IJ SOLN
4.0000 mg | Freq: Once | INTRAMUSCULAR | Status: AC
  Administered 2015-03-14: 4 mg via INTRAVENOUS

## 2015-03-14 NOTE — Progress Notes (Signed)
Patient ID: [name], female   DOB: May 15, 1935, 80 y.o.   MRN: [id number] [facility] [place] [facility]   PATIENT NAME: [name]    MR#:  [id number]  DATE OF BIRTH:  02/22/1935  SUBJECTIVE:  CHIEF COMPLAINT:   Chief Complaint  Patient presents with  . Rectal Bleeding    pt reports dark red blood and blood clots in stool    who presents to the ED tonight after developing rectal bleeding. She states of the first bloody stool happened around 7 PM on the evening of admission. She states that she is passing frank blood along with some darker clots. Continued to have bleeding last night and developed a. Fib , tachycardia , hypotension last night, after which 2 more units of PRBC were ordered, one transfused. Feels satisfactrory today though. NM test y-day negative, HGb 6.9 again in am before transfusion REVIEW OF SYSTEMS:   CONSTITUTIONAL: No fever, fatigue or weakness.  EYES: No blurred or double vision.  EARS, NOSE, AND THROAT: No tinnitus or ear pain.  RESPIRATORY: No cough, shortness of breath, wheezing or hemoptysis.  CARDIOVASCULAR: No chest pain, orthopnea, edema.  GASTROINTESTINAL: No nausea, vomiting, diarrhea or abdominal pain.  GENITOURINARY: No dysuria, hematuria.  ENDOCRINE: No polyuria, nocturia,  HEMATOLOGY: No anemia, easy bruising or bleeding SKIN: No rash or lesion. MUSCULOSKELETAL: No joint pain or arthritis.   NEUROLOGIC: No tingling, numbness, weakness.  PSYCHIATRY: No anxiety or depression.   VITAL SIGNS: Blood pressure 87/59, pulse 126, temperature 98 F (36.7 C), temperature source Oral, resp. rate 19, height  (1.626 m), weight 97.8 kg (215 lb 9.8 oz), SpO2 99 %.  PHYSICAL EXAMINATION:   GENERAL:  80 y.o.-year-old patient lying in the bed with no acute distress. Pale. Dry oral mucosa.  EYES: Pupils equal, round, reactive to light and accommodation. No scleral icterus. Extraocular muscles intact.  HEENT: Head atraumatic,  normocephalic. Oropharynx and nasopharynx clear.  NECK:  Supple, no jugular venous distention. No thyroid enlargement, no tenderness.  LUNGS: Normal breath sounds bilaterally, no wheezing, rales,rhonchi or crepitation. No use of accessory muscles of respiration.  CARDIOVASCULAR: S1, S2 normal. No murmurs, rubs, or gallops.  ABDOMEN: Soft, nontender, nondistended. Bowel sounds present. No organomegaly or mass.  EXTREMITIES: No pedal edema, cyanosis, or clubbing.  NEUROLOGIC: Cranial nerves II through XII are intact. Muscle strength 5/5 in all extremities. Sensation intact. Gait not checked.  PSYCHIATRIC: The patient is alert and oriented x 3.  SKIN: No obvious rash, lesion, or ulcer.   ORDERS/RESULTS REVIEWED:   CBC  Recent Labs Lab 03/12/15 2128 03/13/15 0154 03/13/15 0647 03/13/15 0856 03/13/15 2200 03/14/15 0315  WBC 5.1  --  5.4  --   --  9.7  HGB 8.5* 7.7* 7.4* 6.7* 8.0* 6.9*  HCT 26.1*  --  22.5*  --   --  20.9*  PLT 248  --  192  --   --  194  MCV 97.7  --  97.0  --   --  92.4  MCH 31.9  --  31.7  --   --  30.4  MCHC 32.6  --  32.7  --   --  32.9  RDW 17.6*  --  17.3*  --   --  19.3*  LYMPHSABS 0.9*  --   --   --   --  2.0  MONOABS 0.5  --   --   --   --  0.9  EOSABS 0.2  --   --   --   --  0.1  BASOSABS 0.1  --   --   --   --  0.1   ------------------------------------------------------------------------------------------------------------------  Chemistries   Recent Labs Lab 03/12/15 2128 03/13/15 0647 03/14/15 0315  NA 140 140 145  K 2.9* 3.0* 3.7  CL 105 108 118*  CO2 28 27 24   GLUCOSE 168* 93 125*  BUN 13 13 15   CREATININE 0.98 0.83 0.83  CALCIUM 8.0* 7.6* 7.4*  MG  --  1.8  --   AST 19  --   --   ALT 7*  --   --   ALKPHOS 78  --   --   BILITOT 1.0  --   --    ------------------------------------------------------------------------------------------------------------------ estimated creatinine clearance is 63.4 mL/min (by C-G formula based on Cr  of 0.83). ------------------------------------------------------------------------------------------------------------------ No results for input(s): TSH, T4TOTAL, T3FREE, THYROIDAB in the last 72 hours.  Invalid input(s): FREET3  Cardiac Enzymes  Recent Labs Lab 03/12/15 2128  TROPONINI <0.03   ------------------------------------------------------------------------------------------------------------------ Invalid input(s): POCBNP ---------------------------------------------------------------------------------------------------------------  RADIOLOGY: Nm Gi Blood Loss  03/13/2015   CLINICAL DATA:  Bloody stools. History of diverticulitis. Blood transfusion.  EXAM: NUCLEAR MEDICINE GASTROINTESTINAL BLEEDING SCAN  TECHNIQUE: Sequential abdominal images were obtained following intravenous administration of Tc-3237m labeled red blood cells.  RADIOPHARMACEUTICALS:  23.1 mCi Tc-7437m in-vitro labeled red cells.  COMPARISON:  None.  FINDINGS: Imaging was performed over 2 hours.  Blood pool activity noted. On later images there is some bladder activity likely related to free technetium. However, no definite bowel activity is appreciated.  IMPRESSION: 1. No active bleeding during the course of the exam.   Electronically Signed   By: Gaylyn RongWalter  Liebkemann M.D.   On: 03/13/2015 16:33   Dg Chest Portable 1 View  03/13/2015   CLINICAL DATA:  Chest pain and short of breath, rectal bleeding.  EXAM: PORTABLE CHEST - 1 VIEW  COMPARISON:  Radiograph 07/27/2010  FINDINGS: Stable enlarged cardiac silhouette. There is a right pleural effusion. The right lower lobe is not well evaluated. Upper lungs are clear.  IMPRESSION: Right pleural effusion with right lower lobe atelectasis versus infiltrate. Recommend follow-up radiographs to ensure resolution of unilateral effusion. If effusion persists, consider CT of the thorax with contrast.   Electronically Signed   By: Genevive BiStewart  Edmunds M.D.   On: 03/13/2015 00:07    EKG:   Orders placed or performed during the hospital encounter of 03/12/15  . ED EKG  . ED EKG  . EKG 12-Lead  . EKG 12-Lead    ASSESSMENT AND PLAN:    1. Acute posthemorrhagic anemia, transfuse 2 more PRBC units, following HGb later today, d/w DR Mechele CollinElliott , he would like pt to have HGb above 8 to precede to EGD, continue transfusion, following HGb after transfusion , getting NM testing repeated if bleeding recurs.   2. GI bleed, suspected diverticular, appreciate GI and  vascular inputs, unfortunately no active bleeding seen on RBC tagged scan y-day, repeat testing today, if bleeding recurs,  continue PPI, GI consult is requested  3 Hypotension, due to blood loss, transfusing, continue IVf, keeping MAP at 65 and following HGb closely, holding BP meds, stable clinically.   4. Atrial fibrillation, now off Eliquis, tachycardic due to volume loss, transfusing, continue IVF, following HR closely, add digoxin IV  5. H/o Diverticulosis with likely diverticular bleed , as above  6. Chronic diastolic CHF (congestive heart failure), stable, no exacerbation, watching  with transfusions, oxygenation is good on RA  DRUG ALLERGIES:  Allergies  Allergen  Reactions  . Diltiazem Hcl     REACTION: SOB/swelling    CODE STATUS:     Code Status Orders        Start     Ordered   03/13/15 0119  Full code   Continuous     03/13/15 0118         Cotton Beckley M.D on 03/14/2015 at 10:58 AM  Between 7am to 6pm - Pager - [phone]  After 6pm go to [email or web address] - password [email or web address] [facility]  Office  [phone]    Critical Time Spent in minutes   45 min   Shundra Wirsing M.D on 03/14/2015 at 10:58 AM  [facility] Office  [phone]

## 2015-03-14 NOTE — Progress Notes (Signed)
Update:  Became tachycardic, afib overnight, had another large bloody BM with Hgb dropping back to 6.9.  SBP 90s-low 110s.  2 more units PRBC ordered and lopressor 5mg  IV q6 hour ordered to be held it SBP <100 for afib rate control (pt is on lopressor at home but this has been held as she has been NPO).

## 2015-03-14 NOTE — Consult Note (Signed)
[name], MCCONATHY NO.:  [id number]  MEDICAL RECORD NO.:  [id number]  LOCATION:  IC13A                        FACILITY:  [facility]  PHYSICIAN:  Annice Needy, MD        DATE OF BIRTH:  02-04-1935  DATE OF CONSULTATION:  03/13/2015 DATE OF DISCHARGE:                                CONSULTATION   REASON FOR CONSULTATION/CHIEF COMPLAINT:  Rectal bleeding.  HISTORY OF PRESENT ILLNESS:  This is a 80 year old female who presented to the emergency room late last night after beginning to develop bloody stools.  She says the first bloody stool happened about 7 p.m.  She has been passing frank blood as well as some darker clots through most of the night, although it has slowed up today.  She does have a history of a previous GI bleed approximately 1 year ago and was told she had diverticulosis at that time.  She is chronically anticoagulated on Eliquis for atrial fibrillation, but she stopped taking this several months ago because she and her cardiologist discussed the options when she had the previous bleed, did not feel this was likely her best choice.  She denies any significant associated symptoms.  She has no nausea or vomiting.  She denies any fever or chills.  She denies any abdominal pain.  She has been passing blood, but was not having constipation or diarrhea prior to the bleed.  She has had no unintentional weight loss or gain and has been eating normally.  Since admission, she was found to have anemia and continued bleeding and is actually getting a blood transfusion currently.  She still is basically not complaining of any abdominal pain or other problems.  The patient also complains of bilateral lower extremity swelling.  She reports this having been getting worse over the past several weeks to months.  She discussed this with her primary care physician and was planning to see her cardiologist to discuss this as well.  She reports no severe pain, just some  aching and heaviness of the legs with the swelling.  PAST MEDICAL HISTORY:  Significant for: 1. History of diverticulosis. 2. History of colonic polyps. 3. Chronic atrial fibrillation with a CHADS2 score of 5. and this is     rate controlled with beta blocker. 4. Diabetes mellitus. 5. Hyperlipidemia. 6. Hypertension. 7. Atrial fibrillation. 8. Anemia.  PAST SURGICAL HISTORY:  None listed.  FAMILY HISTORY:  Positive for COPD and cardiac in the father and hypertension in the mother.  SOCIAL HISTORY:  No alcohol, tobacco, or illicit drug use.  She has never smoked or used smokeless tobacco.  She lives at home.  ALLERGIES:  DILTIAZEM CAUSING SHORTNESS OF BREATH AND SWELLING.  MEDICATIONS:  At home include: 1. Albuterol inhaler every 6 hours as needed. 2. Cardura 4 mg daily. 3. Ferrous sulfate 325 daily. 4. Fluticasone 50 mcg sprays b.i.d. 5. Lasix 40 mg daily. 6. Lopressor 100 mg b.i.d. 7. Omeprazole 20 mg daily. 8. Potassium chloride daily. 9. Simvastatin 20 mg daily.  REVIEW OF SYSTEMS:  GENERAL:  No fevers or chills.  No intentional weight loss or gain.  EYES:  No blurry or  double vision.  EARS:  No tinnitus or ear pain.  RESPIRATORY:  No shortness of breath, cough, or wheeze.  CARDIAC:  No chest pain, palpitations.  Does have a history of atrial fibrillation but well rate controlled.  GI:  Positive for GI bleed as above.  No nausea, vomiting, or abdominal pain.  GU:  No dysuria or hematuria.  ENDOCRINE:  No heat or cold intolerance.  HEME: Positive for GI bleeding and anemia.  No easy bruising.  SKIN:  No new rash, ulcers, or lesions.  NEURO:  No stroke, TIA, or seizure symptoms. PSYCH:  No anxiety, depression, or suicidal ideation.  MUSCULOSKELETAL: Positive for lower extremity swelling and occasional joint pain.  PHYSICAL EXAMINATION:  GENERAL:  This is an elderly white female seen while in nuclear medicine getting her bleeding scan, not in obvious distress.  She is  somewhat pale, but is awake, alert, communicative. VITAL SIGNS:  Pulse 83, blood pressure is 106/55, saturation is 99% on room air, respirations are 17, temperature is 97.7. HEENT:  Normocephalic and atraumatic.  Eyes:  Sclerae nonicteric. Conjunctivae clear.  Ears:  Normal external appearance.  Hearing is intact. NECK:  Supple without adenopathy or JVD. HEART:  Irregularly irregular without murmur. LUNGS:  Clear.  Slightly decreased bilaterally, but no increased respiratory effort or use of accessory muscles. ABDOMEN:  Soft, nondistended, nontender. EXTREMITIES:  Warm and well perfused.  She has 2+ lower extremity edema bilaterally with mild stasis dermatitis changes.  Her pedal pulses are 1+ bilaterally. NEURO:  Normal strength and tone in all 4 extremities. PSYCH:  Normal affect and mood. SKIN:  Warm and dry with good turgor.  LABORATORY EVALUATIONS:  Reveal a sodium of 140, potassium is 3.0, chloride is 108, CO2 is 27, BUN is 13, creatinine 0.83.  Glucose is 93. White blood cell count was 5.4.  First hemoglobin this morning was 7.4 then dropped to 6.7.  Platelet count was 192,000.  ASSESSMENT AND PLAN: 1. Lower gastrointestinal bleed likely secondary to diverticulosis. 2. Atrial fibrillation.  Previously on anticoagulation although not     currently taking. 3. Chronic diastolic congestive heart failure which is stable.  Beta     blocker being held due to blood pressure which was low on     admission.  Currently, her blood pressure is a little more stable     and she is receiving volume and blood transfusion. 4. Acute blood loss anemia.  Getting blood transfusion currently, and     trying to find the source of bleeding. 5. Essential hypertension.  PLAN:  The patient is currently undergoing a bleeding scan for her GI bleed.  This is an important test for localization as angiography and embolization are not an option without a localized source.  I have discussed with her the  pathophysiology and natural history of diverticular bleeds.  I have discussed the general good results with treatment with embolization if the source can be localized.  I have discussed the risk of embolization including small risk of ischemia or continued or recurrent bleeding and requirement for surgery, which would be an alternative to embolization.  If a bleeding source cannot be localized, embolization will be of no benefit and angiography would not be recommended.  It may then be considered colonoscopy or other test for further evaluation.  At this point, she should not be anticoagulated if at all possible due to her active GI bleed.  She is getting DVT prophylaxis with SCDs only, which is reasonable.  The  patient voices her understanding and we will follow along and see what her results of her bleeding scan are.  This is a level 4 consultation.          ______________________________ Annice Needy, MD     JSD/MEDQ  D:  03/13/2015  T:  03/14/2015  Job:  [id number]

## 2015-03-14 NOTE — Progress Notes (Signed)
Patient with another episode of bleeding this am, about 4 in the morning Hgb was down below 7 again, getting blood currently No pain Tachycardic, BP 113/64 currently Bleeding scan was negative  Acute blood loss anemia and lower GI bleed, likely diverticular. Source not identified If rebleeds at any point today or later, would go back to Nuc Med to recheck bleeding scan. Without source identified, no role for embolization

## 2015-03-14 NOTE — Progress Notes (Signed)
Pt with hx of 3 GI bleeds since had her gastric bypass surgery.  Rectal exam today shows black very heme positive stool. Patient hgb down and resistant to moving up despite some transfusions.  She likely is bleeding from anastamosis.  Plan EGD tomorrow, transfuse tonight and again in morning if needed.  She is awake and coherent.  Chest clear, heart mild tachycardia.

## 2015-03-15 ENCOUNTER — Encounter
Admission: EM | Disposition: A | Discharge status: Skilled Nursing Facility | Payer: Medicare Other | Source: Home / Self Care | Attending: Internal Medicine

## 2015-03-15 ENCOUNTER — Inpatient Hospital Stay: Payer: Medicare Other

## 2015-03-15 ENCOUNTER — Encounter
Admission: EM | Disposition: A | Discharge status: Skilled Nursing Facility | Payer: Self-pay | Source: Home / Self Care | Attending: Internal Medicine

## 2015-03-15 HISTORY — PX: PERIPHERAL VASCULAR CATHETERIZATION: SHX172C

## 2015-03-15 HISTORY — PX: EMBOLIZATION: SHX5507

## 2015-03-15 LAB — HEMOGLOBIN AND HEMATOCRIT, BLOOD
HEMATOCRIT: 22.5 % — AB (ref 35.0–47.0)
HEMATOCRIT: 17.8 % — AB (ref 35.0–47.0)
Hemoglobin: 7.3 g/dL — ABNORMAL LOW (ref 12.0–16.0)
Hemoglobin: 6 g/dL — ABNORMAL LOW (ref 12.0–16.0)

## 2015-03-15 LAB — GLUCOSE, CAPILLARY: Glucose-Capillary: 107 mg/dL — ABNORMAL HIGH (ref 70–99)

## 2015-03-15 LAB — PREPARE RBC (CROSSMATCH)

## 2015-03-15 SURGERY — EGD (ESOPHAGOGASTRODUODENOSCOPY)
Anesthesia: Monitor Anesthesia Care

## 2015-03-15 SURGERY — VISCERAL ANGIOGRAPHY
Anesthesia: Moderate Sedation

## 2015-03-15 MED ORDER — CEFAZOLIN SODIUM 1-5 GM-% IV SOLN
INTRAVENOUS | Status: AC
  Filled 2015-03-15: qty 50

## 2015-03-15 MED ORDER — HEPARIN (PORCINE) IN NACL 2-0.9 UNIT/ML-% IJ SOLN
INTRAMUSCULAR | Status: AC
  Filled 2015-03-15: qty 1000

## 2015-03-15 MED ORDER — HYDROCODONE-ACETAMINOPHEN 5-325 MG PO TABS
1.0000 | ORAL_TABLET | Freq: Four times a day (QID) | ORAL | Status: DC | PRN
  Administered 2015-03-15 – 2015-03-16 (×2): 1 via ORAL
  Filled 2015-03-15 (×2): qty 1

## 2015-03-15 MED ORDER — FENTANYL CITRATE (PF) 100 MCG/2ML IJ SOLN
25.0000 ug | Freq: Once | INTRAMUSCULAR | Status: AC
Start: 2015-03-15 — End: 2015-03-15
  Administered 2015-03-15: 25 ug via INTRAVENOUS

## 2015-03-15 MED ORDER — MIDAZOLAM HCL 5 MG/ML IJ SOLN
1.0000 mg | Freq: Once | INTRAMUSCULAR | Status: AC
  Administered 2015-03-15: 1 mg via INTRAVENOUS

## 2015-03-15 MED ORDER — ACETAMINOPHEN 325 MG PO TABS
650.0000 mg | ORAL_TABLET | Freq: Once | ORAL | Status: AC
  Administered 2015-03-15: 650 mg via ORAL
  Filled 2015-03-15: qty 2

## 2015-03-15 MED ORDER — FENTANYL CITRATE (PF) 100 MCG/2ML IJ SOLN: 25.0000 ug | Freq: Once | INTRAMUSCULAR | Status: DC

## 2015-03-15 MED ORDER — SODIUM CHLORIDE 0.9 % IV SOLN
Freq: Once | INTRAVENOUS | Status: AC
  Administered 2015-03-15: 23:00:00 via INTRAVENOUS

## 2015-03-15 MED ORDER — MIDAZOLAM HCL 5 MG/5ML IJ SOLN
INTRAMUSCULAR | Status: AC
  Filled 2015-03-15: qty 10

## 2015-03-15 MED ORDER — FENTANYL CITRATE (PF) 100 MCG/2ML IJ SOLN
INTRAMUSCULAR | Status: AC
  Filled 2015-03-15: qty 4

## 2015-03-15 MED ORDER — SODIUM CHLORIDE 0.9 % IV SOLN
Freq: Once | INTRAVENOUS | Status: AC
Start: 2015-03-15 — End: 2015-03-15
  Administered 2015-03-15: 11:00:00 via INTRAVENOUS

## 2015-03-15 MED ORDER — POTASSIUM CHLORIDE IN NACL 20-0.9 MEQ/L-% IV SOLN
INTRAVENOUS | Status: DC
  Administered 2015-03-15: 22:00:00 via INTRAVENOUS
  Filled 2015-03-15 (×5): qty 1000

## 2015-03-15 MED ORDER — IOHEXOL 350 MG/ML SOLN
INTRAVENOUS | Status: DC | PRN
  Administered 2015-03-15: 75 mL via INTRA_ARTERIAL

## 2015-03-15 MED ORDER — DIPHENHYDRAMINE HCL 25 MG PO CAPS
25.0000 mg | ORAL_CAPSULE | Freq: Once | ORAL | Status: AC
  Administered 2015-03-15: 25 mg via ORAL
  Filled 2015-03-15: qty 1

## 2015-03-15 MED ORDER — METOPROLOL TARTRATE 1 MG/ML IV SOLN
5.0000 mg | INTRAVENOUS | Status: DC | PRN
  Administered 2015-03-16: 5 mg via INTRAVENOUS
  Filled 2015-03-15: qty 5

## 2015-03-15 MED ORDER — LIDOCAINE-EPINEPHRINE (PF) 1 %-1:200000 IJ SOLN
INTRAMUSCULAR | Status: AC
  Filled 2015-03-15: qty 30

## 2015-03-15 SURGICAL SUPPLY — 12 items
BLOCK BEAD 500-700 (Vascular Products) ×9 IMPLANT
CATH C2 4F .038X65 (CATHETERS) ×3 IMPLANT
CATH MICROCATH PRGRT 130CM (CATHETERS) ×1 IMPLANT
CATH ROYAL FLUSH PIG 5F 70CM (CATHETERS) ×3 IMPLANT
DEVICE STARCLOSE SE CLOSURE (Vascular Products) ×3 IMPLANT
GLIDEWIRE ADV .035X180CM (WIRE) ×3 IMPLANT
MICROCATH PROGREAT 130CM (CATHETERS) ×3
PACK ANGIOGRAPHY (CUSTOM PROCEDURE TRAY) ×3 IMPLANT
SHEATH BRITE TIP 5FRX11 (SHEATH) ×3 IMPLANT
SYR MEDRAD MARK V 150ML (SYRINGE) ×3 IMPLANT
TUBING CONTRAST HIGH PRESS 72 (TUBING) ×3 IMPLANT
WIRE G 030X145 068400025406 (WIRE) ×3 IMPLANT

## 2015-03-15 NOTE — Plan of Care (Signed)
Problem: Phase I Progression Outcomes Goal: Voiding-avoid urinary catheter unless indicated Outcome: Not Met (add Reason) Pt has foley catheter in place.  Goal: Hemodynamically stable Outcome: Not Progressing Remains tachycardic with active GI bleed.   Problem: Phase II Progression Outcomes Goal: No active bleeding Outcome: Not Progressing Pt has active GI bleed per bleeding scan..   Comments:  Pt went to nuclear med emergently following another episode of rectal bleeding. Scan showed active bleed in small intestine per Radiologist. Contacted Dr Joneen Caraway with GI who recommended that the Prime doc call vascular to review pt's scan. Remains in AFib and tachycardic. No mental status changes. No other episodes of rectal bleeding noted.

## 2015-03-15 NOTE — Progress Notes (Signed)
Post procedure via right groin by dr dew, starclose done by md, sheath removed manual pressure held and transparent dressing applied by kristi. Total meds given 50 mcg ivp and versed 2 mg ivp. Pt arousable. Staff transferred pt to stretcher.

## 2015-03-15 NOTE — Progress Notes (Addendum)
Paged Dr. Markham JordanElliot due to pt having 6 frank blood stools with large clots. States to give remaining unit of blood and repeat cbc 1 hour post blood.  Chantal Worthey, RN  Upon further review, the continuous IV of protonix was not discontinued. Will restart gtt. [name],Julie Dickenson, RN

## 2015-03-15 NOTE — Op Note (Signed)
Pre op Dx - GI Bleed Post op Dx - GI bleed Procedure -mesenteric angiogram and microbead embolization to right colic and ileocecal branches of the SMA Surgeon - Dew Anesthesia - local/MCS EBL minimal Complications - none Findings none

## 2015-03-15 NOTE — Progress Notes (Signed)
Received call from Dr. Markham JordanElliot. Stated protonix gtt to remain off. He stated it was good that 2 more units be transfused today. Stated he thought Protonix should be given PO Qday approx. 40mg .  Derald MacleodKillingsworth,Oceanna Arruda, RN

## 2015-03-15 NOTE — Progress Notes (Signed)
Update:  Called by nursing with positive bleeding scan result.  Patient hemodynamically stable at this time, but has required multiple PRBC transfusions over course of her admission.  Still having bloody stools.  GI contacted and recommended contact Vascular, both groups consulting on this patient.  Contacted Vascular, Dr Gilda CreaseSchnier, and informed of result.  Stated would review.    - Continue frequent Hgb monitoring.  Transfuse as necessary.   Kristeen MissWILLIS, Aime Carreras FIELDING [facility] 03/15/2015, 1:25 AM

## 2015-03-15 NOTE — Op Note (Signed)
NAMCarmon [name]:  Banke, Eldine                  ACCOUNT NO.:  [id number]  MEDICAL RECORD NO.:  [id number]  LOCATION:  IC13A                        FACILITY:  [facility]  PHYSICIAN:  Annice NeedyJason S Rodnesha Elie, MD        DATE OF BIRTH:  [birth year]  DATE OF PROCEDURE:  03/15/2015 DATE OF DISCHARGE:                              OPERATIVE REPORT   PREOPERATIVE DIAGNOSIS: 1. Brisk gastrointestinal bleeding with positive bleeding scan. 2. Atrial fibrillation. 3. Hypertension. 4. Acute blood loss anemia.  POSTOPERATIVE DIAGNOSIS: 1. Brisk gastrointestinal bleeding with positive bleeding scan. 2. Atrial fibrillation. 3. Hypertension. 4. Acute blood loss anemia.  PROCEDURE PERFORMED: 1. Catheter placement to right ileocolic and cecal branch of superior     mesenteric artery. 2. Ultrasound guidance of vascular access of right femoral artery. 3. Aortogram and selective SMA angiogram with selective imaging of the     ileocolic branch and the cecal branch. 4. Microbead embolization of the ileocolic and cecal branches with a     total of 1.75 mm of 500-700 micron polyvinyl alcohol beads. 5. StarClose closure device right femoral artery.  SURGEON:  Annice NeedyJason S Elyon Zoll, MD  ANESTHESIA:  Local with moderate conscious sedation.  BLOOD LOSS:  Minimal.  INDICATIONS FOR PROCEDURE:  This is a 80 year old female who was admitted to the hospital a couple of days ago with brisk lower GI bleeding.  Her initial bleeding scan was negative; however, she had a recurrent episode of bleeding and a new bleeding scan was performed. This showed bleeding in the right lower quadrant going across the abdomen to the left.  The official report was of a possible small bowel bleed, but this clearly looked like a bleed in the proximal portion of the colon and cecum with blood traversing across a somewhat redundant transverse colon. This also matched her clinical scenario of bright red blood per rectum.  She is brought in for angiography for  further evaluation and potential treatment.  The risks and benefits were discussed.  Informed consent is obtained.  DESCRIPTION OF PROCEDURE:  The patient is brought to the operating suite.  Groins are shaved and prepped, and a sterile surgical field is created.  The right femoral head was localized with fluoroscopy. Ultrasound was used to visualize the patent right femoral artery.  This was done under direct ultrasound guidance without difficulty with a Seldinger needle.  Due to poor anatomic landmarks, a patent right femoral artery was seen and the permanent images were recorded with access.  A J-wire and 5-French sheath were placed.  A pigtail catheter was placed near the T12-L1 level. This showed what appeared to be almost a common origin of the right renal artery and superior mesenteric artery.  They were very close in their orientation. The left renal artery was separate.  I selectively cannulated the SMA without difficult with a C2 catheter and initial imaging was performed. This demonstrated a somewhat left-going SMA with the branches going to the cecum and terminal ileum and ileocolic branch off a secondary SMA branch.  These branches were then 3rd and 4th order bifurcations off the aorta.  I advanced the C2 into the primary SMA branches and down  toward the branch leading rightward.  I then used a prograde microcatheter and navigated beyond the next two bifurcations into an artery that first fed off the cecal branch at a right angle and then had a more down-going ileocolic branch.  There was not an obvious blush on initial imaging; however, when we advanced the prograde microcatheter out the ileocolic branch and then later the cecal branch, there was a brisk uptake of blood in the colon, particularly in the cecum.  Both these catheterizations were then performed to evaluate the lay of the land.  I then decided to perform treatment.  The catheter at this point was advanced out  the cecal branch, which was a 3rd or 4th order branch of the SMA.  It was advanced well out into the region supplying only the cecum.  At this level, imaging was performed and it appear to have a blush, approximately 1.25 cc of 500-700 polyvinyl alcohol beads were instilled in this location with marked reduction in flow after instillation, although the primary arteries were still patent.  I then pulled the catheter back, advanced downward into the ileocolic branch.  From here, an additional 0.5 cc of 500-700 micron polyvinyl alcohol beads were instilled into the area with again lessening of the flow to this area.  I pulled back to the main C2 and did an image to ensure that no other branches were feeding the right colon and none were.  At this point, I elected to terminate the procedure.  The sheath was removed.  StarClose closure device was deployed in the usual fashion with excellent hemostatic result.  The patient tolerated the procedure well and was taken to recovery room in stable condition.          ______________________________ Annice NeedyJason S Dale Ribeiro, MD     JSD/MEDQ  D:  03/15/2015  T:  03/15/2015  Job:  [id number]

## 2015-03-15 NOTE — Progress Notes (Signed)
Bleeding scan reviewed.  Official report suggests small bowel but clinically this appears to be a lower GI bleed and my review of the scan would suggest that this is clearly cecal with a floppy transverse colon and blood going to the left colon. Will plan GI embolization of the ileocecal/right colon to try to stop bleeding.

## 2015-03-15 NOTE — Progress Notes (Signed)
Pt had embolization of right  Colon/ileal vessels by Dr. Wyn Quakerew.  Getting another unit of blood.  Pulse up but BP holding ok.  If drops below 8 after this unit  would transfuse. No vomiting and no pain, central line passed by Dr. Belia HemanKasa. Chest clear, heart 1/6 systolic murmur, abd with bowel sounds present.  No other suggestions.

## 2015-03-15 NOTE — Procedures (Signed)
  Central Venous Catheter Placement: Indication: Patient receiving vesicant or irritant drug.; Patient receiving intravenous therapy for longer than 5 days.; Patient has limited or no vascular access.   Consent:verbal  Risks and benefits explained in detail including risk of infection, bleeding, respiratory failure and death..   Hand washing performed prior to starting the procedure.   Procedure: An active timeout was performed and correct patient, name, & ID confirmed.  After explaining risk and benefits, patient was positioned correctly for central venous access. Patient was prepped using strict sterile technique including chlorohexadine preps, sterile drape, sterile gown and sterile gloves.  The area was prepped, draped and anesthetized in the usual sterile manner. Patient comfort was obtained.  A triple lumen catheter was placed in LEFT  Internal Jugularr Vein There was good blood return, catheter caps were placed on lumens, catheter flushed easily, the line was secured and a sterile dressing applied.   Ultrasound Sonosite was used to visualize vasculature and guidance of needle.   Number of Attempts: 1 Complications:1  Estimated Blood Loss: 1 cc  Chest Radiograph indicated and ordered.   Operator: Charika Mikelson.   Lucie LeatherKurian David Harlis Champoux, M.D. Pulmonary & Critical Care Medicine [facility]  Medical Director Intensive Care Unit

## 2015-03-15 NOTE — Progress Notes (Deleted)
Upon transfer

## 2015-03-15 NOTE — Progress Notes (Signed)
Paged Dr. Bluford Kaufmannh waiting for callback in regards to discontinued Protonix gtt. Derald MacleodKillingsworth,Chene Kasinger, RN

## 2015-03-15 NOTE — Progress Notes (Signed)
Patient ID: Julie AmmonsJoanne S [name], female   DOB: 1935-02-09, 80 y.o.   MRN: [id number] [facility] [place] [facility]   PATIENT NAME: [name]    MR#:  [id number]  DATE OF BIRTH:  1935-02-09  SUBJECTIVE:  CHIEF COMPLAINT:   Chief Complaint  Patient presents with  . Rectal Bleeding    pt reports dark red blood and blood clots in stool    who presents to the ED tonight after developing rectal bleeding. She states of the first bloody stool happened around 7 PM on the evening of admission. She states that she is passing frank blood along with some darker clots. Continued to have bleeding and developed a. Fib , RVR,  hypotension , transfused.  Feels satisfactrory today though. NM test y-day positive for bleeding in RLQ , HGb 7.3 again today before transfusion, remains tachycardic, but not hypotensive today, feels good REVIEW OF SYSTEMS:   CONSTITUTIONAL: No fever, fatigue or weakness.  EYES: No blurred or double vision.  EARS, NOSE, AND THROAT: No tinnitus or ear pain.  RESPIRATORY: No cough, shortness of breath, wheezing or hemoptysis.  CARDIOVASCULAR: No chest pain, orthopnea, edema.  GASTROINTESTINAL: No nausea, vomiting, diarrhea or abdominal pain.  GENITOURINARY: No dysuria, hematuria.  ENDOCRINE: No polyuria, nocturia,  HEMATOLOGY: No anemia, easy bruising or bleeding SKIN: No rash or lesion. MUSCULOSKELETAL: No joint pain or arthritis.   NEUROLOGIC: No tingling, numbness, weakness.  PSYCHIATRY: No anxiety or depression.   VITAL SIGNS: Blood pressure 125/51, pulse 126, temperature 98.9 F (37.2 C), temperature source Axillary, resp. rate 18, height 5\' 4"  (1.626 m), weight 100.3 kg (221 lb 1.9 oz), SpO2 100 %.  PHYSICAL EXAMINATION:   GENERAL:  80 y.o.-year-old patient lying in the bed with no acute distress. Pink cheeks .   EYES: Pupils equal, round, reactive to light and accommodation. No scleral icterus. Extraocular muscles intact.  HEENT: Head  atraumatic, normocephalic. Oropharynx and nasopharynx clear.  NECK:  Supple, no jugular venous distention. No thyroid enlargement, no tenderness.  LUNGS: Normal breath sounds bilaterally, no wheezing, rales,rhonchi or crepitation. No use of accessory muscles of respiration.  CARDIOVASCULAR: S1, S2 normal. No murmurs, rubs, or gallops.  ABDOMEN: Soft, nontender, nondistended. Bowel sounds present. No organomegaly or mass. Minimal discomfort diffusely on abdominal palpation.   EXTREMITIES: No pedal edema, cyanosis, or clubbing.  NEUROLOGIC: Cranial nerves II through XII are intact. Muscle strength 5/5 in all extremities. Sensation intact. Gait not checked.  PSYCHIATRIC: The patient is alert and oriented x 3.  SKIN: No obvious rash, lesion, or ulcer.   ORDERS/RESULTS REVIEWED:   CBC  Recent Labs Lab 03/12/15 2128  03/13/15 0647 03/13/15 0856 03/13/15 2200 03/14/15 0315 03/14/15 1528 03/15/15 0549  WBC 5.1  --  5.4  --   --  9.7  --   --   HGB 8.5*  < > 7.4* 6.7* 8.0* 6.9* 7.6* 7.3*  HCT 26.1*  --  22.5*  --   --  20.9* 23.8* 22.5*  PLT 248  --  192  --   --  194  --   --   MCV 97.7  --  97.0  --   --  92.4  --   --   MCH 31.9  --  31.7  --   --  30.4  --   --   MCHC 32.6  --  32.7  --   --  32.9  --   --   RDW 17.6*  --  17.3*  --   --  19.3*  --   --   LYMPHSABS 0.9*  --   --   --   --  2.0  --   --   MONOABS 0.5  --   --   --   --  0.9  --   --   EOSABS 0.2  --   --   --   --  0.1  --   --   BASOSABS 0.1  --   --   --   --  0.1  --   --   < > = values in this interval not displayed. ------------------------------------------------------------------------------------------------------------------  Chemistries   Recent Labs Lab 03/12/15 2128 03/13/15 0647 03/14/15 0315  NA 140 140 145  K 2.9* 3.0* 3.7  CL 105 108 118*  CO2 GLUCOSE 168* 93 125*  BUN CREATININE 0.98 0.83 0.83  CALCIUM 8.0* 7.6* 7.4*  MG  --  1.8  --   AST 19  --   --   ALT 7*  --    --   ALKPHOS 78  --   --   BILITOT 1.0  --   --    ------------------------------------------------------------------------------------------------------------------ estimated creatinine clearance is 64.3 mL/min (by C-G formula based on Cr of 0.83). ------------------------------------------------------------------------------------------------------------------ No results for input(s): TSH, T4TOTAL, T3FREE, THYROIDAB in the last 72 hours.  Invalid input(s): FREET3  Cardiac Enzymes  Recent Labs Lab 03/12/15 2128  TROPONINI <0.03   ------------------------------------------------------------------------------------------------------------------ Invalid input(s): POCBNP ---------------------------------------------------------------------------------------------------------------  RADIOLOGY: Nm Gi Blood Loss  03/15/2015   CLINICAL DATA:  Gastrointestinal bleeding. Patient status post gastric bypass surgery.  EXAM: NUCLEAR MEDICINE GASTROINTESTINAL BLEEDING SCAN  TECHNIQUE: Sequential abdominal images were obtained following intravenous administration of Tc-87m labeled red blood cells.  RADIOPHARMACEUTICALS:  22.7 mCi mCi Tc-41m in-vitro labeled red cells.  COMPARISON:  GI bleeding scan 03/13/15.  FINDINGS: During second hour of exam, there is endoluminal accumulation of tagged red blood cells which begins in the right lower quadrant and moves in a serpiginous pattern into the left abdomen. This pattern is most suggestive of active bleeding into the small intestine. Bleeding initiating from the cecum could have a similar pattern but the transition from right to left has a very tortuous path suggestive of small bowel.  IMPRESSION: Active gastrointestinal bleeding which initiates in the right lower quadrant. Favor small bowel as site of bleeding initiation over less likely cecum.  Findings conveyed toICU nurse Beth on 03/15/2015  at00:39.   Electronically Signed   By: Genevive Bi M.D.   On:  03/15/2015 00:44   Nm Gi Blood Loss  03/13/2015   CLINICAL DATA:  Bloody stools. History of diverticulitis. Blood transfusion.  EXAM: NUCLEAR MEDICINE GASTROINTESTINAL BLEEDING SCAN  TECHNIQUE: Sequential abdominal images were obtained following intravenous administration of Tc-30m labeled red blood cells.  RADIOPHARMACEUTICALS:  23.1 mCi Tc-74m in-vitro labeled red cells.  COMPARISON:  None.  FINDINGS: Imaging was performed over 2 hours.  Blood pool activity noted. On later images there is some bladder activity likely related to free technetium. However, no definite bowel activity is appreciated.  IMPRESSION: 1. No active bleeding during the course of the exam.   Electronically Signed   By: Gaylyn Rong M.D.   On: 03/13/2015 16:33    EKG:  Orders placed or performed during the hospital encounter of 03/12/15  . ED EKG  . ED EKG  . EKG 12-Lead  . EKG 12-Lead  ASSESSMENT AND PLAN:    1. Acute posthemorrhagic anemia, transfuse 2 more PRBC units today, following HGb later today, d/w DR Schnier, for embolization today , following Hgb later today.   2. GI bleed, suspected diverticular, appreciate GI and  vascular inputs, tagged  RBC scan y-day showed RLQ active bleeding, suspected small bowel rather than cecum , continue PPI  3 Hypotension, due to blood loss, resolved with  transfusion, continue IVf, transfuse 2 u PRBC today again, following with resumption of metoprolol, stable clinically.   4. Atrial fibrillation, RVR, not on  Eliquis, tachycardic due to volume loss, transfusing, continue IVF, resume metopolol, dc digoxin   5. H/o Diverticulosis with likely diverticular bleed , as above  6. Chronic diastolic CHF (congestive heart failure), stable, no exacerbation, watching  with transfusions, oxygenation is good on RA  DRUG ALLERGIES:  Allergies  Allergen Reactions  . Diltiazem Hcl     REACTION: SOB/swelling    CODE STATUS:     Code Status Orders        Start      Ordered   03/13/15 0119  Full code   Continuous     03/13/15 0118         Kato Wieczorek M.D on 03/15/2015 at 10:09 AM  Between 7am to 6pm - Pager - [phone]  After 6pm go to [email or web address] - password [email or web address] [facility]  [facility]  Office  [phone]    Critical Time Spent in minutes   40 min D/w DR Fredia BeetsSchnier  Artyom Stencel M.D on 03/15/2015 at 10:09 AM  [facility] Office  [phone]

## 2015-03-16 LAB — TYPE AND SCREEN
ABO/RH(D): A POS
ANTIBODY SCREEN: NEGATIVE
UNIT DIVISION: 0
UNIT DIVISION: 0
UNIT DIVISION: 0
Unit division: 0
Unit division: 0
Unit division: 0
Unit division: 0
Unit division: 0
Unit division: 0

## 2015-03-16 LAB — BASIC METABOLIC PANEL
ANION GAP: 0 — AB (ref 5–15)
BUN: 15 mg/dL (ref 6–20)
CO2: 23 mmol/L (ref 22–32)
Calcium: 6.8 mg/dL — ABNORMAL LOW (ref 8.9–10.3)
Chloride: 119 mmol/L — ABNORMAL HIGH (ref 101–111)
Creatinine, Ser: 0.84 mg/dL (ref 0.44–1.00)
Glucose, Bld: 124 mg/dL — ABNORMAL HIGH (ref 65–99)
Potassium: 3.9 mmol/L (ref 3.5–5.1)
SODIUM: 140 mmol/L (ref 135–145)

## 2015-03-16 LAB — PREPARE RBC (CROSSMATCH)

## 2015-03-16 LAB — GLUCOSE, CAPILLARY: GLUCOSE-CAPILLARY: 132 mg/dL — AB (ref 70–99)

## 2015-03-16 LAB — CBC
HEMATOCRIT: 20.5 % — AB (ref 35.0–47.0)
Hemoglobin: 6.9 g/dL — ABNORMAL LOW (ref 12.0–16.0)
MCH: 29.6 pg (ref 26.0–34.0)
MCHC: 33.4 g/dL (ref 32.0–36.0)
MCV: 88.6 fL (ref 80.0–100.0)
Platelets: 181 10*3/uL (ref 150–440)
RBC: 2.31 MIL/uL — ABNORMAL LOW (ref 3.80–5.20)
RDW: 16.7 % — ABNORMAL HIGH (ref 11.5–14.5)
WBC: 16.9 10*3/uL — ABNORMAL HIGH (ref 3.6–11.0)

## 2015-03-16 MED ORDER — POTASSIUM CHLORIDE CRYS ER 20 MEQ PO TBCR
20.0000 meq | EXTENDED_RELEASE_TABLET | Freq: Every day | ORAL | Status: DC
  Administered 2015-03-16 – 2015-03-20 (×5): 20 meq via ORAL
  Filled 2015-03-16 (×5): qty 1

## 2015-03-16 MED ORDER — FUROSEMIDE 10 MG/ML IJ SOLN
20.0000 mg | Freq: Once | INTRAMUSCULAR | Status: AC
  Administered 2015-03-16: 20 mg via INTRAVENOUS
  Filled 2015-03-16: qty 2

## 2015-03-16 MED ORDER — DIGOXIN 0.25 MG/ML IJ SOLN
0.1250 mg | Freq: Every day | INTRAMUSCULAR | Status: DC
  Administered 2015-03-17 – 2015-03-20 (×4): 0.125 mg via INTRAVENOUS
  Filled 2015-03-16 (×4): qty 2

## 2015-03-16 MED ORDER — DIGOXIN 0.25 MG/ML IJ SOLN
0.2500 mg | Freq: Four times a day (QID) | INTRAMUSCULAR | Status: AC
  Administered 2015-03-16: 0.5 mg via INTRAVENOUS
  Administered 2015-03-16: 0.25 mg via INTRAVENOUS
  Filled 2015-03-16 (×2): qty 2

## 2015-03-16 MED ORDER — SODIUM CHLORIDE 0.9 % IV SOLN
8.0000 mg/h | INTRAVENOUS | Status: AC
  Administered 2015-03-16 – 2015-03-19 (×7): 8 mg/h via INTRAVENOUS
  Filled 2015-03-16 (×8): qty 80

## 2015-03-16 MED ORDER — FUROSEMIDE 20 MG PO TABS
20.0000 mg | ORAL_TABLET | Freq: Every day | ORAL | Status: DC
  Administered 2015-03-16 – 2015-03-20 (×6): 20 mg via ORAL
  Filled 2015-03-16 (×5): qty 1

## 2015-03-16 MED ORDER — SODIUM CHLORIDE 0.9 % IV SOLN
Freq: Once | INTRAVENOUS | Status: AC
  Administered 2015-03-16: 19:00:00 via INTRAVENOUS

## 2015-03-16 MED ORDER — SODIUM CHLORIDE 0.9 % IV SOLN
Freq: Once | INTRAVENOUS | Status: AC
  Administered 2015-03-16: 14:00:00 via INTRAVENOUS

## 2015-03-16 NOTE — Progress Notes (Signed)
Pt is alert and oriented. C/o pain in rt groin. PRn pain meds given as ordered. One unit of PRBC given this shift. Foley in place. UOP . Belly soft.  Had two BM's durning night. IVf is in progress . Resting in bed without any distress.

## 2015-03-16 NOTE — Plan of Care (Signed)
Problem: Phase I Progression Outcomes Goal: Voiding-avoid urinary catheter unless indicated Outcome: Not Progressing Foley remains in placed due to pt level of care.  Goal: Hemodynamically stable Outcome: Not Progressing Pt HR in bet 120-150's  Problem: Phase I Progression Outcomes Goal: Ventricular heart rate < 120/min Outcome: Not Progressing HR is in 120'-150's PRN lopressor given. Goal: Anticoagulation Therapy per MD order Outcome: Not Applicable Date Met:  56/81/27 Current GI bleed.

## 2015-03-16 NOTE — Plan of Care (Signed)
Problem: Phase I Progression Outcomes Goal: Pain controlled with appropriate interventions Outcome: Progressing Pt was c/o pain to rt groin.MD paged PRN  Pain meds given.

## 2015-03-16 NOTE — Plan of Care (Signed)
Problem: Phase I Progression Outcomes Goal: OOB as tolerated unless otherwise ordered Outcome: Not Progressing Pt remains bed rest due to GI bleed.

## 2015-03-16 NOTE — Progress Notes (Signed)
[facility]  Daily Progress Note   Subjective  - 1 Day Post-Op  Having lots of "gas pain", but otherwise doing well Had some blood yesterday evening, but very little overnight and none today No pain or problems with access site HR up, BP stable  Objective Filed Vitals:   03/16/15 0915 03/16/15 0930 03/16/15 0945 03/16/15 1000  BP:  121/84  97/44  Pulse: 137 135 127 134  Temp:      TempSrc:      Resp: 19 29 20 18   Height:      Weight:      SpO2: 99% 97% 100% 100%    Intake/Output Summary (Last 24 hours) at 03/16/15 1116 Last data filed at 03/16/15 0900  Gross per 24 hour  Intake 2488.42 ml  Output   1475 ml  Net 1013.42 ml    PULM  CTAB CV  RRR VASC  Access site C/D/I Abd  Soft, ND, mildly tender  Laboratory CBC    Component Value Date/Time   WBC 16.9* 03/16/2015 0456   WBC 5.0 08/01/2014 1431   WBC 4.3 07/13/2014 1016   HGB 6.9* 03/16/2015 0456   HGB 11.1* 07/13/2014 1016   HCT 20.5* 03/16/2015 0456   HCT 34.5* 07/13/2014 1016   PLT 181 03/16/2015 0456   PLT 234 07/13/2014 1016    BMET    Component Value Date/Time   NA 145 03/14/2015 0315   NA 138 08/01/2014 1431   NA 143 02/21/2014 0011   K 3.7 03/14/2015 0315   K 4.3 02/21/2014 0011   CL 118* 03/14/2015 0315   CL 111* 02/21/2014 0011   CO2 24 03/14/2015 0315   CO2 25 02/21/2014 0011   GLUCOSE 125* 03/14/2015 0315   GLUCOSE 89 08/01/2014 1431   GLUCOSE 119* 02/21/2014 0011   BUN 15 03/14/2015 0315   BUN 16 08/01/2014 1431   BUN 22* 02/21/2014 0011   CREATININE 0.83 03/14/2015 0315   CREATININE 1.01 02/21/2014 0011   CALCIUM 7.4* 03/14/2015 0315   CALCIUM 8.2* 02/21/2014 0011   GFRNONAA >60 03/14/2015 0315   GFRNONAA 54* 02/21/2014 0011   GFRAA >60 03/14/2015 0315   GFRAA >60 02/21/2014 0011    Assessment/Planning: POD #1 s/p microbead embolization of the cecum/right colon    Bleeding seems to have stopped  Discomfort not surprising after embolization, but  no signs of severe ischemia  Probably OK to get clear liquids and advance over next 24-48 hours,  No other recs from vascular POV    Bertice Risse  03/16/2015, 11:16 AM

## 2015-03-16 NOTE — Progress Notes (Signed)
Patient ID: Vernie AmmonsJoanne S Winzer, female   DOB: 1935/02/22, 80 y.o.   MRN: [id number] [facility] [place] [facility]   PATIENT NAME: Tessie FassJoanne Christon    MR#:  [id number]  DATE OF BIRTH:  1935/02/22  SUBJECTIVE:  CHIEF COMPLAINT:   Chief Complaint  Patient presents with  . Rectal Bleeding    pt reports dark red blood and blood clots in stool    who presents to the ED tonight after developing rectal bleeding. She states of the first bloody stool happened around 7 PM on the evening of admission. She states that she is passing frank blood along with some darker clots. Continued to have bleeding and developed a. Fib , RVR,  hypotension , transfused 7 u PRBC so far, hypotensive again today after bleeding y-day, HGb 6.9, ordered 2 mor e U PRBC to transfuse today, swollen  REVIEW OF SYSTEMS:   CONSTITUTIONAL: No fever, fatigue or weakness.  EYES: No blurred or double vision.  EARS, NOSE, AND THROAT: No tinnitus or ear pain.  RESPIRATORY: No cough, shortness of breath, wheezing or hemoptysis.  CARDIOVASCULAR: No chest pain, orthopnea, edema.  GASTROINTESTINAL: No nausea, vomiting, diarrhea or abdominal pain. Some black stool,  little amount last night  GENITOURINARY: No dysuria, hematuria.  ENDOCRINE: No polyuria, nocturia,  HEMATOLOGY: No anemia, easy bruising or bleeding SKIN: No rash or lesion. MUSCULOSKELETAL: No joint pain or arthritis.   NEUROLOGIC: No tingling, numbness, weakness.  PSYCHIATRY: No anxiety or depression.   VITAL SIGNS: Blood pressure 126/47, pulse 116, temperature 99.1 F (37.3 C), temperature source Oral, resp. rate 16, height 5\' 4"  (1.626 m), weight 100.3 kg (221 lb 1.9 oz), SpO2 99 %.  PHYSICAL EXAMINATION:   GENERAL:  80 y.o.-year-old patient lying in the bed with no acute distress. Pink cheeks .   EYES: Pupils equal, round, reactive to light and accommodation. No scleral icterus. Extraocular muscles intact.  HEENT: Head atraumatic,  normocephalic. Oropharynx and nasopharynx clear.  NECK:  Supple, no jugular venous distention. No thyroid enlargement, no tenderness.  LUNGS: Normal breath sounds bilaterally, no wheezing, few rales,rhonchi and crepitations at bases b/l. No use of accessory muscles of respiration.  CARDIOVASCULAR: S1, S2 normal. No murmurs, rubs, or gallops.  ABDOMEN: Soft, nontender, nondistended. Bowel sounds present. No organomegaly or mass. Minimal discomfort diffusely on abdominal palpation.   EXTREMITIES: severe pedal, LE, UE  edema, no cyanosis, or clubbing.  NEUROLOGIC: Cranial nerves II through XII are intact. Muscle strength 5/5 in all extremities. Sensation intact. Gait not checked.  PSYCHIATRIC: The patient is alert and oriented x 3.  SKIN: No obvious rash, lesion, or ulcer. Diffuse anasarca.   ORDERS/RESULTS REVIEWED:   CBC  Recent Labs Lab 03/12/15 2128  03/13/15 [id number]  03/14/15 0315 03/14/15 1528 03/15/15 0549 03/15/15 2018 03/16/15 0456  WBC 5.1  --  5.4  --  9.7  --   --   --  16.9*  HGB 8.5*  < > 7.4*  < > 6.9* 7.6* 7.3* 6.0* 6.9*  HCT 26.1*  --  22.5*  --  20.9* 23.8* 22.5* 17.8* 20.5*  PLT 248  --  192  --  194  --   --   --  181  MCV 97.7  --  97.0  --  92.4  --   --   --  88.6  MCH 31.9  --  31.7  --  30.4  --   --   --  29.6  MCHC 32.6  --  32.7  --  32.9  --   --   --  33.4  RDW 17.6*  --  17.3*  --  19.3*  --   --   --  16.7*  LYMPHSABS 0.9*  --   --   --  2.0  --   --   --   --   MONOABS 0.5  --   --   --  0.9  --   --   --   --   EOSABS 0.2  --   --   --  0.1  --   --   --   --   BASOSABS 0.1  --   --   --  0.1  --   --   --   --   < > = values in this interval not displayed. ------------------------------------------------------------------------------------------------------------------  Chemistries   Recent Labs Lab 03/12/15 2128 03/13/15 0647 03/14/15 0315  NA 140 140 145  K 2.9* 3.0* 3.7  CL 105 108 118*  CO2 GLUCOSE 168* 93 125*  BUN CREATININE 0.98 0.83 0.83  CALCIUM 8.0* 7.6* 7.4*  MG  --  1.8  --   AST 19  --   --   ALT 7*  --   --   ALKPHOS 78  --   --   BILITOT 1.0  --   --    ------------------------------------------------------------------------------------------------------------------ estimated creatinine clearance is 64.3 mL/min (by C-G formula based on Cr of 0.83). ------------------------------------------------------------------------------------------------------------------ No results for input(s): TSH, T4TOTAL, T3FREE, THYROIDAB in the last 72 hours.  Invalid input(s): FREET3  Cardiac Enzymes  Recent Labs Lab 03/12/15 2128  TROPONINI <0.03   ------------------------------------------------------------------------------------------------------------------ Invalid input(s): POCBNP ---------------------------------------------------------------------------------------------------------------  RADIOLOGY: Nm Gi Blood Loss  03/15/2015   CLINICAL DATA:  Gastrointestinal bleeding. Patient status post gastric bypass surgery.  EXAM: NUCLEAR MEDICINE GASTROINTESTINAL BLEEDING SCAN  TECHNIQUE: Sequential abdominal images were obtained following intravenous administration of Tc-41m labeled red blood cells.  RADIOPHARMACEUTICALS:  22.7 mCi mCi Tc-11m in-vitro labeled red cells.  COMPARISON:  GI bleeding scan 03/13/15.  FINDINGS: During second hour of exam, there is endoluminal accumulation of tagged red blood cells which begins in the right lower quadrant and moves in a serpiginous pattern into the left abdomen. This pattern is most suggestive of active bleeding into the small intestine. Bleeding initiating from the cecum could have a similar pattern but the transition from right to left has a very tortuous path suggestive of small bowel.  IMPRESSION: Active gastrointestinal bleeding which initiates in the right lower quadrant. Favor small bowel as site of bleeding initiation over less likely cecum.  Findings  conveyed toICU nurse Beth on 03/15/2015  at00:39.   Electronically Signed   By: Genevive Bi M.D.   On: 03/15/2015 00:44   Dg Chest Port 1 View  03/15/2015   CLINICAL DATA:  Central line placement.  EXAM: PORTABLE CHEST - 1 VIEW  COMPARISON:  Single view of the chest 03/12/2015.  FINDINGS: The patient has a new left IJ approach central venous catheter with the tip projecting at the superior cavoatrial junction. No pneumothorax identified. Right pleural effusion and basilar airspace disease persists. The left lung appears clear. Heart size is upper normal.  IMPRESSION: Tip of left IJ catheter projects at the superior cavoatrial junction. Negative for pneumothorax.  No marked change in a small to moderate right pleural effusion and basilar airspace disease.   Electronically Signed   By: Maisie Fus  Dalessio M.D.   On: 03/15/2015 18:02    EKG:  Orders placed or performed during the hospital encounter of 03/12/15  . ED EKG  . ED EKG  . EKG 12-Lead  . EKG 12-Lead    ASSESSMENT AND PLAN:    1. Acute posthemorrhagic anemia, s/p 7 U PRBC transfusion, will transfuse  2 more PRBC units today, following HGb later today, , s/p embolization 5.4.16 by DR Gilda CreaseSchnier , following Hgb in am as well, NPO, GI consulted, continue PPI IV.   2. GI bleed, initially suspected diverticular, but bleeding scan 5.4.16 showed  RLQ area active bleed, suspected small bowel, appreciate GI and  vascular inputs, now after embolizatrion procedure 5.4.16 by DR Gilda CreaseSchnier as above,  continue PPI IV drip  3 Hypotension, due to blood loss, was better y-day after transfusions, transfuse 2 u PRBC today again, dc  metoprolol, restart digoxin again, following clinically.   4. Atrial fibrillation, RVR, dc  metopolol, restart digoxin   5. H/o Diverticulosis with ? diverticular bleed , as above  6. acute on chronic diastolic CHF (congestive heart failure), worsened   with transfusions, oxygenation is good on RA, needs lasix, restart it as  soon as BP is better   DRUG ALLERGIES:  Allergies  Allergen Reactions  . Diltiazem Hcl     REACTION: SOB/swelling    CODE STATUS:     Code Status Orders        Start     Ordered   03/13/15 0119  Full code   Continuous     03/13/15 0118         Ewa Hipp M.D on 03/16/2015 at 12:38 PM  Between 7am to 6pm - Pager - [phone]  After 6pm go to [email or web address] - password [email or web address] [email or web address]  [facility]  Office  [phone]    Critical Time Spent in minutes   50 min D/w DR Karl PockKasa   Jaklyn Alen M.D on 03/16/2015 at 12:38 PM  [facility]  [phone]

## 2015-03-17 LAB — POTASSIUM: POTASSIUM: 3.7 mmol/L (ref 3.5–5.1)

## 2015-03-17 LAB — HEMOGLOBIN AND HEMATOCRIT, BLOOD
HCT: 23.1 % — ABNORMAL LOW (ref 35.0–47.0)
Hemoglobin: 7.8 g/dL — ABNORMAL LOW (ref 12.0–16.0)

## 2015-03-17 LAB — PREPARE RBC (CROSSMATCH)

## 2015-03-17 LAB — HEMOGLOBIN: Hemoglobin: 8.8 g/dL — ABNORMAL LOW (ref 12.0–16.0)

## 2015-03-17 MED ORDER — BOOST / RESOURCE BREEZE PO LIQD
1.0000 | Freq: Two times a day (BID) | ORAL | Status: DC
  Administered 2015-03-17 – 2015-03-20 (×6): 1 via ORAL

## 2015-03-17 MED ORDER — POTASSIUM CHLORIDE CRYS ER 20 MEQ PO TBCR
20.0000 meq | EXTENDED_RELEASE_TABLET | Freq: Once | ORAL | Status: AC
  Administered 2015-03-17: 20 meq via ORAL
  Filled 2015-03-17: qty 1

## 2015-03-17 MED ORDER — FUROSEMIDE 10 MG/ML IJ SOLN
20.0000 mg | Freq: Once | INTRAMUSCULAR | Status: AC
  Administered 2015-03-17: 20 mg via INTRAVENOUS
  Filled 2015-03-17: qty 2

## 2015-03-17 MED ORDER — SODIUM CHLORIDE 0.9 % IV SOLN
Freq: Once | INTRAVENOUS | Status: AC
  Administered 2015-03-17: 15:00:00 via INTRAVENOUS

## 2015-03-17 NOTE — Progress Notes (Signed)
Pt alert and oriented. No complaints of pain. Vitals stable. Afib on monitor. No signs of bleeding. Pt tolerating clear liquids- Gave 1 unit of blood. Hgb improved 8.8. Now stepdown.

## 2015-03-17 NOTE — Progress Notes (Signed)
Pt is alert and oriented. No BM or bleeding noted this shift. A fib on CM. Foley in place. Good urine out put.no complaints of pain. Resting comfortably in bed witout any distress.

## 2015-03-17 NOTE — Progress Notes (Addendum)
Initial Nutrition Assessment  INTERVENTION: Medical Nutrition Supplement Therapy: will recommend  Boost Breeze BID secondary to NPO/CL day 5 and diet order just advanced to CL this afternoon.  NUTRITION DIAGNOSIS:  Inadequate oral intake related to inability to eat as evidenced by NPO status, just advanced to CL   GOAL:  Goal for diet progression and tolerance as medically able   MONITOR:  PO intake, Supplement acceptance, Diet advancement, Labs, Weight trends, I & O's, Digestive system, Anemia Profile, Glucose Profile  REASON FOR ASSESSMENT:  NPO/Clear Liquid Diet  ASSESSMENT:  Pt admitted with rectal bleeding, POD2 embolization of cecum/right colon and s/p transfusion. PMHx: DM, obesity, HTN, GIB, diverticulosis, colon polyps, afib  PO Intake: NPO/CL Day 5, Pt reports PTA appetite has been fair, eating cereal in the am and then another meal later in the day.  Medications: NS with KCl at 5210mL/hr, Protonix, Lasix, Zofran, KCl  Labs: Electrolyte and Renal Profile:    Recent Labs Lab 03/13/15 0647 03/14/15 0315 03/16/15 1305 03/17/15 0500  BUN 13 15 15   --   CREATININE 0.83 0.83 0.84  --   NA 140 145 140  --   K 3.0* 3.7 3.9 3.7  MG 1.8  --   --   --    Glucose Profile:  Recent Labs  03/15/15 0755 03/16/15 0740  GLUCAP 107* 132*   Protein Profile:  Recent Labs Lab 03/12/15 2128  ALBUMIN 2.9*  Anemia Profile: Hgb 7.8  Height:  Ht Readings from Last 1 Encounters:  03/13/15 5\' 4"  (1.626 m)    Weight:  Wt Readings from Last 1 Encounters:  03/15/15 221 lb 1.9 oz (100.3 kg)   Pt reports not knowing her UBW and not sure if it has been stable or not PTA.   Wt Readings from Last 10 Encounters:  03/15/15 221 lb 1.9 oz (100.3 kg)  10/25/14 207 lb (93.895 kg)  04/28/14 213 lb 8 oz (96.843 kg)  03/18/14 216 lb 12 oz (98.317 kg)  03/01/14 212 lb 8 oz (96.389 kg)  02/20/12 234 lb 6.4 oz (106.323 kg)  01/13/12 237 lb 9.6 oz (107.775 kg)  02/20/11 252 lb  12.8 oz (114.669 kg)  01/16/11 245 lb 2.1 oz (111.191 kg)  12/21/10 260 lb (117.935 kg)    BMI:  Body mass index is 37.94 kg/(m^2).  Estimated Nutritional Needs:  Kcal:  1333-1575kcals, BEE: 1010kcals, (IF 1.1-1.3)(AF 1.2) using IBW of 54.5kg  Protein:  55-65g protein (1.0-1.2g/kg) using IBW of 54.5kg  Fluid:  1363-163835mL of fluid (25-6230mL/kg) using IBW of 54.5kg  Diet Order:  Diet clear liquid Room service appropriate?: Yes; Fluid consistency:: Thin   Intake/Output Summary (Last 24 hours) at 03/17/15 1503 Last data filed at 03/17/15 1500  Gross per 24 hour  Intake   1395 ml  Output   6350 ml  Net  -4955 ml    Last BM:  5/5 loose bloody black medium stools  MODERATE Care Level   Leda QuailAllyson Aarik Blank, RD, LDN Pager [phone]

## 2015-03-17 NOTE — Consult Note (Signed)
Pt no longer bleeding after successful embolization.  I will sign off. Dr. Marva PandaSkulskie on call this weekend if rebleeds call him.

## 2015-03-17 NOTE — Progress Notes (Signed)
[facility]  Daily Progress Note   Subjective  - 2 Days Post-Op  Patient sleeping this morning No reported stools per nursing.  No obvious further bleeding No severe pain or signs of ischemia  Objective Filed Vitals:   03/17/15 0300 03/17/15 0400 03/17/15 0500 03/17/15 0600  BP:  100/42  84/74  Pulse: 105 101 99 116  Temp:   98.8 F (37.1 C)   TempSrc:   Oral   Resp: 22 19 19 18   Height:      Weight:      SpO2: 96% 97% 97% 97%    Intake/Output Summary (Last 24 hours) at 03/17/15 0832 Last data filed at 03/17/15 0547  Gross per 24 hour  Intake 1916.25 ml  Output   4875 ml  Net -2958.75 ml    PULM  CTAB CV  RRR VASC  Access site C/D/I  Laboratory CBC    Component Value Date/Time   WBC 16.9* 03/16/2015 0456   WBC 5.0 08/01/2014 1431   WBC 4.3 07/13/2014 1016   HGB 7.8* 03/17/2015 0500   HGB 11.1* 07/13/2014 1016   HCT 23.1* 03/17/2015 0500   HCT 34.5* 07/13/2014 1016   PLT 181 03/16/2015 0456   PLT 234 07/13/2014 1016    BMET    Component Value Date/Time   NA 140 03/16/2015 1305   NA 138 08/01/2014 1431   NA 143 02/21/2014 0011   K 3.7 03/17/2015 0500   K 4.3 02/21/2014 0011   CL 119* 03/16/2015 1305   CL 111* 02/21/2014 0011   CO2 23 03/16/2015 1305   CO2 25 02/21/2014 0011   GLUCOSE 124* 03/16/2015 1305   GLUCOSE 89 08/01/2014 1431   GLUCOSE 119* 02/21/2014 0011   BUN 15 03/16/2015 1305   BUN 16 08/01/2014 1431   BUN 22* 02/21/2014 0011   CREATININE 0.84 03/16/2015 1305   CREATININE 1.01 02/21/2014 0011   CALCIUM 6.8* 03/16/2015 1305   CALCIUM 8.2* 02/21/2014 0011   GFRNONAA >60 03/16/2015 1305   GFRNONAA 54* 02/21/2014 0011   GFRAA >60 03/16/2015 1305   GFRAA >60 02/21/2014 0011    Assessment/Planning: POD #2 s/p microbead embolization of cecum/right colon   Seems to have stopped bleeding  No further role for embolization at this point.   If she rebleeds, would consider another bleeding scan and surgery for  resection  Will sign off, call with questions    DEW,JASON  03/17/2015, 8:32 AM

## 2015-03-17 NOTE — Progress Notes (Signed)
Patient ID: [name], female   DOB: 1934-12-26, 80 y.o.   MRN: [id number] [facility] [employer] at [facility]   PATIENT NAME: [name]    MR#:  [id number]  DATE OF BIRTH:  06-22-1935  SUBJECTIVE:  CHIEF COMPLAINT:   Chief Complaint  Patient presents with  . Rectal Bleeding    pt reports dark red blood and blood clots in stool    who presents to the ED tonight after developing rectal bleeding. She states of the first bloody stool happened around 7 PM on the evening of admission. She states that she is passing frank blood along with some darker clots. Continued to have bleeding and developed a. Fib , RVR,  hypotension , transfused 9 u PRBC so far, but not hypotensive today,  remains tachycardic but heart rate is in the 110s. No more bleeding over the past 24 hours, hemoglobin level is 7.8 today.  He'll somewhat better, still admits of some shortness of breath when laying flat , swelling is subsiding with Lasix orally  REVIEW OF SYSTEMS:   CONSTITUTIONAL: No fever, fatigue or weakness.  EYES: No blurred or double vision.  EARS, NOSE, AND THROAT: No tinnitus or ear pain.  RESPIRATORY: No cough, shortness of breath, wheezing or hemoptysis.  CARDIOVASCULAR: No chest pain, orthopnea, edema.  GASTROINTESTINAL: No nausea, vomiting, diarrhea or abdominal pain.  GENITOURINARY: No dysuria, hematuria.  ENDOCRINE: No polyuria, nocturia,  HEMATOLOGY: No anemia, easy bruising or bleeding SKIN: No rash or lesion. MUSCULOSKELETAL: No joint pain or arthritis.   NEUROLOGIC: No tingling, numbness, weakness.  PSYCHIATRY: No anxiety or depression.   VITAL SIGNS: Blood pressure 84/74, pulse 116, temperature 98.1 F (36.7 C), temperature source Oral, resp. rate 18, height  (1.626 m), weight 100.3 kg (221 lb 1.9 oz), SpO2 97 %.  PHYSICAL EXAMINATION:   GENERAL:  80 y.o.-year-old patient lying in the bed with no acute distress. Pink cheeks .   EYES: Pupils equal, round,  reactive to light and accommodation. No scleral icterus. Extraocular muscles intact.  HEENT: Head atraumatic, normocephalic. Oropharynx and nasopharynx clear.  NECK:  Supple, no jugular venous distention. No thyroid enlargement, no tenderness.  LUNGS: Normal breath sounds bilaterally, no wheezing, few rales,rhonchi and crepitations at bases b/l. No use of accessory muscles of respiration.  CARDIOVASCULAR: S1, S2 normal. No murmurs, rubs, or gallops.  ABDOMEN: Soft, nontender, nondistended. Bowel sounds present. No organomegaly or mass. Minimal discomfort diffusely on abdominal palpation.   EXTREMITIES: severe pedal, LE, UE  edema, no cyanosis, or clubbing.  NEUROLOGIC: Cranial nerves II through XII are intact. Muscle strength 5/5 in all extremities. Sensation intact. Gait not checked.  PSYCHIATRIC: The patient is alert and oriented x 3.  SKIN: No obvious rash, lesion, or ulcer. Diffuse anasarca.   ORDERS/RESULTS REVIEWED:   CBC  Recent Labs Lab 03/12/15 2128  03/13/15 0647  03/14/15 0315 03/14/15 1528 03/15/15 0549 03/15/15 2018 03/16/15 0456 03/17/15 0500  WBC 5.1  --  5.4  --  9.7  --   --   --  16.9*  --   HGB 8.5*  < > 7.4*  < > 6.9* 7.6* 7.3* 6.0* 6.9* 7.8*  HCT 26.1*  --  22.5*  --  20.9* 23.8* 22.5* 17.8* 20.5* 23.1*  PLT 248  --  192  --  194  --   --   --  181  --   MCV 97.7  --  97.0  --  92.4  --   --   --  88.6  --   MCH 31.9  --  31.7  --  30.4  --   --   --  29.6  --   MCHC 32.6  --  32.7  --  32.9  --   --   --  33.4  --   RDW 17.6*  --  17.3*  --  19.3*  --   --   --  16.7*  --   LYMPHSABS 0.9*  --   --   --  2.0  --   --   --   --   --   MONOABS 0.5  --   --   --  0.9  --   --   --   --   --   EOSABS 0.2  --   --   --  0.1  --   --   --   --   --   BASOSABS 0.1  --   --   --  0.1  --   --   --   --   --   < > = values in this interval not  displayed. ------------------------------------------------------------------------------------------------------------------  Chemistries   Recent Labs Lab 03/12/15 2128 03/13/15 0647 03/14/15 0315 03/16/15 1305 03/17/15 0500  NA 140 140 145 140  --   K 2.9* 3.0* 3.7 3.9 3.7  CL 105 108 118* 119*  --   CO2 28 27 24 23   --   GLUCOSE 168* 93 125* 124*  --   BUN 13 13 15 15   --   CREATININE 0.98 0.83 0.83 0.84  --   CALCIUM 8.0* 7.6* 7.4* 6.8*  --   MG  --  1.8  --   --   --   AST 19  --   --   --   --   ALT 7*  --   --   --   --   ALKPHOS 78  --   --   --   --   BILITOT 1.0  --   --   --   --    ------------------------------------------------------------------------------------------------------------------ estimated creatinine clearance is 63.5 mL/min (by C-G formula based on Cr of 0.84). ------------------------------------------------------------------------------------------------------------------ No results for input(s): TSH, T4TOTAL, T3FREE, THYROIDAB in the last 72 hours.  Invalid input(s): FREET3  Cardiac Enzymes  Recent Labs Lab 03/12/15 2128  TROPONINI <0.03   ------------------------------------------------------------------------------------------------------------------ Invalid input(s): POCBNP ---------------------------------------------------------------------------------------------------------------  RADIOLOGY: Dg Chest Port 1 View  03/15/2015   CLINICAL DATA:  Central line placement.  EXAM: PORTABLE CHEST - 1 VIEW  COMPARISON:  Single view of the chest 03/12/2015.  FINDINGS: The patient has a new left IJ approach central venous catheter with the tip projecting at the superior cavoatrial junction. No pneumothorax identified. Right pleural effusion and basilar airspace disease persists. The left lung appears clear. Heart size is upper normal.  IMPRESSION: Tip of left IJ catheter projects at the superior cavoatrial junction. Negative for pneumothorax.  No  marked change in a small to moderate right pleural effusion and basilar airspace disease.   Electronically Signed   By: Drusilla Kannerhomas  Dalessio M.D.   On: 03/15/2015 18:02    EKG:  Orders placed or performed during the hospital encounter of 03/12/15  . ED EKG  . ED EKG  . EKG 12-Lead  . EKG 12-Lead    ASSESSMENT AND PLAN:    1. Acute posthemorrhagic anemia, s/p 9 U PRBC transfusion, will transfuse  1 more PRBC units today, following HGb  later today, , s/p embolization 5.4.16 by DR Gilda CreaseSchnier , following Hgb in am as well, start CLD, GI consult is appreciated, continue PPI IV.   2. GI bleed, initially suspected diverticular, but bleeding scan 5.4.16 showed  RLQ area active bleed, suspected small bowel, appreciate GI and  vascular inputs, now after embolizatrion procedure 5.4.16 by DR Gilda CreaseSchnier as above,  continue PPI IV drip  3 Hypotension, due to blood loss, better after 9 u pRBC  transfusions, transfuse 1 more u PRBC today , off metoprolol, continue digoxin for now, following clinically. May resume metoprolol if blood pressure is better for better heart rate control  4. Atrial fibrillation, RVR, resume metoprolol whenever blood pressure is better. For now will continue digoxin.  5. H/o Diverticulosis with ? diverticular bleed , as above  6. acute on chronic diastolic CHF (congestive heart failure), worsened   with transfusions, oxygenation is good on RA, continue lasix, following in's and outs as well as clinically  DRUG ALLERGIES:  Allergies  Allergen Reactions  . Diltiazem Hcl     REACTION: SOB/swelling    CODE STATUS:     Code Status Orders        Start     Ordered   03/13/15 0119  Full code   Continuous     03/13/15 0118         Meredyth Hornung M.D on 03/17/2015 at 2:13 PM  Between 7am to 6pm - Pager - [phone]  After 6pm go to [email or web address] - password [email or web address] [email or web address]  [facility]  Office  [phone]    Critical Time Spent in minutes   35  min D/w DR Karl PockKasa   Anissia Wessells M.D on 03/17/2015 at 2:13 PM  [facility]  [phone]

## 2015-03-18 ENCOUNTER — Inpatient Hospital Stay: Payer: Medicare Other

## 2015-03-18 LAB — HEMOGLOBIN
HEMOGLOBIN: 8.8 g/dL — AB (ref 12.0–16.0)
Hemoglobin: 8.7 g/dL — ABNORMAL LOW (ref 12.0–16.0)

## 2015-03-18 LAB — HEMATOCRIT: HEMATOCRIT: 26.6 % — AB (ref 35.0–47.0)

## 2015-03-18 LAB — POTASSIUM: Potassium: 3.5 mmol/L (ref 3.5–5.1)

## 2015-03-18 MED ORDER — LEVOFLOXACIN IN D5W 750 MG/150ML IV SOLN
750.0000 mg | INTRAVENOUS | Status: DC
  Filled 2015-03-18 (×3): qty 150

## 2015-03-18 MED ORDER — METOPROLOL TARTRATE 25 MG PO TABS
25.0000 mg | ORAL_TABLET | Freq: Two times a day (BID) | ORAL | Status: DC
  Administered 2015-03-18 – 2015-03-20 (×5): 25 mg via ORAL
  Filled 2015-03-18 (×5): qty 1

## 2015-03-18 MED ORDER — PIPERACILLIN-TAZOBACTAM 3.375 G IVPB
3.3750 g | Freq: Three times a day (TID) | INTRAVENOUS | Status: DC
  Administered 2015-03-18 – 2015-03-20 (×6): 3.375 g via INTRAVENOUS
  Filled 2015-03-18 (×12): qty 50

## 2015-03-18 MED ORDER — GUAIFENESIN-DM 100-10 MG/5ML PO SYRP
5.0000 mL | ORAL_SOLUTION | ORAL | Status: DC | PRN
  Administered 2015-03-19: 5 mL via ORAL
  Filled 2015-03-18: qty 5

## 2015-03-18 MED ORDER — METOPROLOL TARTRATE 25 MG/10 ML ORAL SUSPENSION: 25.0000 mg | Freq: Two times a day (BID) | ORAL | Status: DC

## 2015-03-18 NOTE — Evaluation (Signed)
Physical Therapy Evaluation Patient Details Name: [name] MRN: [id number] DOB: January 03, 1935 Today's Date: 03/18/2015   History of Present Illness  Pt with lower GI bleed, has had emoblization and multiple transfusions  Clinical Impression  Pt is a 80 y/o female here with recal bleed, now post embolization and multiple transfusions (HBG now 8.8).  She is feeling quite weak, but is eager to get out of bed and try to work with PT.  She does not want to go to rehab but is too weak to be able to safely go home at this time.  She is able to do some limited EOB ambulation, but fatigues quickly and struggles with R knee pain.  Pt will benefit from continued PT services.    Follow Up Recommendations SNF (pt wants to go home, but is not safe at this time)    Equipment Recommendations  Rolling walker with 5" wheels (TBD at rehab)    Recommendations for Other Services       Precautions / Restrictions Precautions Precautions: Fall Restrictions Weight Bearing Restrictions: No      Mobility  Bed Mobility Overal bed mobility: Needs Assistance Bed Mobility: Supine to Sit;Sit to Supine     Supine to sit: Min assist;Mod assist Sit to supine: Min assist;Mod assist      Transfers Overall transfer level: Needs assistance Equipment used: Rolling walker (2 wheeled) Transfers: Sit to/from Stand Sit to Stand: Min assist            Ambulation/Gait Ambulation/Gait assistance: Max assist;Mod assist Ambulation Distance (Feet):  (~6 side steps along EOB, also took 2 steps forward/back) Assistive device: Rolling walker (2 wheeled)       General Gait Details: Pt c/o R knee pain t/o, but clearly is weak and struggles t/o the bout.  She shows good effort, determination, but ultimately is very limited and unsteady  Information systems managertairs            Wheelchair Mobility    Modified Rankin (Stroke Patients Only)       Balance                                              Pertinent Vitals/Pain Pain Assessment: No/denies pain    Home Living Family/patient expects to be discharged to::  (Pt wants to go home, likely not safe at this time) Living Arrangements: Alone                    Prior Function Level of Independence: Independent with assistive device(s)               Hand Dominance        Extremity/Trunk Assessment   Upper Extremity Assessment: Overall WFL for tasks assessed (except L shoulder minimally limited)           Lower Extremity Assessment: Generalized weakness (but WFL)         Communication   Communication: No difficulties  Cognition Arousal/Alertness: Awake/alert Behavior During Therapy: WFL for tasks assessed/performed Overall Cognitive Status: Within Functional Limits for tasks assessed                      General Comments      Exercises        Assessment/Plan    PT Assessment Patient needs continued PT services  PT Diagnosis Difficulty walking;Generalized weakness  PT Problem List Decreased strength;Decreased activity tolerance;Decreased balance;Decreased mobility;Decreased safety awareness  PT Treatment Interventions Gait training;Therapeutic activities;Therapeutic exercise;Patient/family education;Balance training   PT Goals (Current goals can be found in the Care Plan section) Acute Rehab PT Goals PT Goal Formulation: With patient Time For Goal Achievement: 04/01/15 Potential to Achieve Goals: Good    Frequency Min 2X/week   Barriers to discharge        Co-evaluation               End of Session Equipment Utilized During Treatment: Gait belt Activity Tolerance: Patient limited by fatigue Patient left: with bed alarm set Nurse Communication: Mobility status         Time: [id number] PT Time Calculation (min) (ACUTE ONLY): 27 min   Charges:   PT Evaluation $Initial PT Evaluation Tier I: 1 Procedure     PT G Codes:       Loran SentersGalen [place]oku [place], PT, DPT [phone]   Malachi ProGalen R Tamotsu Wiederholt 03/18/2015, 4:30 PM

## 2015-03-18 NOTE — Progress Notes (Signed)
2 L of oxygen. A fib. A & O. Daughter at the bedside. Pt reports no pain. Protonix drip going. Tx from CCU. Report received from [facility]. Pt has no further concerns at this time.

## 2015-03-18 NOTE — Progress Notes (Signed)
Patient ID: Vernie AmmonsJoanne S Zoll, female   DOB: 1935-10-22, 80 y.o.   MRN: [id number] [facility] [place] [facility]   PATIENT NAME: Tessie FassJoanne Spooner    MR#:  [id number]  DATE OF BIRTH:  1935-10-22  SUBJECTIVE:  CHIEF COMPLAINT:   Chief Complaint  Patient presents with  . Rectal Bleeding    pt reports dark red blood and blood clots in stool    who presented to the ED  after developing rectal bleeding. She stated the first bloody stool happened around 7 PM on the evening of admission. Patient was subsequently hypertensive, developed a. Fib , RVR,  hypotension , transfused 10 u PRBC so far, but not hypotensive today. No more bleeding over the past 48  Hours after embolization by vascular surgery, hemoglobin level is 8.8 today.  She feels somewhat better but complaining of cough overnight.  REVIEW OF SYSTEMS:   CONSTITUTIONAL: No fever, fatigue , c/o weakness.  EYES: No blurred or double vision.  EARS, NOSE, AND THROAT: No tinnitus or ear pain.  RESPIRATORY: No cough, shortness of breath, has some  wheezing , no hemoptysis.  CARDIOVASCULAR: No chest pain, orthopnea, edema.  GASTROINTESTINAL: No nausea, vomiting, diarrhea or abdominal pain.  GENITOURINARY: No dysuria, hematuria.  ENDOCRINE: No polyuria, nocturia,  HEMATOLOGY: Has  Anemia, no easy bruising or bleeding SKIN: No rash or lesion. MUSCULOSKELETAL: No joint pain or arthritis.   NEUROLOGIC: No tingling, numbness, weakness.  PSYCHIATRY: No anxiety or depression.   VITAL SIGNS: Blood pressure 140/60, pulse 100, temperature 98.8 F (37.1 C), temperature source Oral, resp. rate 18, height 5\' 4"  (1.626 m), weight 100.3 kg (221 lb 1.9 oz), SpO2 99 %.  PHYSICAL EXAMINATION:   GENERAL:  80 y.o.-year-old patient lying in the bed with no acute distress. Pink cheeks .   EYES: Pupils equal, round, reactive to light and accommodation. No scleral icterus. Extraocular muscles intact.  HEENT: Head atraumatic,  normocephalic. Oropharynx and nasopharynx clear.  NECK:  Supple, no jugular venous distention. No thyroid enlargement, no tenderness.  LUNGS: Moderate air entry bilaterally, minimal wheezing bilaterally, posteriorly, few rales ,rhonchi and crepitations at bases b/l. No use of accessory muscles of respiration.  CARDIOVASCULAR: S1, S2 normal. No murmurs, rubs, or gallops.  ABDOMEN: Soft, nontender, nondistended. Bowel sounds present. No organomegaly or mass. Minimal discomfort diffusely on abdominal palpation.   EXTREMITIES: severe pedal, LE, UE  edema, no cyanosis, or clubbing.  NEUROLOGIC: Cranial nerves II through XII are intact. Muscle strength 5/5 in all extremities. Sensation intact. Gait not checked.  PSYCHIATRIC: The patient is alert and oriented x 3.  SKIN: No obvious rash, lesion, or ulcer. Diffuse anasarca.   ORDERS/RESULTS REVIEWED:   CBC  Recent Labs Lab 03/12/15 2128  03/13/15 0647  03/14/15 0315 03/14/15 1528 03/15/15 0549 03/15/15 2018 03/16/15 0456 03/17/15 0500 03/17/15 1800 03/18/15 0430  WBC 5.1  --  5.4  --  9.7  --   --   --  16.9*  --   --   --   HGB 8.5*  < > 7.4*  < > 6.9* 7.6* 7.3* 6.0* 6.9* 7.8* 8.8* 8.8*  HCT 26.1*  --  22.5*  --  20.9* 23.8* 22.5* 17.8* 20.5* 23.1*  --   --   PLT 248  --  192  --  194  --   --   --  181  --   --   --   MCV 97.7  --  97.0  --  92.4  --   --   --  88.6  --   --   --   MCH 31.9  --  31.7  --  30.4  --   --   --  29.6  --   --   --   MCHC 32.6  --  32.7  --  32.9  --   --   --  33.4  --   --   --   RDW 17.6*  --  17.3*  --  19.3*  --   --   --  16.7*  --   --   --   LYMPHSABS 0.9*  --   --   --  2.0  --   --   --   --   --   --   --   MONOABS 0.5  --   --   --  0.9  --   --   --   --   --   --   --   EOSABS 0.2  --   --   --  0.1  --   --   --   --   --   --   --   BASOSABS 0.1  --   --   --  0.1  --   --   --   --   --   --   --   < > = values in this interval not  displayed. ------------------------------------------------------------------------------------------------------------------  Chemistries   Recent Labs Lab 03/12/15 2128 03/13/15 0647 03/14/15 0315 03/16/15 1305 03/17/15 0500 03/18/15 0430  NA 140 140 145 140  --   --   K 2.9* 3.0* 3.7 3.9 3.7 3.5  CL 105 108 118* 119*  --   --   CO2 --   --   GLUCOSE 168* 93 125* 124*  --   --   BUN --   --   CREATININE 0.98 0.83 0.83 0.84  --   --   CALCIUM 8.0* 7.6* 7.4* 6.8*  --   --   MG  --  1.8  --   --   --   --   AST 19  --   --   --   --   --   ALT 7*  --   --   --   --   --   ALKPHOS 78  --   --   --   --   --   BILITOT 1.0  --   --   --   --   --    ------------------------------------------------------------------------------------------------------------------ estimated creatinine clearance is 63.5 mL/min (by C-G formula based on Cr of 0.84). ------------------------------------------------------------------------------------------------------------------ No results for input(s): TSH, T4TOTAL, T3FREE, THYROIDAB in the last 72 hours.  Invalid input(s): FREET3  Cardiac Enzymes  Recent Labs Lab 03/12/15 2128  TROPONINI <0.03   ------------------------------------------------------------------------------------------------------------------ Invalid input(s): POCBNP ---------------------------------------------------------------------------------------------------------------  RADIOLOGY: No results found.  EKG:  Orders placed or performed during the hospital encounter of 03/12/15  . ED EKG  . ED EKG  . EKG 12-Lead  . EKG 12-Lead    ASSESSMENT AND PLAN:    1. Acute posthemorrhagic anemia, s/p 10 U PRBC transfusion, hemoglobin is at 8.8 today and patient denies any other episodes of bleeding. we will follow up hemoglobin and hematocrit every 12 hours  today, , s/p embolization 5.4.16 by DR Gilda Crease ,  ,  GI consult is appreciated, continue PPI  IV.   2. GI bleed, initially suspected diverticular, but bleeding scan 5.4.16 showed  RLQ area active bleed, suspected small bowel, appreciate GI and  vascular inputs,  status post embolization  procedure 5.4.16 by DR Gilda CreaseSchnier as above,  continue PPI IV   3 Hypotension, due to blood loss, better after  10 u pRBC  Transfusions, off metoprolol, continue digoxin for now, following clinically. We will  resume metoprolol as  blood pressure is better today 4. Atrial fibrillation, RVR, resume metoprolol and will continue digoxin.  5. H/o Diverticulosis   6. acute on chronic diastolic CHF (congestive heart failure), worsened   with transfusions, oxygenation is good on RA, continue lasix, following in's and outs as well as clinically, will get chest x-ray and nebulizer treatments as needed basis. We will provide Robitussin as needed basis  7 .generalized weakness -we will consult physical therapy for deconditioning.   8. Hypocalcemia-will check albumin and repeat complete medical panel in a.m. If necessary will replete .  Transfer patient to telemetry    DRUG ALLERGIES:  Allergies  Allergen Reactions  . Diltiazem Hcl     REACTION: SOB/swelling    CODE STATUS:     Code Status Orders        Start     Ordered   03/13/15 0119  Full code   Continuous     03/13/15 0118         Ramonita LabGouru, Kolsen Choe M.D on 03/18/2015 at 9:01 AM  Between 7am to 6pm - Pager - [phone]  After 6pm go to [email or web address] - password [email or web address]  [facility]  Office  [phone]    Time Spent in minutes   35 min D/w RN and patient  Ramonita LabGouru, Sabien Umland M.D on 03/18/2015 at 9:01 AM  [facility] Office  [phone]

## 2015-03-19 LAB — CBC
HCT: 25.8 % — ABNORMAL LOW (ref 35.0–47.0)
Hemoglobin: 8.7 g/dL — ABNORMAL LOW (ref 12.0–16.0)
MCH: 30.3 pg (ref 26.0–34.0)
MCHC: 33.8 g/dL (ref 32.0–36.0)
MCV: 89.6 fL (ref 80.0–100.0)
PLATELETS: 208 10*3/uL (ref 150–440)
RBC: 2.88 MIL/uL — AB (ref 3.80–5.20)
RDW: 15.4 % — AB (ref 11.5–14.5)
WBC: 11.8 10*3/uL — ABNORMAL HIGH (ref 3.6–11.0)

## 2015-03-19 LAB — COMPREHENSIVE METABOLIC PANEL
ALK PHOS: 68 U/L (ref 38–126)
ALT: 7 U/L — ABNORMAL LOW (ref 14–54)
ANION GAP: 1 — AB (ref 5–15)
AST: 19 U/L (ref 15–41)
Albumin: 1.9 g/dL — ABNORMAL LOW (ref 3.5–5.0)
BUN: 14 mg/dL (ref 6–20)
CHLORIDE: 109 mmol/L (ref 101–111)
CO2: 27 mmol/L (ref 22–32)
Calcium: 7 mg/dL — ABNORMAL LOW (ref 8.9–10.3)
Creatinine, Ser: 0.76 mg/dL (ref 0.44–1.00)
GFR calc non Af Amer: >60 mL/min (ref >60)
GLUCOSE: 100 mg/dL — AB (ref 65–99)
POTASSIUM: 3.3 mmol/L — AB (ref 3.5–5.1)
Sodium: 137 mmol/L (ref 135–145)
Total Bilirubin: 0.9 mg/dL (ref 0.3–1.2)
Total Protein: 3.9 g/dL — ABNORMAL LOW (ref 6.5–8.1)

## 2015-03-19 LAB — HEMOGLOBIN
Hemoglobin: 8.8 g/dL — ABNORMAL LOW (ref 12.0–16.0)
Hemoglobin: 9 g/dL — ABNORMAL LOW (ref 12.0–16.0)

## 2015-03-19 LAB — HEMATOCRIT
HCT: 26.2 % — ABNORMAL LOW (ref 35.0–47.0)
HCT: 27.6 % — ABNORMAL LOW (ref 35.0–47.0)

## 2015-03-19 MED ORDER — SODIUM CHLORIDE 0.9 % IV SOLN
8.0000 mg/h | INTRAVENOUS | Status: DC
  Administered 2015-03-19 – 2015-03-20 (×3): 8 mg/h via INTRAVENOUS
  Filled 2015-03-19 (×2): qty 80

## 2015-03-19 MED ORDER — LEVOFLOXACIN IN D5W 750 MG/150ML IV SOLN
750.0000 mg | INTRAVENOUS | Status: DC
  Administered 2015-03-19: 750 mg via INTRAVENOUS
  Filled 2015-03-19 (×2): qty 150

## 2015-03-19 NOTE — Progress Notes (Signed)
Pt alertx4. Speech is slow. VSS. No complaints of pain. Pt consistently coughing. Medication given. Medication effective. [facility] in [facility]. Central Line dressing changed. Pt resting in bed. Will monitor,

## 2015-03-19 NOTE — Progress Notes (Signed)
Patient ID: [name], female   DOB: May 10, 1935, 80 y.o.   MRN: [id number] [facility] [place] [facility]   PATIENT NAME: [name]    MR#:  [id number]  DATE OF BIRTH:  May 10, 1935  SUBJECTIVE:  CHIEF COMPLAINT:   Chief Complaint  Patient presents with  . Rectal Bleeding    pt reports dark red blood and blood clots in stool    who presented to the ED  after developing rectal bleeding. Had EGD yesterday.  On clear liquid diet and denies any complaints today CONSTITUTIONAL: No fever, fatigue , c/o weakness.  EYES: No blurred or double vision.  EARS, NOSE, AND THROAT: No tinnitus or ear pain.  RESPIRATORY: No cough, shortness of breath, has some  wheezing , no hemoptysis.  CARDIOVASCULAR: No chest pain, orthopnea, edema.  GASTROINTESTINAL: No nausea, vomiting, diarrhea or abdominal pain.  GENITOURINARY: No dysuria, hematuria.  ENDOCRINE: No polyuria, nocturia,  HEMATOLOGY: Has  Anemia, no easy bruising or bleeding SKIN: No rash or lesion. MUSCULOSKELETAL: No joint pain or arthritis.   NEUROLOGIC: No tingling, numbness, weakness.  PSYCHIATRY: No anxiety or depression.   VITAL SIGNS: Blood pressure 117/52, pulse 65, temperature 97.5 F (36.4 C), temperature source Oral, resp. rate 20, height 5\' 4"  (1.626 m), weight 100.744 kg (222 lb 1.6 oz), SpO2 100 %.  PHYSICAL EXAMINATION:   GENERAL:  80 y.o.-year-old patient lying in the bed with no acute distress. Pink cheeks .   EYES: Pupils equal, round, reactive to light and accommodation. No scleral icterus. Extraocular muscles intact.  HEENT: Head atraumatic, normocephalic. Oropharynx and nasopharynx clear.  NECK:  Supple, no jugular venous distention. No thyroid enlargement, no tenderness.  LUNGS: Moderate air entry bilaterally, minimal wheezing bilaterally, posteriorly, few rales ,rhonchi and crepitations at bases b/l. No use of accessory muscles of respiration.  CARDIOVASCULAR: S1, S2 normal. No  murmurs, rubs, or gallops.  ABDOMEN: Soft, nontender, nondistended. Bowel sounds present. No organomegaly or mass. Minimal discomfort diffusely on abdominal palpation.   EXTREMITIES: severe pedal, LE, UE  edema, no cyanosis, or clubbing.  NEUROLOGIC: Cranial nerves II through XII are intact. Muscle strength 5/5 in all extremities. Sensation intact. Gait not checked.  PSYCHIATRIC: The patient is alert and oriented x 3.  SKIN: No obvious rash, lesion, or ulcer. Diffuse anasarca.   ORDERS/RESULTS REVIEWED:   CBC  Recent Labs Lab 03/12/15 2128  03/13/15 0647  03/14/15 0315  03/15/15 2018 03/16/15 0456 03/17/15 0500 03/17/15 1800 03/18/15 0430 03/18/15 1330 03/19/15 0542  WBC 5.1  --  5.4  --  9.7  --   --  16.9*  --   --   --   --  11.8*  HGB 8.5*  < > 7.4*  < > 6.9*  < > 6.0* 6.9* 7.8* 8.8* 8.8* 8.7* 8.7*  HCT 26.1*  --  22.5*  --  20.9*  < > 17.8* 20.5* 23.1*  --   --  26.6* 25.8*  PLT 248  --  192  --  194  --   --  181  --   --   --   --  208  MCV 97.7  --  97.0  --  92.4  --   --  88.6  --   --   --   --  89.6  MCH 31.9  --  31.7  --  30.4  --   --  29.6  --   --   --   --  30.3  MCHC 32.6  --  32.7  --  32.9  --   --  33.4  --   --   --   --  33.8  RDW 17.6*  --  17.3*  --  19.3*  --   --  16.7*  --   --   --   --  15.4*  LYMPHSABS 0.9*  --   --   --  2.0  --   --   --   --   --   --   --   --   MONOABS 0.5  --   --   --  0.9  --   --   --   --   --   --   --   --   EOSABS 0.2  --   --   --  0.1  --   --   --   --   --   --   --   --   BASOSABS 0.1  --   --   --  0.1  --   --   --   --   --   --   --   --   < > = values in this interval not displayed. ------------------------------------------------------------------------------------------------------------------  Chemistries   Recent Labs Lab 03/12/15 2128 03/13/15 0647 03/14/15 0315 03/16/15 1305 03/17/15 0500 03/18/15 0430 03/19/15 0542  NA 140 140 145 140  --   --  137  K 2.9* 3.0* 3.7 3.9 3.7 3.5 3.3*  CL  105 108 118* 119*  --   --  109  CO2 --   --  27  GLUCOSE 168* 93 125* 124*  --   --  100*  BUN --   --  14  CREATININE 0.98 0.83 0.83 0.84  --   --  0.76  CALCIUM 8.0* 7.6* 7.4* 6.8*  --   --  7.0*  MG  --  1.8  --   --   --   --   --   AST 19  --   --   --   --   --  19  ALT 7*  --   --   --   --   --  7*  ALKPHOS 78  --   --   --   --   --  68  BILITOT 1.0  --   --   --   --   --  0.9   ------------------------------------------------------------------------------------------------------------------ estimated creatinine clearance is 66.9 mL/min (by C-G formula based on Cr of 0.76). ------------------------------------------------------------------------------------------------------------------ No results for input(s): TSH, T4TOTAL, T3FREE, THYROIDAB in the last 72 hours.  Invalid input(s): FREET3  Cardiac Enzymes  Recent Labs Lab 03/12/15 2128  TROPONINI <0.03   ------------------------------------------------------------------------------------------------------------------ Invalid input(s): POCBNP ---------------------------------------------------------------------------------------------------------------  RADIOLOGY: Dg Chest 1 View  03/18/2015   CLINICAL DATA:  Shortness of breath.  EXAM: CHEST  1 VIEW  COMPARISON:  03/15/2015.  FINDINGS: The tip of the left jugular catheter is obscured, most likely at the superior cavoatrial junction. Stable mildly enlarged cardiac silhouette and mild prominence of the pulmonary vasculature and interstitial markings. No significant change in a small to moderate size right pleural effusion. Small left pleural effusion. Interval small amount of patchy opacity in the right upper lobe. Increased opacity in the left lower lobe. Stable opacity at the medial right lung base.  IMPRESSION: 1. Interval  mild right upper lobe pneumonia or alveolar edema. 2. Mild increase in size of a moderate size right pleural effusion with an  interval small left pleural effusion. 3. Progressive left lower lobe pneumonia or atelectasis. 4. Stable probable atelectasis at the medial right lung base. 5. Stable cardiomegaly, pulmonary vascular congestion and mild interstitial pulmonary edema.   Electronically Signed   By: Beckie SaltsSteven  Reid M.D.   On: 03/18/2015 09:55    EKG:  Orders placed or performed during the hospital encounter of 03/12/15  . ED EKG  . ED EKG  . EKG 12-Lead  . EKG 12-Lead    ASSESSMENT AND PLAN:    1. Acute posthemorrhagic anemia, s/p 10 U PRBC transfusion, hemoglobin is at 8.7  today and patient denies any other episodes of bleeding. we will follow up hemoglobin and hematocrit every 12 hours  , , s/p embolization 5.4.16 by DR Gilda CreaseSchnier ,  , GI consult is appreciated, continue PPI IV. Scheduled for colonoscopy in a.m.  2. GI bleed, initially suspected diverticular, but bleeding scan 5.4.16 showed  RLQ area active bleed, suspected small bowel, appreciate GI and  vascular inputs,  status post embolization  procedure 5.4.16 by DR Gilda CreaseSchnier as above,  continue PPI IV   3 Hypotension, due to blood loss, better after  10 u pRBC  Transfusions, off metoprolol, continue digoxin for now, following clinically. Better today. Resumed metoprolol as  blood pressure is better   4. Atrial fibrillation, RVR, resume metoprolol and will continue digoxin.  5. H/o Diverticulosis   6. acute on chronic diastolic CHF (congestive heart failure), worsened   with transfusions, oxygenation is good on RA, continue lasix, following in's and outs as well as clinically, will get chest x-ray and nebulizer treatments as needed basis. We will provide Robitussin as needed basis  7 .generalized weakness -we will consult physical therapy for deconditioning.   8. Hypocalcemia-albumin at 1.9, corrected calcium is at 8.7 , no need of calcium repletion at this time  Will plan to discharge patient home after colonoscopy if she is clinically stable in  a.m. Plan of care discussed in detail with the patient. She is aware of the plan   DRUG ALLERGIES:  Allergies  Allergen Reactions  . Diltiazem Hcl     REACTION: SOB/swelling    CODE STATUS:     Code Status Orders        Start     Ordered   03/13/15 0119  Full code   Continuous     03/13/15 0118         Ramonita LabGouru, Solveig Fangman M.D on 03/19/2015 at 1:08 PM  Between 7am to 6pm - Pager - [phone]  After 6pm go to [email or web address] - password [email or web address]  [facility]  Office  [phone]    Time Spent in minutes   35 min D/w RN and patient  Ramonita LabGouru, [place] Kirchner M.D on 03/19/2015 at 1:08 PM  [facility] Office  [phone]

## 2015-03-20 ENCOUNTER — Encounter
Admission: RE | Admit: 2015-03-20 | Discharge: 2015-03-20 | Disposition: A | Discharge status: Home / Self Care | Payer: Medicare Other | Source: Ambulatory Visit | Attending: Internal Medicine | Admitting: Internal Medicine

## 2015-03-20 DIAGNOSIS — R8290 Unspecified abnormal findings in urine: Secondary | ICD-10-CM | POA: Insufficient documentation

## 2015-03-20 LAB — TYPE AND SCREEN
ABO/RH(D): A POS
Antibody Screen: NEGATIVE
UNIT DIVISION: 0
UNIT DIVISION: 0
Unit division: 0
Unit division: 0

## 2015-03-20 LAB — BASIC METABOLIC PANEL
ANION GAP: 4 — AB (ref 5–15)
BUN: 13 mg/dL (ref 6–20)
CO2: 27 mmol/L (ref 22–32)
Calcium: 7.2 mg/dL — ABNORMAL LOW (ref 8.9–10.3)
Chloride: 108 mmol/L (ref 101–111)
Creatinine, Ser: 0.84 mg/dL (ref 0.44–1.00)
Glucose, Bld: 91 mg/dL (ref 65–99)
Potassium: 3.2 mmol/L — ABNORMAL LOW (ref 3.5–5.1)
Sodium: 139 mmol/L (ref 135–145)

## 2015-03-20 LAB — CBC
HCT: 25.7 % — ABNORMAL LOW (ref 35.0–47.0)
Hemoglobin: 8.6 g/dL — ABNORMAL LOW (ref 12.0–16.0)
MCH: 30.3 pg (ref 26.0–34.0)
MCHC: 33.5 g/dL (ref 32.0–36.0)
MCV: 90.5 fL (ref 80.0–100.0)
PLATELETS: 236 10*3/uL (ref 150–440)
RBC: 2.84 MIL/uL — ABNORMAL LOW (ref 3.80–5.20)
RDW: 15.2 % — AB (ref 11.5–14.5)
WBC: 9.2 10*3/uL (ref 3.6–11.0)

## 2015-03-20 LAB — HEMOGLOBIN: Hemoglobin: 8.9 g/dL — ABNORMAL LOW (ref 12.0–16.0)

## 2015-03-20 LAB — MAGNESIUM: Magnesium: 1.6 mg/dL — ABNORMAL LOW (ref 1.7–2.4)

## 2015-03-20 LAB — HEMATOCRIT: HEMATOCRIT: 26.3 % — AB (ref 35.0–47.0)

## 2015-03-20 MED ORDER — LEVOFLOXACIN 750 MG PO TABS: 750.0000 mg | ORAL_TABLET | Freq: Every day | ORAL | Status: DC

## 2015-03-20 MED ORDER — METOPROLOL TARTRATE 25 MG PO TABS: 25.0000 mg | ORAL_TABLET | Freq: Two times a day (BID) | ORAL | Status: DC

## 2015-03-20 MED ORDER — POTASSIUM CHLORIDE 20 MEQ PO PACK
40.0000 meq | PACK | Freq: Once | ORAL | Status: AC
  Administered 2015-03-20: 40 meq via ORAL
  Filled 2015-03-20: qty 2

## 2015-03-20 MED ORDER — BOOST / RESOURCE BREEZE PO LIQD: 1.0000 | Freq: Two times a day (BID) | ORAL | Status: DC

## 2015-03-20 MED ORDER — FERROUS SULFATE 325 (65 FE) MG PO TABS: 325.0000 mg | ORAL_TABLET | Freq: Two times a day (BID) | ORAL | Status: DC

## 2015-03-20 MED ORDER — POTASSIUM CHLORIDE ER 10 MEQ PO TBCR: 10.0000 meq | EXTENDED_RELEASE_TABLET | Freq: Every day | ORAL | Status: DC

## 2015-03-20 MED ORDER — ACETAMINOPHEN 325 MG PO TABS
650.0000 mg | ORAL_TABLET | Freq: Four times a day (QID) | ORAL | Status: DC | PRN
  Administered 2015-03-20: 650 mg via ORAL
  Filled 2015-03-20: qty 2

## 2015-03-20 MED ORDER — HYDROCODONE-ACETAMINOPHEN 5-325 MG PO TABS: 1.0000 | ORAL_TABLET | Freq: Four times a day (QID) | ORAL | Status: DC | PRN

## 2015-03-20 MED ORDER — DIGOXIN 125 MCG PO TABS: 0.1250 mg | ORAL_TABLET | Freq: Every day | ORAL | Status: DC

## 2015-03-20 MED ORDER — FUROSEMIDE 20 MG PO TABS: 20.0000 mg | ORAL_TABLET | Freq: Every day | ORAL | Status: DC

## 2015-03-20 MED ORDER — DIGOXIN 125 MCG PO TABS
0.1250 mg | ORAL_TABLET | Freq: Every day | ORAL | Status: DC
Start: 2015-03-20 — End: 2016-02-29

## 2015-03-20 MED ORDER — LEVOFLOXACIN 750 MG PO TABS: 750.0000 mg | ORAL_TABLET | Freq: Every day | ORAL | Status: AC

## 2015-03-20 NOTE — Care Management Note (Signed)
Case Management Note  Patient Details  Name: [name] MRN: [id number] Date of Birth: 1934/11/25  Subjective/Objective:                    Action/Plan:   Expected Discharge Date:                  Expected Discharge Plan:     In-House Referral:     Discharge planning Services     Post Acute Care Choice:    Choice offered to:     DME Arranged:    DME Agency:     HH Arranged:    HH Agency:     Status of Service:     Medicare Important Message Given:  YES  Date Medicare IM Given:   03/20/15 Medicare IM give by:   Jimmy Stipes, CM Date Additional Medicare IM Given:    Additional Medicare Important Message give by:     If discussed at Long Length of Stay Meetings, dates discussed:    Additional Comments:  Graison Leinberger A, RN 03/20/2015, 1:03 PM

## 2015-03-20 NOTE — Progress Notes (Signed)
80yo Mrs [name] was admitted 03/12/15 per rectal bleeding. On 03/15/15 the intestinal bleeding site was surgically repaired. Today she is sitting up in a chair and agrees that she would benefit from Rehab after this hospital discharge. PT is recommending SNF. CSW is aware. Mrs [name] denied having any preference of Rehab facility.

## 2015-03-20 NOTE — Progress Notes (Signed)
IV & telemetry discontinued.  Being [facility], pt left with EMS in a stable condition.

## 2015-03-20 NOTE — Progress Notes (Signed)
Pt to be discharged [facility]  this afternoon. She remains alert and oriented. No rectal bleeding. Voiding in bsc. Report to be called to facility.

## 2015-03-20 NOTE — Clinical Social Work Note (Signed)
CSW notified pt, facility and RN that [facility].  CSW signing off.

## 2015-03-20 NOTE — Discharge Summary (Signed)
[facility] [place] at [facility]   PATIENT NAME: [name]    MR#:  [id number]  DATE OF BIRTH:  1935/08/14  DATE OF ADMISSION:  03/12/2015 ADMITTING PHYSICIAN: Oralia Manis, MD  DATE OF DISCHARGE: 03/20/15  PRIMARY CARE PHYSICIAN: Kristie Cowman, MD    ADMISSION DIAGNOSIS:  Acute lower GI bleeding [K92.2]  DISCHARGE DIAGNOSIS:  Principal Problem:   GI bleed Active Problems:   Essential hypertension   Atrial fibrillation   Diverticulosis   Chronic diastolic CHF (congestive heart failure) HCAP  SECONDARY DIAGNOSIS:   Past Medical History  Diagnosis Date  . Personal history of colonic polyps   . Chronic a-fib     a. CHA2DS2VASc = 5 (HTN, age > 64, DM2, female);  b. Eliquis d/c'd 4/015 in setting of BRBPR/GIB;  c. Rate-control w/ BB.  . Type II or unspecified type diabetes mellitus without mention of complication, not stated as uncontrolled   . Obesity, unspecified   . Hyperlipidemia   . HTN (hypertension)   . Cellulitis and abscess of leg, except foot   . History of pneumonia   . Plantar fascial fibromatosis   . Diverticulosis   . RBBB   . GIB (gastrointestinal bleeding)     a. 02/2014 BRBPR;  b. 02/2014 Colonoscopy/EGD: diverticulosis with polys/polypectomy - ascending colon, Gastritis on EDG.  . Gastritis     a. 02/2014 EGD.  Marland Kitchen Anemia     a. 02/2014 in setting of GIB req 3U PRBC's.  . Atrial fibrillation 03/12/2015    HOSPITAL COURSE:   1. Acute posthemorrhagic anemia, patient came into the ED with rectal bleeding. Dr. Mariah Milling has recommended to stop Eliqis , patient was taking that for chronic atrial fibrillation. Patient has received  10 U PRBC transfusion during the hospital course. Status post embolization by vascular surgery as bleeding scan was positive with the right lower quadrant area active bleed. patient denies any other episodes of bleeding for the past 48 hours.. s/p embolization 5.4.16 by DR Gilda Crease , patient was seen by GI who  has recommended no other anticoagulation and to follow up with them as an outpatient   2. GI bleed, initially suspected diverticular, but bleeding scan 5.4.16 showed RLQ area active bleed, suspected small bowel, appreciate GI and vascular inputs, status post embolization procedure 5.4.16 by DR Gilda Crease as above, continue PPI   3 Hypotension, due to blood loss, better after 10 u pRBC Transfusions, metoprolol dose reduced, continue digoxin for now, following clinically. Better today.   4. Atrial fibrillation, RVR, resume metoprolol at low dose and will continue digoxin. No anticoagulants are advised at this time. Patient is to follow-up with Dr. Mariah Milling as an outpatient in one week. Repeat CBC, BMP to be considered by the PCP R cardiology  5. H/o Diverticulosis   6. acute on chronic diastolic CHF (congestive heart failure), worsened with transfusions, oxygenation is good on RA, continue lasix, following in's and outs as well as clinically, We will provide Robitussin as needed basis. Outpatient follow-up with CHF clinic is advised  7 .generalized weakness -seen by physical therapy and patient is getting transferred to skilled nursing care for continuation of rehabilitation..   8. Hypocalcemia-albumin at 1.9, corrected calcium is at 8.7 , no need of calcium repletion at this time  9. Healthcare associated pneumonia-repeat chest x-ray has revealed infiltrates. Patient is started on IV Zosyn and levofloxacin, clinical situation significantly improved and patient is getting discharged with by mouth levofloxacin for 5 more days  DISCHARGE CONDITIONS:   Satisfactory  CONSULTS OBTAINED:  Treatment Team:  Scot Junobert T Elliott, MD  DRUG ALLERGIES:   Allergies  Allergen Reactions  . Diltiazem Hcl     REACTION: SOB/swelling    DISCHARGE MEDICATIONS:   Current Discharge Medication List    START taking these medications   Details  digoxin (LANOXIN) 0.125 MG tablet Take 1 tablet (0.125  mg total) by mouth daily. Qty: 30 tablet, Refills: 0    feeding supplement, RESOURCE BREEZE, (RESOURCE BREEZE) LIQD Take 1 Container by mouth 2 (two) times daily with a meal. Qty: 60 Container, Refills: 0    HYDROcodone-acetaminophen (NORCO/VICODIN) 5-325 MG per tablet Take 1-2 tablets by mouth every 6 (six) hours as needed for moderate pain. Qty: 20 tablet, Refills: 0    levofloxacin (LEVAQUIN) 750 MG tablet Take 1 tablet (750 mg total) by mouth daily. Qty: 5 tablet, Refills: 0    potassium chloride (K-DUR) 10 MEQ tablet Take 1 tablet (10 mEq total) by mouth daily. Qty: 30 tablet, Refills: 0      CONTINUE these medications which have CHANGED   Details  ferrous sulfate (FERROUSUL) 325 (65 FE) MG tablet Take 1 tablet (325 mg total) by mouth 2 (two) times daily with a meal. Qty: 60 tablet, Refills: 3    furosemide (LASIX) 20 MG tablet Take 1 tablet (20 mg total) by mouth daily. Qty: 30 tablet, Refills: 0    metoprolol tartrate (LOPRESSOR) 25 MG tablet Take 1 tablet (25 mg total) by mouth 2 (two) times daily. Qty: 60 tablet, Refills: 0      CONTINUE these medications which have NOT CHANGED   Details  albuterol (PROVENTIL HFA) 108 (90 BASE) MCG/ACT inhaler Inhale 2 puffs into the lungs every 6 (six) hours as needed.      doxazosin (CARDURA) 4 MG tablet Take 1 tablet (4 mg total) by mouth daily. Qty: 90 tablet, Refills: 3    fluticasone (FLONASE) 50 MCG/ACT nasal spray Place 2 sprays into the nose 2 (two) times daily.       STOP taking these medications     omeprazole (PRILOSEC) 20 MG capsule          DISCHARGE INSTRUCTIONS:    Follow-up with primary care physician in 3-5 days, PCP to consider repeating CBC and Chem-8 in a week Cardiology in 1 week Gastroenterology in 3-4 weeks-they have recommended not to take any anticoagulants until evaluated by PCP or gastroenterology  If you experience worsening of your admission symptoms, develop shortness of breath, life  threatening emergency, suicidal or homicidal thoughts you must seek medical attention immediately by calling 911 or calling your MD immediately  if symptoms less severe.  You Must read complete instructions/literature along with all the possible adverse reactions/side effects for all the Medicines you take and that have been prescribed to you. Take any new Medicines after you have completely understood and accept all the possible adverse reactions/side effects.   Please note  You were cared for by a hospitalist during your hospital stay. If you have any questions about your discharge medications or the care you received while you were in the hospital after you are discharged, you can call the unit and asked to speak with the hospitalist on call if the hospitalist that took care of you is not available. Once you are discharged, your primary care physician will handle any further medical issues. Please note that NO REFILLS for any discharge medications will be authorized once you are discharged, as it  is imperative that you return to your primary care physician (or establish a relationship with a primary care physician if you do not have one) for your aftercare needs so that they can reassess your need for medications and monitor your lab values.    Today     HISTORY OF PRESENT ILLNESS:   : Please review history and physical for details ; [name] is a 80 y.o. female who presents to the ED tonight after developing rectal bleeding. She states of the first bloody stool happened around 7 PM on the evening of admission. She states that she is passing frank blood along with some darker clots. She has had a prior GI bleed one year ago, and has a history of diverticulosis. Notably she has a history of chronic A. fib and was on Eliquis at the time of her prior GI bleed. After that episode she and her cardiologist, Dr. Mariah MillingGollan, decided that she would stop taking medication, and so she has not taken it for  the past 8-9 months. Patient denies any significant associated symptoms, such as abdominal pain nausea vomiting recent fevers or chills. See full review of systems below. In the ED she was found to have hemoglobin of 8.5, this is from what seems to be a prior baseline of 11-12. Hospitals were called for admission for GI bleed. Patient's blood pressure is borderline low, but stable at this time.  REVIEW OF SYSTEMS:  CONSTITUTIONAL: No fever, fatigue or weakness.  EYES: No blurred or double vision.  EARS, NOSE, AND THROAT: No tinnitus or ear pain.  RESPIRATORY: No cough, shortness of breath, wheezing or hemoptysis.  CARDIOVASCULAR: No chest pain, orthopnea, edema.  GASTROINTESTINAL: No nausea, vomiting, diarrhea or abdominal pain.  GENITOURINARY: No dysuria, hematuria.  ENDOCRINE: No polyuria, nocturia,  HEMATOLOGY: No anemia, easy bruising or bleeding SKIN: No rash or lesion. MUSCULOSKELETAL: No joint pain or arthritis.   NEUROLOGIC: No tingling, numbness, weakness.  PSYCHIATRY: No anxiety or depression.   VITAL SIGNS:  Blood pressure 132/54, pulse 85, temperature 98 F (36.7 C), temperature source Oral, resp. rate 17, height 5\' 4"  (1.626 m), weight 100.88 kg (222 lb 6.4 oz), SpO2 100 %.  I/O:   Intake/Output Summary (Last 24 hours) at 03/20/15 1607 Last data filed at 03/20/15 1517  Gross per 24 hour  Intake 1114.16 ml  Output   2050 ml  Net -935.84 ml    PHYSICAL EXAMINATION:  GENERAL:  80 y.o.-year-old patient lying in the bed with no acute distress.  EYES: Pupils equal, round, reactive to light and accommodation. No scleral icterus. Extraocular muscles intact.  HEENT: Head atraumatic, normocephalic. Oropharynx and nasopharynx clear.  NECK:  Supple, no jugular venous distention. No thyroid enlargement, no tenderness.  LUNGS: Normal breath sounds bilaterally, no wheezing, rales,rhonchi or crepitation. No use of accessory muscles of respiration.  CARDIOVASCULAR: S1, S2 normal. No  murmurs, rubs, or gallops.  ABDOMEN: Soft, non-tender, non-distended. Bowel sounds present. No organomegaly or mass.  EXTREMITIES: No pedal edema, cyanosis, or clubbing.  NEUROLOGIC: Cranial nerves II through XII are intact. Sensation intact. Gait not checked.  PSYCHIATRIC: The patient is alert and oriented x 3. With the slow response to verbal commands probably some baseline dementia SKIN: No obvious rash, lesion, or ulcer.   DATA REVIEW:   CBC  Recent Labs Lab 03/20/15 0612 03/20/15 1020  WBC 9.2  --   HGB 8.6* 8.9*  HCT 25.7* 26.3*  PLT 236  --     Chemistries  Recent Labs Lab 03/19/15 0542 03/20/15 0612  NA 137 139  K 3.3* 3.2*  CL 109 108  CO2 27 27  GLUCOSE 100* 91  BUN 14 13  CREATININE 0.76 0.84  CALCIUM 7.0* 7.2*  MG  --  1.6*  AST 19  --   ALT 7*  --   ALKPHOS 68  --   BILITOT 0.9  --     Cardiac Enzymes No results for input(s): TROPONINI in the last 168 hours.  Microbiology Results  Results for orders placed or performed during the hospital encounter of 03/12/15  Culture, blood (routine x 2)     Status: None (Preliminary result)   Collection Time: 03/18/15  6:59 PM  Result Value Ref Range Status   Specimen Description BLOOD  Final   Special Requests Normal  Final   Culture NO GROWTH 2 DAYS  Final   Report Status PENDING  Incomplete  Culture, blood (routine x 2)     Status: None (Preliminary result)   Collection Time: 03/18/15  7:12 PM  Result Value Ref Range Status   Specimen Description BLOOD  Final   Special Requests Normal  Final   Culture NO GROWTH 2 DAYS  Final   Report Status PENDING  Incomplete    RADIOLOGY:  No results found.  EKG:   Orders placed or performed during the hospital encounter of 03/12/15  . ED EKG  . ED EKG  . EKG 12-Lead  . EKG 12-Lead      Management plans discussed with the patient, family and they are in agreement.  CODE STATUS:     Code Status Orders        Start     Ordered   03/13/15 0119   Full code   Continuous     03/13/15 0118      TOTAL TIME TAKING CARE OF THIS PATIENT: 45 minutes.    Ramonita Lab M.D on 03/20/2015 at 4:07 PM  Between 7am to 6pm - Pager - [phone]  After 6pm go to [email or web address] - password [email or web address] [facility]  [place] [facility]  Office  [phone]  CC: Primary care physician; Kristie Cowman, MD

## 2015-03-20 NOTE — Clinical Social Work Note (Signed)
Clinical Social Work Assessment  Patient Details  Name: [name] MRN: [id number] Date of Birth: Dec 30, 1934  Date of referral:  03/20/15               Reason for consult:  Facility Placement                Permission sought to share information with:  Case Manager, Magazine features editoracility Contact Representative, Family Supports Permission granted to share information::  Yes, Verbal Permission Granted  Name::        Agency::     Relationship::     Contact Information:     Housing/Transportation Living arrangements for the past 2 months:  Single Family Home (pt lives alone.) Source of Information:  Patient Patient Interpreter Needed:  None Criminal Activity/Legal Involvement Pertinent to Current Situation/Hospitalization:  No - Comment as needed Significant Relationships:  Neighbor, Other(Comment) (close friends Okey RegalCarol [phone]  and [phone]) Lives with:  Self Do you feel safe going back to the place where you live?  No (pt feels that she needs rehab before returning.) Need for family participation in patient care:  No (Coment)  Care giving concerns:  None expressed at this time   Office managerocial Worker assessment / plan:  CSW notify facility of pt's DC.  Employment status:  Retired Health and safety inspectornsurance information:  [employer] PT Recommendations:  Skilled Nursing Facility Information / Referral to community resources:  Skilled Nursing Facility  Patient/Family's Response to care:  Pt was very pleased to be going [facility]   Patient/Family's Understanding of and Emotional Response to Diagnosis, Current Treatment, and Prognosis:  Pt was in agreement with DC to [facility]  Emotional Assessment Appearance:  Appears older than stated age Attitude/Demeanor/Rapport:   (pt was pleasent when speaking with CSW) Affect (typically observed):  Accepting Orientation:  Oriented to Self, Oriented to Place, Oriented to  Time, Oriented to Situation Alcohol / Substance use:  Never Used Psych involvement  (Current and /or in the community):  No (Comment)  Discharge Needs  Concerns to be addressed:    Readmission within the last 30 days:    Current discharge risk:    Barriers to Discharge:      Chauncy PassyBennerson, Kinsie Belford J, LCSW 03/20/2015, 6:16 PM

## 2015-03-20 NOTE — Progress Notes (Signed)
Pt reporting left hip pain, only prn pain medication is vicodin.  Pt reports pain is mild, would like Tylenol. MD notified, Dr. Sheryle Hailiamond placing order for Tylenol.

## 2015-03-21 ENCOUNTER — Encounter: Payer: Self-pay | Admitting: Vascular Surgery

## 2015-03-21 LAB — ABO/RH: ABO/RH(D): A POS

## 2015-03-23 DIAGNOSIS — R8290 Unspecified abnormal findings in urine: Secondary | ICD-10-CM | POA: Diagnosis present

## 2015-03-23 LAB — URINALYSIS COMPLETE WITH MICROSCOPIC (ARMC ONLY)
Bacteria, UA: NONE SEEN
Bilirubin Urine: NEGATIVE
Glucose, UA: NEGATIVE mg/dL
Hgb urine dipstick: NEGATIVE
KETONES UR: NEGATIVE mg/dL
Leukocytes, UA: NEGATIVE
NITRITE: NEGATIVE
PROTEIN: NEGATIVE mg/dL
RBC / HPF: NONE SEEN RBC/hpf (ref 0–5)
Specific Gravity, Urine: 1.003 — ABNORMAL LOW (ref 1.005–1.030)
Squamous Epithelial / LPF: NONE SEEN
pH: 6 (ref 5.0–8.0)

## 2015-03-23 LAB — CULTURE, BLOOD (ROUTINE X 2)
CULTURE: NO GROWTH
Culture: NO GROWTH
Special Requests: NORMAL
Special Requests: NORMAL

## 2015-03-25 LAB — URINE CULTURE: Culture: NO GROWTH

## 2015-04-27 ENCOUNTER — Ambulatory Visit (INDEPENDENT_AMBULATORY_CARE_PROVIDER_SITE_OTHER): Payer: Medicare Other | Admitting: Cardiovascular Disease

## 2015-04-27 ENCOUNTER — Other Ambulatory Visit
Admission: RE | Admit: 2015-04-27 | Discharge: 2015-04-27 | Disposition: A | Discharge status: Home / Self Care | Payer: Medicare Other | Source: Ambulatory Visit | Attending: Cardiovascular Disease | Admitting: Cardiovascular Disease

## 2015-04-27 ENCOUNTER — Encounter: Payer: Self-pay | Admitting: Cardiovascular Disease

## 2015-04-27 ENCOUNTER — Encounter (INDEPENDENT_AMBULATORY_CARE_PROVIDER_SITE_OTHER): Payer: Self-pay

## 2015-04-27 VITALS — BP 108/60 | HR 64 | Ht 64.0 in | Wt 202.5 lb

## 2015-04-27 DIAGNOSIS — I5032 Chronic diastolic (congestive) heart failure: Secondary | ICD-10-CM

## 2015-04-27 DIAGNOSIS — E785 Hyperlipidemia, unspecified: Secondary | ICD-10-CM

## 2015-04-27 DIAGNOSIS — I4891 Unspecified atrial fibrillation: Secondary | ICD-10-CM | POA: Diagnosis not present

## 2015-04-27 DIAGNOSIS — I10 Essential (primary) hypertension: Secondary | ICD-10-CM

## 2015-04-27 DIAGNOSIS — K5791 Diverticulosis of intestine, part unspecified, without perforation or abscess with bleeding: Secondary | ICD-10-CM

## 2015-04-27 DIAGNOSIS — D509 Iron deficiency anemia, unspecified: Secondary | ICD-10-CM

## 2015-04-27 LAB — CBC WITH DIFFERENTIAL/PLATELET
BASOS ABS: 0 10*3/uL (ref 0.0–0.1)
BASOS PCT: 0 % (ref 0–1)
Band Neutrophils: 1 % (ref 0–10)
Blasts: 0 %
EOS ABS: 0 10*3/uL (ref 0.0–0.7)
Eosinophils Relative: 1 % (ref 0–5)
HEMATOCRIT: 34.7 % — AB (ref 35.0–47.0)
Hemoglobin: 11.2 g/dL — ABNORMAL LOW (ref 12.0–16.0)
LYMPHS ABS: 1.7 10*3/uL (ref 0.7–4.0)
LYMPHS PCT: 41 % (ref 12–46)
MCH: 32.2 pg (ref 26.0–34.0)
MCHC: 32.3 g/dL (ref 32.0–36.0)
MCV: 99.6 fL (ref 80.0–100.0)
MONOS PCT: 4 % (ref 3–12)
Metamyelocytes Relative: 0 %
Monocytes Absolute: 0.2 10*3/uL (ref 0.1–1.0)
Myelocytes: 0 %
NEUTROS ABS: 2.2 10*3/uL (ref 1.7–7.7)
NEUTROS PCT: 53 % (ref 43–77)
OTHER: 0 %
Platelets: 265 10*3/uL (ref 150–440)
Promyelocytes Absolute: 0 %
RBC: 3.49 MIL/uL — ABNORMAL LOW (ref 3.80–5.20)
RDW: 23.4 % — ABNORMAL HIGH (ref 11.5–14.5)
WBC: 4.1 10*3/uL (ref 3.6–11.0)
nRBC: 0 /100 WBC

## 2015-04-27 LAB — MAGNESIUM: Magnesium: 2.2 mg/dL (ref 1.7–2.4)

## 2015-04-27 MED ORDER — FUROSEMIDE 20 MG PO TABS: 20.0000 mg | ORAL_TABLET | Freq: Two times a day (BID) | ORAL | Status: DC | PRN

## 2015-04-27 NOTE — Assessment & Plan Note (Signed)
Blood pressure is well controlled on today's visit. No changes made to the medications. 

## 2015-04-27 NOTE — Assessment & Plan Note (Signed)
Hospital records reviewed with her. Recent GI bleed, malaise a patient. Followed by Dr. Markham Jordan.  We'll hold all anticoagulation in the future

## 2015-04-27 NOTE — Assessment & Plan Note (Signed)
Most recent lipid panel not available. We will look for routine lab work to primary care

## 2015-04-27 NOTE — Assessment & Plan Note (Signed)
Recommended that she stay on Lasix 20 mg daily. Would monitor her weight and take extra Lasix in the afternoon for weight gain or worsening leg edema

## 2015-04-27 NOTE — Progress Notes (Signed)
Patient ID: [name], female    DOB: [birth year], 80 y.o.   MRN: [id number]  HPI Comments: 80 y/o female with a prior h/o permanent rate-controlled atrial fibrillation, anticoagulated with eliquis 5 mg twice a day  she had GI bleed April 2015 found to have gastritis, diverticulosis, polyps requiring polypectomy. Since then all anticoagulation has been held  she presents today for follow-up of her atrial fibrillation   She reports that at the beginning of May 2016, she had GI bleed. Hemoglobin down to 6.0 on 03/15/2015  she was not on anticoagulation at the time  She had bleeding scan, embolization , required packed red blood cell transfusion at least 10 units  Sent to rehabilitation at [facility].  She reports fair she was started on digoxin  By Dr. Dareen Piano but she does not know why  she has had significant weight loss over the past month , edema has improved.  Now with trace leg edema, ambulating more, trying to get her strength back.  Going to the beach soon for 2 weeks.   EKG shows  Atrial fibrillation with ventricular rate 64 bpm, right bundle branch block , diffuse ST and T wave abnormality   other past medical history In April 2015, she was admitted to [facility] with fatigue and BRBPR.  Eliquis was held and she required 3 U PRBC's during her admission.  She was seen by GI and underwent EGD and colonoscopy showing gastritis and diverticulosis with colon polyps req polypectomy.  She was placed on prilosec and H/H remained stable.     Recent echocardiogram 03/10/2014: - Left ventricle: Systolic function was normal. The estimated ejection fraction was in the range of 55% to 65%. Wall motion was normal; there were no regional wall   motion abnormalities. - Mitral valve: Mild to moderate regurgitation. - Left atrium: The atrium was mildly dilated. - Right atrium: The atrium was moderately dilated. - Tricuspid valve: Moderate-severe regurgitation. - Pulmonary arteries: PA peak pressure: 51mm  Hg (S).     Allergies  Allergen Reactions  . Diltiazem Hcl     REACTION: SOB/swelling    Outpatient Encounter Prescriptions as of 04/27/2015  Medication Sig  . albuterol (PROVENTIL HFA) 108 (90 BASE) MCG/ACT inhaler Inhale 2 puffs into the lungs every 6 (six) hours as needed.    . digoxin (LANOXIN) 0.125 MG tablet Take 1 tablet (0.125 mg total) by mouth daily.  Marland Kitchen doxazosin (CARDURA) 4 MG tablet Take 1 tablet (4 mg total) by mouth daily.  . ferrous sulfate (FERROUSUL) 325 (65 FE) MG tablet Take 1 tablet (325 mg total) by mouth 2 (two) times daily with a meal.  . fluticasone (FLONASE) 50 MCG/ACT nasal spray Place 2 sprays into the nose 2 (two) times daily.   . furosemide (LASIX) 20 MG tablet Take 1 tablet (20 mg total) by mouth 2 (two) times daily as needed.  Marland Kitchen HYDROcodone-acetaminophen (NORCO/VICODIN) 5-325 MG per tablet Take 1-2 tablets by mouth every 6 (six) hours as needed for moderate pain.  . metoprolol tartrate (LOPRESSOR) 25 MG tablet Take 1 tablet (25 mg total) by mouth 2 (two) times daily.  . potassium chloride (K-DUR) 10 MEQ tablet Take 1 tablet (10 mEq total) by mouth daily.  . [DISCONTINUED] furosemide (LASIX) 20 MG tablet Take 1 tablet (20 mg total) by mouth daily.  . [DISCONTINUED] feeding supplement, RESOURCE BREEZE, (RESOURCE BREEZE) LIQD Take 1 Container by mouth 2 (two) times daily with a meal. (Patient not taking: Reported on 04/27/2015)  No facility-administered encounter medications on file as of 04/27/2015.    Past Medical History  Diagnosis Date  . Personal history of colonic polyps   . Chronic a-fib     a. CHA2DS2VASc = 5 (HTN, age > 84, DM2, female);  b. Eliquis d/c'd 4/015 in setting of BRBPR/GIB;  c. Rate-control w/ BB.  . Type II or unspecified type diabetes mellitus without mention of complication, not stated as uncontrolled   . Obesity, unspecified   . Hyperlipidemia   . HTN (hypertension)   . Cellulitis and abscess of leg, except foot   . History of  pneumonia   . Plantar fascial fibromatosis   . Diverticulosis   . RBBB   . GIB (gastrointestinal bleeding)     a. 02/2014 BRBPR;  b. 02/2014 Colonoscopy/EGD: diverticulosis with polys/polypectomy - ascending colon, Gastritis on EDG.  . Gastritis     a. 02/2014 EGD.  Marland Kitchen Anemia     a. 02/2014 in setting of GIB req 3U PRBC's.  . Atrial fibrillation 03/12/2015    Past Surgical History  Procedure Laterality Date  . No past surgeries    . Peripheral vascular catheterization N/A 03/15/2015    Procedure: Visceral Angiography;  Surgeon: Annice Needy, MD;  Location: [facility] INVASIVE CV LAB;  Service: Cardiovascular;  Laterality: N/A;  . Embolization N/A 03/15/2015    Procedure: Embolization;  Surgeon: Annice Needy, MD;  Location: [facility] INVASIVE CV LAB;  Service: Cardiovascular;  Laterality: N/A;    Social History  reports that she has never smoked. She has never used smokeless tobacco. She reports that she does not drink alcohol or use illicit drugs.  Family History family history includes Asthma in her father; Emphysema in her father; Heart attack in her father; Hypertension in her mother.   Review of Systems  Respiratory: Negative.   Cardiovascular: Negative.   Gastrointestinal: Negative.   Musculoskeletal: Negative.   Neurological: Negative.   Hematological: Negative.   Psychiatric/Behavioral: Negative.   All other systems reviewed and are negative.   BP 108/60 mmHg  Pulse 64  Ht 5\' 4"  (1.626 m)  Wt 202 lb 8 oz (91.853 kg)  BMI 34.74 kg/m2  Physical Exam  Constitutional: She is oriented to person, place, and time. She appears well-developed and well-nourished.  Presenting today in a wheelchair  HENT:  Head: Normocephalic.  Nose: Nose normal.  Mouth/Throat: Oropharynx is clear and moist.  Eyes: Conjunctivae are normal. Pupils are equal, round, and reactive to light.  Neck: Normal range of motion. Neck supple. No JVD present.  Cardiovascular: Normal rate, S1 normal, S2 normal, normal  heart sounds and intact distal pulses.  An irregularly irregular rhythm present. Exam reveals no gallop and no friction rub.   No murmur heard. Trace ankle edema  Pulmonary/Chest: Effort normal and breath sounds normal. No respiratory distress. She has no wheezes. She has no rales. She exhibits no tenderness.  Abdominal: Soft. Bowel sounds are normal. She exhibits no distension. There is no tenderness.  Musculoskeletal: Normal range of motion. She exhibits no edema or tenderness.  Lymphadenopathy:    She has no cervical adenopathy.  Neurological: She is alert and oriented to person, place, and time. Coordination normal.  Skin: Skin is warm and dry. No rash noted. No erythema.  Psychiatric: She has a normal mood and affect. Her behavior is normal. Judgment and thought content normal.    Assessment and Plan  Nursing note and vitals reviewed.

## 2015-04-27 NOTE — Assessment & Plan Note (Addendum)
Chronic atrial fibrillation,  Not on anticoagulation  Given recent GI bleeding 2 in the past year  we will hold digitoxin as heart rate well controlled,  Normal ejection fraction

## 2015-04-27 NOTE — Patient Instructions (Signed)
You are doing well.  Please hold the digoxin  Take extra lasix for ankle swelling or weight gain  Please call us if you have new issues that need to be addressed before your next appt.  Your physician wants you to follow-up in: 6 months.  You will receive a reminder letter in the mail two months in advance. If you don't receive a letter, please call our office to schedule the follow-up appointment.

## 2015-10-01 ENCOUNTER — Other Ambulatory Visit: Payer: Self-pay | Admitting: Cardiovascular Disease

## 2015-10-25 ENCOUNTER — Other Ambulatory Visit: Payer: Self-pay | Admitting: Cardiovascular Disease

## 2015-11-24 ENCOUNTER — Ambulatory Visit: Payer: Medicare Other | Admitting: Nurse Practitioner

## 2016-01-16 ENCOUNTER — Ambulatory Visit: Payer: Medicare Other | Admitting: Cardiovascular Disease

## 2016-02-29 ENCOUNTER — Ambulatory Visit (INDEPENDENT_AMBULATORY_CARE_PROVIDER_SITE_OTHER): Payer: Medicare Other | Admitting: Cardiovascular Disease

## 2016-02-29 ENCOUNTER — Encounter: Payer: Self-pay | Admitting: Cardiovascular Disease

## 2016-02-29 ENCOUNTER — Telehealth: Payer: Self-pay | Admitting: Cardiovascular Disease

## 2016-02-29 VITALS — BP 120/80 | HR 58 | Ht 65.0 in | Wt 210.3 lb

## 2016-02-29 DIAGNOSIS — I482 Chronic atrial fibrillation, unspecified: Secondary | ICD-10-CM

## 2016-02-29 DIAGNOSIS — K5791 Diverticulosis of intestine, part unspecified, without perforation or abscess with bleeding: Secondary | ICD-10-CM | POA: Diagnosis not present

## 2016-02-29 DIAGNOSIS — R2681 Unsteadiness on feet: Secondary | ICD-10-CM

## 2016-02-29 DIAGNOSIS — I10 Essential (primary) hypertension: Secondary | ICD-10-CM

## 2016-02-29 DIAGNOSIS — I5032 Chronic diastolic (congestive) heart failure: Secondary | ICD-10-CM | POA: Diagnosis not present

## 2016-02-29 NOTE — Telephone Encounter (Signed)
Pt's med list updated.

## 2016-02-29 NOTE — Assessment & Plan Note (Signed)
History of GI bleeding as detailed above, 2015 and 2016 No bleeding so far in 2017 per the patient

## 2016-02-29 NOTE — Assessment & Plan Note (Signed)
Amylase with a cane. No regular exercise, denies any recent falls but is certainly high fall risk.

## 2016-02-29 NOTE — Assessment & Plan Note (Signed)
Chronic atrial fibrillation, has not been a candidate for anticoagulation in the past secondary to GI bleed both on anticoagulation and without anticoagulation. Large bleeds in 2015, 2016 Patient was told by GI who did her workup that she should not take anticoagulation again. Long discussion with her concerning various treatment options, recommended that we inquire about a watchman device. Long discussion with her concerning  the device itself and what it entails. We will Dr. Johney FrameAllred ask to review her chart and determine if she may be a candidate , at which time, we would [place] a [place] [facility] [place] . We have recommended that she bring family if needed

## 2016-02-29 NOTE — Patient Instructions (Addendum)
You are doing well. No medication changes were made.  We will call you early next week to discuss whether you might need a "Watchman" device for stroke prevention  Please confirm the dosing of the metoprolol (100 mg or 25 mg?)  Please call us if you have new issues that need to be addressed before your next appt.  Your physician wants you to follow-up in: 6 months.  You will receive a reminder letter in the mail two months in advance. If you don't receive a letter, please call our office to schedule the follow-up appointment.

## 2016-02-29 NOTE — Progress Notes (Signed)
Patient ID: [name], female    DOB: 12/21/1934, 81 y.o.   MRN: [id number]  HPI Comments: 81 y/o female with a prior h/o permanent rate-controlled atrial fibrillation, previously anticoagulated with eliquis 5 mg twice a day  But then developed GI bleed April 2015 found to have gastritis, diverticulosis, polyps requiring polypectomy. Since then all anticoagulation has been held. Additional bleed May 2016, not on anticoagulation at that time as detailed below. She has been told in the past by her GI physicians never to take anticoagulation again  she presents today for follow-up of her atrial fibrillation  In follow-up, she reports that she feels well with no complaints. Balance is poor, walks with a cane. She denies any tachycardia, chest discomfort.  She denies any significant leg edema. She continues to take Lasix when necessary Blood pressure at home typically well controlled Some confusion concerning her metoprolol, she feels that she is taking 100 mg twice a day. She did not bring her medication list with her Notes from primary care reviewed, she had follow-up for diabetes and hypertension Lab work showing hemoglobin A1c 4.7,  EKG on today's visit showing atrial fibrillation with ventricular rate 58 bpm, right bundle branch block  Other past medical history May 2016, she had GI bleed secondary to diverticular bleed. Hemoglobin down to 6.0 on 03/15/2015  she was not on anticoagulation at the time  She had bleeding scan, embolization , required packed red blood cell transfusion at least 10 units  Sent to rehabilitation at [facility].  In April 2015, she was admitted to [facility] with fatigue and BRBPR.  Eliquis was held and she required 3 U PRBC's during her admission.  She was seen by GI and underwent EGD and colonoscopy showing gastritis and diverticulosis with colon polyps req polypectomy.  She was placed on prilosec and H/H remained stable.    Recent echocardiogram 03/10/2014: - Left  ventricle: Systolic function was normal. The estimated ejection fraction was in the range of 55% to 65%. Wall motion was normal; there were no regional wall   motion abnormalities. - Mitral valve: Mild to moderate regurgitation. - Left atrium: The atrium was mildly dilated. - Right atrium: The atrium was moderately dilated. - Tricuspid valve: Moderate-severe regurgitation. - Pulmonary arteries: PA peak pressure: 51mm Hg (S).     Allergies  Allergen Reactions  . Diltiazem Hcl     REACTION: SOB/swelling    Outpatient Encounter Prescriptions as of 02/29/2016  Medication Sig  . albuterol (PROVENTIL HFA) 108 (90 BASE) MCG/ACT inhaler Inhale 2 puffs into the lungs every 6 (six) hours as needed.    . doxazosin (CARDURA) 4 MG tablet Take 1 tablet (4 mg total) by mouth daily.  . ferrous sulfate (FERROUSUL) 325 (65 FE) MG tablet Take 1 tablet (325 mg total) by mouth 2 (two) times daily with a meal.  . fluticasone (FLONASE) 50 MCG/ACT nasal spray Place 2 sprays into the nose 2 (two) times daily.   . furosemide (LASIX) 20 MG tablet Take 1 tablet (20 mg total) by mouth 2 (two) times daily as needed.  Marland Kitchen HYDROcodone-acetaminophen (NORCO/VICODIN) 5-325 MG per tablet Take 1-2 tablets by mouth every 6 (six) hours as needed for moderate pain.  . metoprolol (LOPRESSOR) 100 MG tablet TAKE 1 TABLET TWICE A DAY  . metoprolol tartrate (LOPRESSOR) 25 MG tablet Take 1 tablet (25 mg total) by mouth 2 (two) times daily.  . potassium chloride (K-DUR) 10 MEQ tablet Take 1 tablet (10 mEq total) by mouth  daily.  . [DISCONTINUED] digoxin (LANOXIN) 0.125 MG tablet Take 1 tablet (0.125 mg total) by mouth daily. (Patient not taking: Reported on 02/29/2016)   No facility-administered encounter medications on file as of 02/29/2016.    Past Medical History  Diagnosis Date  . Personal history of colonic polyps   . Chronic a-fib (HCC)     a. CHA2DS2VASc = 5 (HTN, age > 3075, DM2, female);  b. Eliquis d/c'd 4/015 in setting  of BRBPR/GIB;  c. Rate-control w/ BB.  . Type II or unspecified type diabetes mellitus without mention of complication, not stated as uncontrolled   . Obesity, unspecified   . Hyperlipidemia   . HTN (hypertension)   . Cellulitis and abscess of leg, except foot   . History of pneumonia   . Plantar fascial fibromatosis   . Diverticulosis   . RBBB   . GIB (gastrointestinal bleeding)     a. 02/2014 BRBPR;  b. 02/2014 Colonoscopy/EGD: diverticulosis with polys/polypectomy - ascending colon, Gastritis on EDG.  . Gastritis     a. 02/2014 EGD.  Marland Kitchen. Anemia     a. 02/2014 in setting of GIB req 3U PRBC's.  . Atrial fibrillation (HCC) 03/12/2015    Past Surgical History  Procedure Laterality Date  . No past surgeries    . Peripheral vascular catheterization N/A 03/15/2015    Procedure: Visceral Angiography;  Surgeon: Annice NeedyJason S Dew, MD;  Location: [facility] INVASIVE CV LAB;  Service: Cardiovascular;  Laterality: N/A;  . Embolization N/A 03/15/2015    Procedure: Embolization;  Surgeon: Annice NeedyJason S Dew, MD;  Location: [facility] INVASIVE CV LAB;  Service: Cardiovascular;  Laterality: N/A;    Social History  reports that she has never smoked. She has never used smokeless tobacco. She reports that she does not drink alcohol or use illicit drugs.  Family History family history includes Asthma in her father; Emphysema in her father; Heart attack in her father; Hypertension in her mother.   Review of Systems  Respiratory: Negative.   Cardiovascular: Negative.   Gastrointestinal: Negative.   Musculoskeletal: Negative.   Neurological: Negative.   Hematological: Negative.   Psychiatric/Behavioral: Negative.   All other systems reviewed and are negative.   BP 120/80 mmHg  Pulse 58  Ht 5\' 5"  (1.651 m)  Wt 210 lb 5 oz (95.397 kg)  BMI 35.00 kg/m2  Physical Exam  Constitutional: She is oriented to person, place, and time. She appears well-developed and well-nourished.  Walks with a cane  HENT:  Head: Normocephalic.   Nose: Nose normal.  Mouth/Throat: Oropharynx is clear and moist.  Eyes: Conjunctivae are normal. Pupils are equal, round, and reactive to light.  Neck: Normal range of motion. Neck supple. No JVD present.  Cardiovascular: Normal rate, S1 normal, S2 normal, normal heart sounds and intact distal pulses.  An irregularly irregular rhythm present. Exam reveals no gallop and no friction rub.   No murmur heard. Trace ankle edema  Pulmonary/Chest: Effort normal and breath sounds normal. No respiratory distress. She has no wheezes. She has no rales. She exhibits no tenderness.  Abdominal: Soft. Bowel sounds are normal. She exhibits no distension. There is no tenderness.  Musculoskeletal: Normal range of motion. She exhibits no edema or tenderness.  Lymphadenopathy:    She has no cervical adenopathy.  Neurological: She is alert and oriented to person, place, and time. Coordination normal.  Skin: Skin is warm and dry. No rash noted. No erythema.  Psychiatric: She has a normal mood and affect. Her behavior is  normal. Judgment and thought content normal.    Assessment and Plan  Nursing note and vitals reviewed.

## 2016-02-29 NOTE — Assessment & Plan Note (Signed)
Blood pressure is well controlled on today's visit. No changes made to the medications.   Total encounter time more than 25 minutes  Greater than 50% was spent in counseling and coordination of care with the patient  

## 2016-02-29 NOTE — Telephone Encounter (Signed)
Pt states that the  Metoprolol 100mg  twice a day is the correct dosage she has been taking.

## 2016-03-29 ENCOUNTER — Other Ambulatory Visit: Payer: Self-pay | Admitting: Cardiovascular Disease

## 2016-04-15 ENCOUNTER — Encounter: Payer: Self-pay | Admitting: Internal Medicine

## 2016-04-15 ENCOUNTER — Ambulatory Visit (INDEPENDENT_AMBULATORY_CARE_PROVIDER_SITE_OTHER): Payer: Medicare Other | Admitting: Internal Medicine

## 2016-04-15 VITALS — BP 132/70 | HR 66 | Ht 64.0 in | Wt 214.2 lb

## 2016-04-15 DIAGNOSIS — I10 Essential (primary) hypertension: Secondary | ICD-10-CM | POA: Diagnosis not present

## 2016-04-15 DIAGNOSIS — I482 Chronic atrial fibrillation: Secondary | ICD-10-CM

## 2016-04-15 DIAGNOSIS — K5793 Diverticulitis of intestine, part unspecified, without perforation or abscess with bleeding: Secondary | ICD-10-CM | POA: Diagnosis not present

## 2016-04-15 DIAGNOSIS — E669 Obesity, unspecified: Secondary | ICD-10-CM

## 2016-04-15 DIAGNOSIS — I4821 Permanent atrial fibrillation: Secondary | ICD-10-CM

## 2016-04-15 NOTE — Patient Instructions (Signed)
Medication Instructions:  Your physician recommends that you continue on your current medications as directed. Please refer to the Current Medication list given to you today.   Labwork: None ordered   Testing/Procedures: None ordered   Follow-Up: Amber will call after speaking to your GI physician  Any Other Special Instructions Will Be Listed Below (If Applicable).     If you need a refill on your cardiac medications before your next appointment, please call your pharmacy.

## 2016-04-15 NOTE — Progress Notes (Signed)
Watchman Consult Note   Date:  04/15/2016   ID:  [name], DOB 1935-03-30, MRN [id number]  PCP:  Kristie Cowman, MD  Cardiologist:  Mariah Milling Referring Physician: Mariah Milling   CC: to discuss Watchman implant    History of Present Illness: [name] is a 81 y.o. female who presents today for evaluation of left atrial appendage occluder.  She has permanent atrial fibrillation as well as diabetes, obesity, hypertension, and hyperlipidemia.  She was previously on Eliquis but developed a GI bleed in April of 2015.  At that time, she was found to have gastritis, diverticulosis, and polyps requiring removal.  She then had recurrent GI bleeding off OAC in May of 2016.  Her GI physicians have advised to not be on OAC going forward. The patient has been evaluated by their referring physician and is felt to be a poor candidate for long term OAC due to GI bleeding requiring transfusion.  She therefore presents today for Watchman evaluation.   Today, she denies symptoms of palpitations, chest pain, shortness of breath, orthopnea, PND, lower extremity edema, claudication, dizziness, presyncope, syncope, bleeding, or neurologic sequela. The patient is tolerating medications without difficulties and is otherwise without complaint today.    Past Medical History  Diagnosis Date  . Personal history of colonic polyps   . Chronic a-fib (HCC)     a. CHA2DS2VASc = 5 (HTN, age > 12, DM2, female);  b. Eliquis d/c'd 4/015 in setting of BRBPR/GIB;  c. Rate-control w/ BB.  . Type II or unspecified type diabetes mellitus without mention of complication, not stated as uncontrolled   . Obesity, unspecified   . Hyperlipidemia   . HTN (hypertension)   . Cellulitis and abscess of leg, except foot   . Plantar fascial fibromatosis   . Diverticulosis   . RBBB   . GIB (gastrointestinal bleeding)     a. 02/2014 BRBPR;  b. 02/2014 Colonoscopy/EGD: diverticulosis with polys/polypectomy - ascending colon, Gastritis on EDG.    . Gastritis     a. 02/2014 EGD.  Marland Kitchen Anemia     a. 02/2014 in setting of GIB req 3U PRBC's.   Past Surgical History  Procedure Laterality Date  . No past surgeries    . Peripheral vascular catheterization N/A 03/15/2015    Procedure: Visceral Angiography;  Surgeon: Annice Needy, MD;  Location: [facility] INVASIVE CV LAB;  Service: Cardiovascular;  Laterality: N/A;  . Embolization N/A 03/15/2015    Procedure: Embolization;  Surgeon: Annice Needy, MD;  Location: [facility] INVASIVE CV LAB;  Service: Cardiovascular;  Laterality: N/A;     Current Outpatient Prescriptions  Medication Sig Dispense Refill  . albuterol (PROVENTIL HFA) 108 (90 BASE) MCG/ACT inhaler Inhale 2 puffs into the lungs every 6 (six) hours as needed for wheezing or shortness of breath.     . doxazosin (CARDURA) 4 MG tablet TAKE 1 TABLET BY MOUTH DAILY (PLEASE CALL AND SCHEDULE A SIX MONTH FOLLOW UP APPOINTMENT)    . ferrous sulfate (FERROUSUL) 325 (65 FE) MG tablet Take 1 tablet (325 mg total) by mouth 2 (two) times daily with a meal. 60 tablet 3  . fluticasone (FLONASE) 50 MCG/ACT nasal spray Place 2 sprays into the nose 2 (two) times daily.     . Fluticasone-Salmeterol (ADVAIR DISKUS) 250-50 MCG/DOSE AEPB Inhale into the lungs as directed.    . furosemide (LASIX) 40 MG tablet Take 1 tablet by mouth 2 (two) times daily as needed. swelling    . Magnesium  250 MG TABS Take 1-2 tablets by mouth daily.  3  . metoprolol (LOPRESSOR) 100 MG tablet Take 100 mg by mouth 2 (two) times daily.    . potassium chloride (K-DUR) 10 MEQ tablet Take 1 tablet (10 mEq total) by mouth daily. 30 tablet 0   No current facility-administered medications for this visit.    Allergies:   Diltiazem hcl   Social History:  The patient  reports that she has never smoked. She has never used smokeless tobacco. She reports that she does not drink alcohol or use illicit drugs.   Family History:  The patient's family history includes Asthma in her father; Emphysema in her  father; Heart attack in her father; Hypertension in her mother.    ROS:  Please see the history of present illness.   All other systems are reviewed and negative.    PHYSICAL EXAM: VS:  BP 132/70 mmHg  Pulse 66  Ht 5\' 4"  (1.626 m)  Wt 214 lb 3.2 oz (97.16 kg)  BMI 36.75 kg/m2 , BMI Body mass index is 36.75 kg/(m^2). GEN: Obese, well nourished, well developed, in no acute distress HEENT: normal Neck: no JVD, carotid bruits, or masses Cardiac: iRRR; no murmurs, rubs, or gallops, 1+BLE edema, +bilateral compression hose Respiratory:  clear to auscultation bilaterally, normal work of breathing GI: soft, nontender, nondistended, + BS MS: no deformity or atrophy Skin: warm and dry  Neuro:  Strength and sensation are intact Psych: euthymic mood, full affect  EKG:  EKG is ordered today. EKG today demonstrates atrial fibrillation, rate 66, RBBB  Recent Labs: 04/27/2015: Hemoglobin 11.2*; Magnesium 2.2; Platelets 265    Wt Readings from Last 3 Encounters:  04/15/16 214 lb 3.2 oz (97.16 kg)  02/29/16 210 lb 5 oz (95.397 kg)  04/27/15 202 lb 8 oz (91.853 kg)      Other studies Reviewed: Additional studies/ records that were reviewed today include: Dr Windell Hummingbird office notes  ASSESSMENT AND PLAN:  1.  Permanent atrial fibrillation I have seen [name] is a 81 y.o. female in the office today who has been referred by Dr Mariah Milling for a Watchman left atrial appendage closure device.  She has a history of permanent atrial fibrillation.  This patients CHA2DS2-VASc Score and unadjusted Ischemic Stroke Rate (% per year) is equal to 7.2 % stroke rate/year from a score of 5 which necessitates long term oral anticoagulation to prevent stroke. HasBled score is 4.  Modified Rankin Score is 0. Unfortunately, she is not felt to be a long term Warfarin candidate secondary to prior GI bleeding requiring transfusion.  With recurrent GI bleeding off anticoagulation, we would need to verify with her GI  physicians that the post op medication regimen required for Watchman would be suitable. We will contact them and follow up with the patient by telephone.    Procedural risks for the Watchman implant have been reviewed with the patient including a 1% risk of stroke, 2% risk of perforation, 0.1% risk of device embolization.  Given the patient's poor candidacy for long-term oral anticoagulation, and potential ability to tolerate short term oral anticoagulation, I have recommended the watchman left atrial appendage closure system pending discussion with her GI physicians.  The patient understands that the ability to implant Watchman is dependent on results of the TEE.    2.  HTN Stable No change required today  3.  Obesity Weight loss encouraged    Follow-up:  With EP NP by phone   Current medicines  are reviewed at length with the patient today.   The patient does not have concerns regarding her medicines.  The following changes were made today:  none  Labs/ tests ordered today include: none    Signed, Hillis RangeJames Riddik Senna, MD   04/15/2016 10:57 AM     [facility] [address] [place] [address] [phone] (office) [phone] (fax)

## 2016-04-24 ENCOUNTER — Telehealth: Payer: Self-pay | Admitting: Nurse Practitioner

## 2016-04-24 NOTE — Telephone Encounter (Signed)
Dr Elliott's office called back today (pt's GI physician).  He is ok with short term anticoagulation for patient if she would like to pursue Watchman implant. I attempted to call patient but was unable to reach her. Will ask Dory HornSherri Price, RN to try again later this week as I am out until Monday.   Gypsy BalsamAmber Heather Mckendree, NP 04/24/2016 5:14 PM

## 2016-04-25 NOTE — Telephone Encounter (Signed)
Informed patient that GI is ok with short term anticoagulation.  She would like Tresa EndoKelly, RN to call her to discuss this further. She understands Tresa EndoKelly is not in the office today and will call her tomorrow.

## 2016-04-26 NOTE — Telephone Encounter (Signed)
Returned call to patient and she is very anxious about procedure and taking anticoagulation. She is "surprised" Dr Mechele CollinElliott agreed it is okay.  She would like to speak with Amber to ask a few more questions.  I let her know it would be next week before she heard from her.  She said her heart "ponding all night" just thinking about having the procedure.  I tried to reassure her but she still wants to talk to Gypsy BalsamAmber Seiler, NP

## 2016-04-29 NOTE — Telephone Encounter (Signed)
Spoke with patient. Aware of Dr Elliott's recommendations. She would like for me to call her back next Monday to discuss further.  Julie BalsamAmber Seiler, NP 04/29/2016 5:13 PM

## 2016-05-06 NOTE — Telephone Encounter (Addendum)
Spoke with patient again. She is leaning towards Watchman, but would like more time to think about it. I have encouraged her to have screening TEE to have more information to help make a decision. She would like to discuss with her close friends more. I will call her back the week of 7/10 to follow up at her request.  Gypsy BalsamAmber Avalee Castrellon, NP 05/06/2016 9:17 AM

## 2016-05-23 NOTE — Telephone Encounter (Signed)
Spoke with patient again. She is leaning towards Watchman but would like to talk with her friend who is in [place] right now.  She will call us back when she is ready to proceed. Will need screening TEE and then Watchman scheduled. TEE needs to be done with specific physicians.  Please forward to either me or Tresa EndoKelly to schedule.    Gypsy BalsamAmber Mamta Rimmer, NP 05/23/2016 4:15 PM

## 2016-06-03 ENCOUNTER — Telehealth: Payer: Self-pay | Admitting: Nurse Practitioner

## 2016-06-03 NOTE — Telephone Encounter (Signed)
Pt called back and would like to pursue Watchman.  I have offered her this Thursday 7/27 or 9/7. She is going to speak with her transportation and call back to let me know which she prefers.  If she wants to do 7/27, will do TEE on the table prior to implant. If she would like 9/7, would plan screening TEE in advance.  Gypsy Balsam, NP 06/03/2016 4:31 PM

## 2016-06-03 NOTE — Telephone Encounter (Signed)
Spoke with patient. She had recent dental implant and her dentist would like to avoid potential damage to implant for a couple of more weeks. Will plan Watchman 9/7. She has follow up with her dentist 8/15 to ensure this timing will be ok. She will call me back after that visit.   Gypsy Balsam, NP 06/03/2016 5:05 PM

## 2016-06-18 ENCOUNTER — Telehealth: Payer: Self-pay | Admitting: Nurse Practitioner

## 2016-06-18 ENCOUNTER — Encounter: Payer: Self-pay | Admitting: Nurse Practitioner

## 2016-06-18 NOTE — Telephone Encounter (Signed)
Spoke with patient and discussed plan for Watchman again.  Will plan TEE for 8/29 at 11AM with Dr Jens Somrenshaw. Instructions reviewed.  Letter also mailed. Will draw pre-Watchman labs that day as well.  Gypsy BalsamAmber Seiler, NP 06/18/2016 2:24 PM

## 2016-06-28 ENCOUNTER — Encounter (HOSPITAL_COMMUNITY): Payer: Self-pay

## 2016-07-09 ENCOUNTER — Ambulatory Visit (HOSPITAL_BASED_OUTPATIENT_CLINIC_OR_DEPARTMENT_OTHER): Payer: Medicare Other

## 2016-07-09 ENCOUNTER — Encounter (HOSPITAL_COMMUNITY)
Admission: RE | Disposition: A | Discharge status: Home / Self Care | Payer: Self-pay | Source: Ambulatory Visit | Attending: Cardiology

## 2016-07-09 ENCOUNTER — Ambulatory Visit (HOSPITAL_COMMUNITY)
Admission: RE | Admit: 2016-07-09 | Discharge: 2016-07-09 | Disposition: A | Discharge status: Home / Self Care | Payer: Medicare Other | Source: Ambulatory Visit | Attending: Cardiology | Admitting: Cardiology

## 2016-07-09 ENCOUNTER — Encounter (HOSPITAL_COMMUNITY): Payer: Self-pay

## 2016-07-09 DIAGNOSIS — Q211 Atrial septal defect: Secondary | ICD-10-CM | POA: Insufficient documentation

## 2016-07-09 DIAGNOSIS — Z7951 Long term (current) use of inhaled steroids: Secondary | ICD-10-CM | POA: Diagnosis not present

## 2016-07-09 DIAGNOSIS — Z79899 Other long term (current) drug therapy: Secondary | ICD-10-CM | POA: Insufficient documentation

## 2016-07-09 DIAGNOSIS — E669 Obesity, unspecified: Secondary | ICD-10-CM | POA: Insufficient documentation

## 2016-07-09 DIAGNOSIS — D649 Anemia, unspecified: Secondary | ICD-10-CM | POA: Diagnosis not present

## 2016-07-09 DIAGNOSIS — E119 Type 2 diabetes mellitus without complications: Secondary | ICD-10-CM | POA: Diagnosis not present

## 2016-07-09 DIAGNOSIS — Z6836 Body mass index (BMI) 36.0-36.9, adult: Secondary | ICD-10-CM | POA: Insufficient documentation

## 2016-07-09 DIAGNOSIS — I340 Nonrheumatic mitral (valve) insufficiency: Secondary | ICD-10-CM | POA: Diagnosis not present

## 2016-07-09 DIAGNOSIS — I10 Essential (primary) hypertension: Secondary | ICD-10-CM | POA: Diagnosis not present

## 2016-07-09 DIAGNOSIS — I482 Chronic atrial fibrillation: Secondary | ICD-10-CM | POA: Diagnosis not present

## 2016-07-09 DIAGNOSIS — I4891 Unspecified atrial fibrillation: Secondary | ICD-10-CM | POA: Diagnosis present

## 2016-07-09 HISTORY — PX: TEE WITHOUT CARDIOVERSION: SHX5443

## 2016-07-09 LAB — CBC
HEMATOCRIT: 38.2 % (ref 36.0–46.0)
HEMOGLOBIN: 12.3 g/dL (ref 12.0–15.0)
MCH: 32.8 pg (ref 26.0–34.0)
MCHC: 32.2 g/dL (ref 30.0–36.0)
MCV: 101.9 fL — ABNORMAL HIGH (ref 78.0–100.0)
Platelets: 260 10*3/uL (ref 150–400)
RBC: 3.75 MIL/uL — AB (ref 3.87–5.11)
RDW: 17.2 % — ABNORMAL HIGH (ref 11.5–15.5)
WBC: 5.2 10*3/uL (ref 4.0–10.5)

## 2016-07-09 LAB — BASIC METABOLIC PANEL
ANION GAP: 8 (ref 5–15)
BUN: 14 mg/dL (ref 6–20)
CALCIUM: 9.4 mg/dL (ref 8.9–10.3)
CHLORIDE: 107 mmol/L (ref 101–111)
CO2: 25 mmol/L (ref 22–32)
Creatinine, Ser: 0.83 mg/dL (ref 0.44–1.00)
GFR calc non Af Amer: >60 mL/min (ref >60)
GLUCOSE: 101 mg/dL — AB (ref 65–99)
POTASSIUM: 3.6 mmol/L (ref 3.5–5.1)
Sodium: 140 mmol/L (ref 135–145)

## 2016-07-09 LAB — GLUCOSE, CAPILLARY: GLUCOSE-CAPILLARY: 92 mg/dL (ref 65–99)

## 2016-07-09 SURGERY — ECHOCARDIOGRAM, TRANSESOPHAGEAL
Anesthesia: Moderate Sedation

## 2016-07-09 MED ORDER — DIPHENHYDRAMINE HCL 50 MG/ML IJ SOLN
INTRAMUSCULAR | Status: AC
  Filled 2016-07-09: qty 1

## 2016-07-09 MED ORDER — BUTAMBEN-TETRACAINE-BENZOCAINE 2-2-14 % EX AERO
INHALATION_SPRAY | CUTANEOUS | Status: DC | PRN
Start: 2016-07-09 — End: 2016-07-09
  Administered 2016-07-09: 2 via TOPICAL

## 2016-07-09 MED ORDER — SODIUM CHLORIDE 0.9 % IV SOLN: INTRAVENOUS | Status: DC

## 2016-07-09 MED ORDER — MIDAZOLAM HCL 10 MG/2ML IJ SOLN
INTRAMUSCULAR | Status: DC | PRN
  Administered 2016-07-09: 1 mg via INTRAVENOUS
  Administered 2016-07-09: 2 mg via INTRAVENOUS

## 2016-07-09 MED ORDER — FENTANYL CITRATE (PF) 100 MCG/2ML IJ SOLN
INTRAMUSCULAR | Status: AC
  Filled 2016-07-09: qty 2

## 2016-07-09 MED ORDER — FENTANYL CITRATE (PF) 100 MCG/2ML IJ SOLN
INTRAMUSCULAR | Status: DC | PRN
  Administered 2016-07-09: 25 ug via INTRAVENOUS

## 2016-07-09 MED ORDER — MIDAZOLAM HCL 5 MG/ML IJ SOLN
INTRAMUSCULAR | Status: AC
  Filled 2016-07-09: qty 2

## 2016-07-09 NOTE — CV Procedure (Signed)
See full TEE report in [employer] Pt sedated with versed 3 mg and fentanyl 25 micrograms.  [name]

## 2016-07-09 NOTE — H&P (Signed)
04/15/2016 10:15 AM  Office Visit  MRN:  [id number]  Description: Female DOB: [birth year] Provider: Hillis RangeJames Allred, MD Department: [address] [facility]  Vitals   BP  132/70   Pulse  66   Ht  5\' 4"  (1.626 m)   Wt  214 lb 3.2 oz (97.2 kg)   BMI  36.77 kg/m    Progress Notes   Hillis RangeJames Allred, MD at 04/15/2016 8:59 AM   Status: Signed       Watchman Consult Note   Date:  04/15/2016   ID:  Vernie AmmonsJoanne S Mcphee, DOB [birth year], MRN [id number]  PCP:  Kristie CowmanSCHOOLER, KAREN, MD                  Cardiologist:  Mariah MillingGollan Referring Physician: Mariah MillingGollan             CC: to discuss Watchman implant    History of Present Illness: Vernie AmmonsJoanne S Manon is a 81 y.o. female who presents today for evaluation of left atrial appendage occluder.  She has permanent atrial fibrillation as well as diabetes, obesity, hypertension, and hyperlipidemia.  She was previously on Eliquis but developed a GI bleed in April of 2015.  At that time, she was found to have gastritis, diverticulosis, and polyps requiring removal.  She then had recurrent GI bleeding off OAC in May of 2016.  Her GI physicians have advised to not be on OAC going forward. The patient has been evaluated by their referring physician and is felt to be a poor candidate for long term OAC due to GI bleeding requiring transfusion.  She therefore presents today for Watchman evaluation.   Today, she denies symptoms of palpitations, chest pain, shortness of breath, orthopnea, PND, lower extremity edema, claudication, dizziness, presyncope, syncope, bleeding, or neurologic sequela. The patient is tolerating medications without difficulties and is otherwise without complaint today.         Past Medical History  Diagnosis Date  . Personal history of colonic polyps   . Chronic a-fib (HCC)     a. CHA2DS2VASc = 5 (HTN, age > 2475, DM2, female);  b. Eliquis d/c'd 4/015 in setting of BRBPR/GIB;  c. Rate-control w/ BB.  . Type II or unspecified type diabetes mellitus without  mention of complication, not stated as uncontrolled   . Obesity, unspecified   . Hyperlipidemia   . HTN (hypertension)   . Cellulitis and abscess of leg, except foot   . Plantar fascial fibromatosis   . Diverticulosis   . RBBB   . GIB (gastrointestinal bleeding)     a. 02/2014 BRBPR;  b. 02/2014 Colonoscopy/EGD: diverticulosis with polys/polypectomy - ascending colon, Gastritis on EDG.  . Gastritis     a. 02/2014 EGD.  Marland Kitchen. Anemia     a. 02/2014 in setting of GIB req 3U PRBC's.         Past Surgical History  Procedure Laterality Date  . No past surgeries    . Peripheral vascular catheterization N/A 03/15/2015    Procedure: Visceral Angiography;  Surgeon: Annice NeedyJason S Dew, MD;  Location: [facility] INVASIVE CV LAB;  Service: Cardiovascular;  Laterality: N/A;  . Embolization N/A 03/15/2015    Procedure: Embolization;  Surgeon: Annice NeedyJason S Dew, MD;  Location: [facility] INVASIVE CV LAB;  Service: Cardiovascular;  Laterality: N/A;           Current Outpatient Prescriptions  Medication Sig Dispense Refill  . albuterol (PROVENTIL HFA) 108 (90 BASE) MCG/ACT inhaler Inhale 2 puffs into the lungs every 6 (six) hours as  needed for wheezing or shortness of breath.     . doxazosin (CARDURA) 4 MG tablet TAKE 1 TABLET BY MOUTH DAILY (PLEASE CALL AND SCHEDULE A SIX MONTH FOLLOW UP APPOINTMENT)    . ferrous sulfate (FERROUSUL) 325 (65 FE) MG tablet Take 1 tablet (325 mg total) by mouth 2 (two) times daily with a meal. 60 tablet 3  . fluticasone (FLONASE) 50 MCG/ACT nasal spray Place 2 sprays into the nose 2 (two) times daily.     . Fluticasone-Salmeterol (ADVAIR DISKUS) 250-50 MCG/DOSE AEPB Inhale into the lungs as directed.    . furosemide (LASIX) 40 MG tablet Take 1 tablet by mouth 2 (two) times daily as needed. swelling    . Magnesium 250 MG TABS Take 1-2 tablets by mouth daily.  3  . metoprolol (LOPRESSOR) 100 MG tablet Take 100 mg by mouth 2 (two) times daily.    . potassium  chloride (K-DUR) 10 MEQ tablet Take 1 tablet (10 mEq total) by mouth daily. 30 tablet 0   No current facility-administered medications for this visit.    Allergies:   Diltiazem hcl   Social History:  The patient  reports that she has never smoked. She has never used smokeless tobacco. She reports that she does not drink alcohol or use illicit drugs.   Family History:  The patient's family history includes Asthma in her father; Emphysema in her father; Heart attack in her father; Hypertension in her mother.    ROS:  Please see the history of present illness.   All other systems are reviewed and negative.    PHYSICAL EXAM: VS:  BP 132/70 mmHg  Pulse 66  Ht 5\' 4"  (1.626 m)  Wt 214 lb 3.2 oz (97.16 kg)  BMI 36.75 kg/m2 , BMI Body mass index is 36.75 kg/(m^2). GEN: Obese, well nourished, well developed, in no acute distress  HEENT: normal  Neck: no JVD, carotid bruits, or masses Cardiac: iRRR; no murmurs, rubs, or gallops, 1+BLE edema, +bilateral compression hose Respiratory:  clear to auscultation bilaterally, normal work of breathing GI: soft, nontender, nondistended, + BS MS: no deformity or atrophy  Skin: warm and dry  Neuro:  Strength and sensation are intact Psych: euthymic mood, full affect  EKG:  EKG is ordered today. EKG today demonstrates atrial fibrillation, rate 66, RBBB  Recent Labs: 04/27/2015: Hemoglobin 11.2*; Magnesium 2.2; Platelets 265       Wt Readings from Last 3 Encounters:  04/15/16 214 lb 3.2 oz (97.16 kg)  02/29/16 210 lb 5 oz (95.397 kg)  04/27/15 202 lb 8 oz (91.853 kg)      Other studies Reviewed: Additional studies/ records that were reviewed today include: Dr Windell Hummingbird office notes  ASSESSMENT AND PLAN:  1.  Permanent atrial fibrillation I have seen FLOWER FRANKO is a 81 y.o. female in the office today who has been referred by Dr Mariah Milling for a Watchman left atrial appendage closure device.  She has a history of permanent  atrial fibrillation.  This patients CHA2DS2-VASc Score and unadjusted Ischemic Stroke Rate (% per year) is equal to 7.2 % stroke rate/year from a score of 5 which necessitates long term oral anticoagulation to prevent stroke. HasBled score is 4.  Modified Rankin Score is 0. Unfortunately, she is not felt to be a long term Warfarin candidate secondary to prior GI bleeding requiring transfusion.  With recurrent GI bleeding off anticoagulation, we would need to verify with her GI physicians that the post op medication regimen required for  Watchman would be suitable. We will contact them and follow up with the patient by telephone.    Procedural risks for the Watchman implant have been reviewed with the patient including a 1% risk of stroke, 2% risk of perforation, 0.1% risk of device embolization.  Given the patient's poor candidacy for long-term oral anticoagulation, and potential ability to tolerate short term oral anticoagulation, I have recommended the watchman left atrial appendage closure system pending discussion with her GI physicians.  The patient understands that the ability to implant Watchman is dependent on results of the TEE.    2.  HTN Stable No change required today  3.  Obesity Weight loss encouraged    Follow-up:  With EP NP by phone   Current medicines are reviewed at length with the patient today.   The patient does not have concerns regarding her medicines.  The following changes were made today:  none  Labs/ tests ordered today include: none    Signed, Hillis Range, MD   04/15/2016 10:57 AM     [facility] [address] [place] [address] [phone] (office) [phone] (fax)     For TEE; no changes Olga Millers

## 2016-07-09 NOTE — Progress Notes (Signed)
  Echocardiogram Echocardiogram Transesophageal has been performed.  Nolon RodBrown, Tony 07/09/2016, 12:05 PM

## 2016-07-09 NOTE — Interval H&P Note (Signed)
History and Physical Interval Note:  07/09/2016 10:41 AM  [name]  has presented today for surgery, with the diagnosis of afib  The various methods of treatment have been discussed with the patient and family. After consideration of risks, benefits and other options for treatment, the patient has consented to  Procedure(s): TRANSESOPHAGEAL ECHOCARDIOGRAM (TEE) (N/A) as a surgical intervention .  The patient's history has been reviewed, patient examined, no change in status, stable for surgery.  I have reviewed the patient's chart and labs.  Questions were answered to the patient's satisfaction.     Olga MillersBrian Crenshaw

## 2016-07-09 NOTE — Discharge Instructions (Signed)
Moderate Conscious Sedation, Adult °Sedation is the use of medicines to promote relaxation and relieve discomfort and anxiety. Moderate conscious sedation is a type of sedation. Under moderate conscious sedation you are less alert than normal but are still able to respond to instructions or stimulation. Moderate conscious sedation is used during short medical and dental procedures. It is milder than deep sedation or general anesthesia and allows you to return to your regular activities sooner. °LET YOUR HEALTH CARE PROVIDER KNOW ABOUT:  °· Any allergies you have. °· All medicines you are taking, including vitamins, herbs, eye drops, creams, and over-the-counter medicines. °· Use of steroids (by mouth or creams). °· Previous problems you or members of your family have had with the use of anesthetics. °· Any blood disorders you have. °· Previous surgeries you have had. °· Medical conditions you have. °· Possibility of pregnancy, if this applies. °· Use of cigarettes, alcohol, or illegal drugs. °RISKS AND COMPLICATIONS °Generally, this is a safe procedure. However, as with any procedure, problems can occur. Possible problems include: °· Oversedation. °· Trouble breathing on your own. You may need to have a breathing tube until you are awake and breathing on your own. °· Allergic reaction to any of the medicines used for the procedure. °BEFORE THE PROCEDURE °· You may have blood tests done. These tests can help show how well your kidneys and liver are working. They can also show how well your blood clots. °· A physical exam will be done.   °· Only take medicines as directed by your health care provider. You may need to stop taking medicines (such as blood thinners, aspirin, or nonsteroidal anti-inflammatory drugs) before the procedure.   °· Do not eat or drink at least 6 hours before the procedure or as directed by your health care provider. °· Arrange for a responsible adult, family member, or friend to take you home  after the procedure. He or she should stay with you for at least 24 hours after the procedure, until the medicine has worn off. °PROCEDURE  °· An intravenous (IV) catheter will be inserted into one of your veins. Medicine will be able to flow directly into your body through this catheter. You may be given medicine through this tube to help prevent pain and help you relax. °· The medical or dental procedure will be done. °AFTER THE PROCEDURE °· You will stay in a recovery area until the medicine has worn off. Your blood pressure and pulse will be checked.   °·  Depending on the procedure you had, you may be allowed to go home when you can tolerate liquids and your pain is under control. °  °This information is not intended to replace advice given to you by your health care provider. Make sure you discuss any questions you have with your health care provider. °  °Document Released: 07/23/2001 Document Revised: 11/18/2014 Document Reviewed: 07/05/2013 °Elsevier Interactive Patient Education ©2016 [employer]vier Inc. ° °

## 2016-07-10 ENCOUNTER — Telehealth: Payer: Self-pay | Admitting: Nurse Practitioner

## 2016-07-10 MED ORDER — APIXABAN 5 MG PO TABS: 5.0000 mg | ORAL_TABLET | Freq: Two times a day (BID) | ORAL | 1 refills | Status: DC

## 2016-07-10 NOTE — Telephone Encounter (Signed)
Spoke with patient. TEE measurements suitable for Watchman. She does have low flow through her appendage. Discussed with Dr Johney FrameAllred. Will start Eliquis now and re-evaluate with TEE next week prior to procedure.  Hgb stable on labs yesterday. K a little low, she has already taken 20meq of K this morning.   [place] [facility] [place].  Questions answered.  Gypsy BalsamAmber Seiler, NP 07/10/2016 2:35 PM

## 2016-07-16 ENCOUNTER — Telehealth: Payer: Self-pay | Admitting: Internal Medicine

## 2016-07-16 NOTE — Telephone Encounter (Signed)
This is an inpatient only procedure per CMS  Gypsy BalsamAmber Kaiven Vester, NP 07/16/2016 3:24 PM

## 2016-07-16 NOTE — Telephone Encounter (Signed)
Left message for [name] with below info

## 2016-07-16 NOTE — Telephone Encounter (Signed)
New message    [name] verbalized that Is calling because the wants to verify if the order for the procedure with Dr.Allred is written correctly, it is written as an inpatient procedure instead of outpatient.  The error in the system is giving to her states that the procedure is not allowed to be as cheduled as an inpatient procedure

## 2016-07-17 NOTE — Anesthesia Preprocedure Evaluation (Addendum)
Anesthesia Evaluation  Patient identified by MRN, date of birth, ID band Patient awake    Reviewed: Allergy & Precautions, H&P , NPO status , Patient's Chart, lab work & pertinent test results, reviewed documented beta blocker date and time   Airway Mallampati: II  TM Distance: >3 FB Neck ROM: Full    Dental no notable dental hx. (+) Teeth Intact, Dental Advisory Given   Pulmonary neg pulmonary ROS,    Pulmonary exam normal breath sounds clear to auscultation       Cardiovascular hypertension, Pt. on medications and Pt. on home beta blockers + dysrhythmias Atrial Fibrillation + Valvular Problems/Murmurs  Rhythm:Irregular Rate:Normal     Neuro/Psych negative neurological ROS  negative psych ROS   GI/Hepatic negative GI ROS, Neg liver ROS,   Endo/Other  diabetesMorbid obesity  Renal/GU negative Renal ROS  negative genitourinary   Musculoskeletal   Abdominal   Peds  Hematology negative hematology ROS (+)   Anesthesia Other Findings   Reproductive/Obstetrics negative OB ROS                            Anesthesia Physical Anesthesia Plan  ASA: III  Anesthesia Plan: General   Post-op Pain Management:    Induction: Intravenous  Airway Management Planned: Oral ETT  Additional Equipment: Arterial line  Intra-op Plan:   Post-operative Plan: Extubation in OR  Informed Consent: I have reviewed the patients History and Physical, chart, labs and discussed the procedure including the risks, benefits and alternatives for the proposed anesthesia with the patient or authorized representative who has indicated his/her understanding and acceptance.   Dental advisory given  Plan Discussed with: CRNA  Anesthesia Plan Comments:         Anesthesia Quick Evaluation

## 2016-07-18 ENCOUNTER — Inpatient Hospital Stay (HOSPITAL_COMMUNITY): Payer: Medicare Other | Admitting: Anesthesiology

## 2016-07-18 ENCOUNTER — Other Ambulatory Visit: Payer: Self-pay

## 2016-07-18 ENCOUNTER — Encounter (HOSPITAL_COMMUNITY)
Admission: RE | Disposition: A | Discharge status: Home / Self Care | Payer: Self-pay | Source: Ambulatory Visit | Attending: Internal Medicine

## 2016-07-18 ENCOUNTER — Inpatient Hospital Stay (HOSPITAL_BASED_OUTPATIENT_CLINIC_OR_DEPARTMENT_OTHER): Payer: Medicare Other

## 2016-07-18 ENCOUNTER — Encounter (HOSPITAL_COMMUNITY): Payer: Self-pay | Admitting: *Deleted

## 2016-07-18 ENCOUNTER — Ambulatory Visit (HOSPITAL_COMMUNITY)
Admission: RE | Admit: 2016-07-18 | Discharge: 2016-07-18 | Disposition: A | Discharge status: Home / Self Care | Payer: Medicare Other | Source: Ambulatory Visit | Attending: Internal Medicine | Admitting: Internal Medicine

## 2016-07-18 DIAGNOSIS — I482 Chronic atrial fibrillation: Secondary | ICD-10-CM | POA: Insufficient documentation

## 2016-07-18 DIAGNOSIS — E669 Obesity, unspecified: Secondary | ICD-10-CM | POA: Diagnosis not present

## 2016-07-18 DIAGNOSIS — D649 Anemia, unspecified: Secondary | ICD-10-CM | POA: Insufficient documentation

## 2016-07-18 DIAGNOSIS — E785 Hyperlipidemia, unspecified: Secondary | ICD-10-CM | POA: Diagnosis not present

## 2016-07-18 DIAGNOSIS — I4510 Unspecified right bundle-branch block: Secondary | ICD-10-CM | POA: Diagnosis not present

## 2016-07-18 DIAGNOSIS — E119 Type 2 diabetes mellitus without complications: Secondary | ICD-10-CM | POA: Insufficient documentation

## 2016-07-18 DIAGNOSIS — I340 Nonrheumatic mitral (valve) insufficiency: Secondary | ICD-10-CM | POA: Diagnosis not present

## 2016-07-18 DIAGNOSIS — I513 Intracardiac thrombosis, not elsewhere classified: Secondary | ICD-10-CM | POA: Diagnosis not present

## 2016-07-18 DIAGNOSIS — I270 Primary pulmonary hypertension: Secondary | ICD-10-CM | POA: Insufficient documentation

## 2016-07-18 DIAGNOSIS — R011 Cardiac murmur, unspecified: Secondary | ICD-10-CM | POA: Insufficient documentation

## 2016-07-18 DIAGNOSIS — Z6836 Body mass index (BMI) 36.0-36.9, adult: Secondary | ICD-10-CM | POA: Diagnosis not present

## 2016-07-18 DIAGNOSIS — Z86010 Personal history of colonic polyps: Secondary | ICD-10-CM | POA: Insufficient documentation

## 2016-07-18 DIAGNOSIS — K5790 Diverticulosis of intestine, part unspecified, without perforation or abscess without bleeding: Secondary | ICD-10-CM | POA: Diagnosis not present

## 2016-07-18 DIAGNOSIS — Z539 Procedure and treatment not carried out, unspecified reason: Secondary | ICD-10-CM | POA: Diagnosis not present

## 2016-07-18 DIAGNOSIS — Z7951 Long term (current) use of inhaled steroids: Secondary | ICD-10-CM | POA: Diagnosis not present

## 2016-07-18 DIAGNOSIS — I4891 Unspecified atrial fibrillation: Secondary | ICD-10-CM

## 2016-07-18 DIAGNOSIS — I509 Heart failure, unspecified: Secondary | ICD-10-CM | POA: Diagnosis not present

## 2016-07-18 DIAGNOSIS — I4821 Permanent atrial fibrillation: Secondary | ICD-10-CM

## 2016-07-18 HISTORY — PX: TEE WITHOUT CARDIOVERSION: SHX5443

## 2016-07-18 LAB — ABO/RH: ABO/RH(D): A POS

## 2016-07-18 LAB — TYPE AND SCREEN
ABO/RH(D): A POS
Antibody Screen: NEGATIVE

## 2016-07-18 LAB — GLUCOSE, CAPILLARY: Glucose-Capillary: 112 mg/dL — ABNORMAL HIGH (ref 65–99)

## 2016-07-18 SURGERY — ECHOCARDIOGRAM, TRANSESOPHAGEAL
Anesthesia: General

## 2016-07-18 MED ORDER — SODIUM CHLORIDE 0.9% FLUSH: 3.0000 mL | Freq: Two times a day (BID) | INTRAVENOUS | Status: DC

## 2016-07-18 MED ORDER — ROCURONIUM BROMIDE 100 MG/10ML IV SOLN
INTRAVENOUS | Status: DC | PRN
  Administered 2016-07-18: 50 mg via INTRAVENOUS

## 2016-07-18 MED ORDER — SODIUM CHLORIDE 0.9% FLUSH: 3.0000 mL | INTRAVENOUS | Status: DC | PRN

## 2016-07-18 MED ORDER — FENTANYL CITRATE (PF) 100 MCG/2ML IJ SOLN
INTRAMUSCULAR | Status: DC | PRN
  Administered 2016-07-18 (×2): 25 ug via INTRAVENOUS

## 2016-07-18 MED ORDER — LIDOCAINE HCL (CARDIAC) 20 MG/ML IV SOLN
INTRAVENOUS | Status: DC | PRN
  Administered 2016-07-18: 60 mg via INTRAVENOUS

## 2016-07-18 MED ORDER — PERFLUTREN LIPID MICROSPHERE
INTRAVENOUS | Status: AC
  Filled 2016-07-18: qty 10

## 2016-07-18 MED ORDER — LIDOCAINE HCL (PF) 1 % IJ SOLN
INTRAMUSCULAR | Status: AC
Start: 2016-07-18 — End: 2016-07-18
  Filled 2016-07-18: qty 30

## 2016-07-18 MED ORDER — ONDANSETRON HCL 4 MG/2ML IJ SOLN: 4.0000 mg | Freq: Four times a day (QID) | INTRAMUSCULAR | Status: DC | PRN

## 2016-07-18 MED ORDER — HEPARIN SODIUM (PORCINE) 1000 UNIT/ML IJ SOLN
INTRAMUSCULAR | Status: DC | PRN
  Administered 2016-07-18: 2000 [IU] via INTRAVENOUS

## 2016-07-18 MED ORDER — SODIUM CHLORIDE 0.9 % IV SOLN: 250.0000 mL | INTRAVENOUS | Status: DC | PRN

## 2016-07-18 MED ORDER — ACETAMINOPHEN 325 MG PO TABS
325.0000 mg | ORAL_TABLET | ORAL | Status: DC | PRN
  Filled 2016-07-18: qty 2

## 2016-07-18 MED ORDER — HEPARIN (PORCINE) IN NACL 2-0.9 UNIT/ML-% IJ SOLN
INTRAMUSCULAR | Status: AC
  Filled 2016-07-18: qty 500

## 2016-07-18 MED ORDER — CEFAZOLIN SODIUM-DEXTROSE 2-4 GM/100ML-% IV SOLN
INTRAVENOUS | Status: AC
  Filled 2016-07-18: qty 100

## 2016-07-18 MED ORDER — SODIUM CHLORIDE 0.45 % IV SOLN: INTRAVENOUS | Status: DC

## 2016-07-18 MED ORDER — APIXABAN 5 MG PO TABS
5.0000 mg | ORAL_TABLET | Freq: Once | ORAL | Status: AC
  Administered 2016-07-18: 5 mg via ORAL
  Filled 2016-07-18: qty 1

## 2016-07-18 MED ORDER — ONDANSETRON HCL 4 MG/2ML IJ SOLN
INTRAMUSCULAR | Status: DC | PRN
  Administered 2016-07-18: 4 mg via INTRAVENOUS

## 2016-07-18 MED ORDER — LACTATED RINGERS IV SOLN
INTRAVENOUS | Status: DC | PRN
Start: 2016-07-18 — End: 2016-07-18
  Administered 2016-07-18: 07:00:00 via INTRAVENOUS

## 2016-07-18 MED ORDER — SUGAMMADEX SODIUM 200 MG/2ML IV SOLN
INTRAVENOUS | Status: DC | PRN
  Administered 2016-07-18: 500 mg via INTRAVENOUS

## 2016-07-18 MED ORDER — SODIUM CHLORIDE 0.9 % IV SOLN: INTRAVENOUS | Status: DC

## 2016-07-18 MED ORDER — PROPOFOL 10 MG/ML IV BOLUS
INTRAVENOUS | Status: DC | PRN
  Administered 2016-07-18: 80 mg via INTRAVENOUS

## 2016-07-18 MED ORDER — HEPARIN SODIUM (PORCINE) 1000 UNIT/ML IJ SOLN
INTRAMUSCULAR | Status: AC
  Filled 2016-07-18: qty 1

## 2016-07-18 MED ORDER — HEPARIN (PORCINE) IN NACL 2-0.9 UNIT/ML-% IJ SOLN
INTRAMUSCULAR | Status: DC | PRN
Start: 2016-07-18 — End: 2016-07-18
  Administered 2016-07-18: 08:00:00

## 2016-07-18 MED ORDER — CEFAZOLIN SODIUM-DEXTROSE 2-4 GM/100ML-% IV SOLN
2.0000 g | INTRAVENOUS | Status: AC
  Administered 2016-07-18: 2 g via INTRAVENOUS

## 2016-07-18 MED ORDER — IOPAMIDOL (ISOVUE-370) INJECTION 76%
INTRAVENOUS | Status: AC
  Filled 2016-07-18: qty 100

## 2016-07-18 MED ORDER — DEXAMETHASONE SODIUM PHOSPHATE 4 MG/ML IJ SOLN
INTRAMUSCULAR | Status: DC | PRN
  Administered 2016-07-18: 5 mg via INTRAVENOUS

## 2016-07-18 SURGICAL SUPPLY — 9 items
BLANKET WARM UNDERBOD FULL ACC (MISCELLANEOUS) ×3 IMPLANT
CATH DIAG 6FR PIGTAIL (CATHETERS) ×3 IMPLANT
KIT HEART LEFT (KITS) ×3 IMPLANT
PACK CARDIAC CATHETERIZATION (CUSTOM PROCEDURE TRAY) ×3 IMPLANT
PAD DEFIB LIFELINK (PAD) ×3 IMPLANT
SHEATH INTRO CHECKFLO 16F 13 (SHEATH) ×2 IMPLANT
SHEATH INTRO CHECKFLO 16F 13CM (SHEATH) ×1
SHEATH PINNACLE 8F 10CM (SHEATH) ×3 IMPLANT
WIRE AMPLATZ WHISKJ .035X260CM (WIRE) ×3 IMPLANT

## 2016-07-18 NOTE — Discharge Instructions (Signed)
PATIENT INSTRUCTIONS  POST-ANESTHESIA    IMMEDIATELY FOLLOWING SURGERY:  Do not drive or operate machinery for the first twenty four hours after surgery.  Do not make any important decisions for twenty four hours after surgery or while taking narcotic pain medications or sedatives.  If you develop intractable nausea and vomiting or a severe headache please notify your doctor immediately.    FOLLOW-UP:  Please make an appointment with your surgeon as instructed. You do not need to follow up with anesthesia unless specifically instructed to do so.      QUESTIONS?:  Please feel free to call your physician or the hospital operator if you have any questions, and they will be happy to assist you.

## 2016-07-18 NOTE — Progress Notes (Signed)
TEE performed by Dr Delton SeeNelson to evaluate possible thrombus recently seen by Dr Jens Somrenshaw on TEE (refer to her note).  Pt confirmed to have LAA thrombus.  We feel that it is therefore prudent to delay consideration for watchman implant.  Will resume eliquis 5mg  BID.  Will evaluate LAA again in 4-6 weeks with either cardiac CT or TEE prior to rescheduling watchman procedure.  Hillis RangeJames Natesha Hassey MD, [facility] 07/18/2016 8:57 AM

## 2016-07-18 NOTE — Op Note (Signed)
     Transesophageal Echocardiogram Note  Julie AmmonsJoanne S [name] [id number] [birth year]  Procedure: Transesophageal Echocardiogram Indications: atrial fibrillation - Watchman procedure   Procedure Details Consent: Obtained Time Out: Verified patient identification, verified procedure, site/side was marked, verified correct patient position, special equipment/implants available, Radiology Safety Procedures followed,  medications/allergies/relevent history reviewed, required imaging and test results available.  Performed  Medications: General anesthesia by anesthesia staff  Left Ventrical:  LVEF 55-60% , no regional wall motion abnormalities  Mitral Valve: Mild MR  Aortic Valve: Mild AI  Tricuspid Valve: Severe TR  Pulmonic Valve: Trivial PR  Left Atrium/ Left atrial appendage: Severely dilated left atrium, bi-lobe LAA with decreased filling and emptying velocities. There is evidence of smoke and a thrombus in the tip of the anterior lobe measuring 1 x 0.7 cm.   Atrial septum: A PFO with left to right flow is present.   Aorta: Normal size of the aortic root and ascending aorta.   Complications: No apparent complications Patient did tolerate procedure well.  Watchman procedure was cancelled because of the presence of the thrombus in the left atrial appendage.  Julie AlexanderKatarina Enas Winchel, MD, [facility] Center[facility]ACC 07/18/2016, 10:47 AM

## 2016-07-18 NOTE — H&P (Signed)
ID:  [name], DOB 1935/10/27, MRN [id number]  PCP:  Julie Cowman, MD                  Cardiologist:  [name] Referring Physician: Mariah [name]             CC: afib   History of Present Illness: [name] is a 81 y.o. female who presents today for implantation of left atrial appendage occluder.  She has permanent atrial fibrillation as well as diabetes, obesity, hypertension, and hyperlipidemia.  She was previously on Eliquis but developed a GI bleed in April of 2015.  At that time, she was found to have gastritis, diverticulosis, and polyps requiring removal.  She then had recurrent GI bleeding off OAC in May of 2016.  Her GI physicians have advised to not be on OAC going forward. The patient has been evaluated by [name] and is felt to be a poor candidate for long term OAC due to GI bleeding requiring transfusion.  She therefore presents today for Watchman evaluation.  She has been restarted on eliquis prior to the procedure.  Today, she denies symptoms of palpitations, chest pain, shortness of breath, orthopnea, PND, lower extremity edema, claudication, dizziness, presyncope, syncope, bleeding, or neurologic sequela. The patient is tolerating medications without difficulties and is otherwise without complaint today.         Past Medical History  Diagnosis Date  . Personal history of colonic polyps   . Chronic a-fib (HCC)     a. CHA2DS2VASc = 5 (HTN, age > 20, DM2, female);  b. Eliquis d/c'd 4/015 in setting of BRBPR/GIB;  c. Rate-control w/ BB.  . Type II or unspecified type diabetes mellitus without mention of complication, not stated as uncontrolled   . Obesity, unspecified   . Hyperlipidemia   . HTN (hypertension)   . Cellulitis and abscess of leg, except foot   . Plantar fascial fibromatosis   . Diverticulosis   . RBBB   . GIB (gastrointestinal bleeding)     a. 02/2014 BRBPR;  b. 02/2014 Colonoscopy/EGD: diverticulosis with polys/polypectomy - ascending colon,  Gastritis on EDG.  . Gastritis     a. 02/2014 EGD.  Marland Kitchen Anemia     a. 02/2014 in setting of GIB req 3U PRBC's.         Past Surgical History  Procedure Laterality Date  . No past surgeries    . Peripheral vascular catheterization N/A 03/15/2015    Procedure: Visceral Angiography;  Surgeon: Julie Needy, MD;  Location: [facility] INVASIVE CV LAB;  Service: Cardiovascular;  Laterality: N/A;  . Embolization N/A 03/15/2015    Procedure: Embolization;  Surgeon: Julie Needy, MD;  Location: [facility] INVASIVE CV LAB;  Service: Cardiovascular;  Laterality: N/A;           Current Outpatient Prescriptions  Medication Sig Dispense Refill  . albuterol (PROVENTIL HFA) 108 (90 BASE) MCG/ACT inhaler Inhale 2 puffs into the lungs every 6 (six) hours as needed for wheezing or shortness of breath.     . doxazosin (CARDURA) 4 MG tablet TAKE 1 TABLET BY MOUTH DAILY (PLEASE CALL AND SCHEDULE A SIX MONTH FOLLOW UP APPOINTMENT)    . ferrous sulfate (FERROUSUL) 325 (65 FE) MG tablet Take 1 tablet (325 mg total) by mouth 2 (two) times daily with a meal. 60 tablet 3  . fluticasone (FLONASE) 50 MCG/ACT nasal spray Place 2 sprays into the nose 2 (two) times daily.     . Fluticasone-Salmeterol (ADVAIR  DISKUS) 250-50 MCG/DOSE AEPB Inhale into the lungs as directed.    . furosemide (LASIX) 40 MG tablet Take 1 tablet by mouth 2 (two) times daily as needed. swelling    . Magnesium 250 MG TABS Take 1-2 tablets by mouth daily.  3  . metoprolol (LOPRESSOR) 100 MG tablet Take 100 mg by mouth 2 (two) times daily.    . potassium chloride (K-DUR) 10 MEQ tablet Take 1 tablet (10 mEq total) by mouth daily. 30 tablet 0   No current facility-administered medications for this visit.    Allergies:   Diltiazem hcl   Social History:  The patient  reports that she has never smoked. She has never used smokeless tobacco. She reports that she does not drink alcohol or use illicit drugs.   Family History:  The  patient's family history includes Asthma in her father; Emphysema in her father; Heart attack in her father; Hypertension in her mother.    ROS:  Please see the history of present illness.   All other systems are reviewed and negative.    PHYSICAL EXAM: Vitals:   07/18/16 0614  BP: 139/79  Pulse: 63  Resp: 20  Temp: 97.8 F (36.6 C)   GEN: Obese, well nourished, well developed, in no acute distress  HEENT: normal  Neck: no JVD, carotid bruits, or masses Cardiac: iRRR; no murmurs, rubs, or gallops, 1+BLE edema, +bilateral compression hose Respiratory:  clear to auscultation bilaterally, normal work of breathing GI: soft, nontender, nondistended, + BS MS: no deformity or atrophy  Skin: warm and dry  Neuro:  delayed speech, very mild expressive aphasia which is chronic, Strength and sensation are intact Psych: euthymic mood, full affect  Labs reviewed     Wt Readings from Last 3 Encounters:  04/15/16 214 lb 3.2 oz (97.16 kg)  02/29/16 210 lb 5 oz (95.397 kg)  04/27/15 202 lb 8 oz (91.853 kg)      Other studies Reviewed: Additional studies/ records that were reviewed today include: [name]'s office notes  ASSESSMENT AND PLAN:  1.  Permanent atrial fibrillation I have seen [name] is a 81 y.o. female who has been referred by Julie Julie MillingGollan for a Watchman left atrial appendage closure device.  She has a history of permanent atrial fibrillation.  This patients CHA2DS2-VASc Score and unadjusted Ischemic Stroke Rate (% per year) is equal to 7.2 % stroke rate/year from a score of 5 which necessitates long term oral anticoagulation to prevent stroke. HasBled score is 4.  Modified Rankin Score is 0. Unfortunately, she is not felt to be a long term Warfarin candidate secondary to prior GI bleeding requiring transfusion.   Procedural risks for the Watchman implant have been reviewed with the patient including a 1% risk of stroke, 2% risk of perforation, 0.1% risk of device  embolization.  She is ready to proceed.  Julie Julie MillingGollan and I have reviewed today and both agree that this is an appropriate procedure for this patient (refer to his note from April).   Julie RangeJames Jashayla Glatfelter MD, [facility]FACC 07/18/2016 7:36 AM

## 2016-07-18 NOTE — Progress Notes (Signed)
  Echocardiogram Echocardiogram Transesophageal has been performed.  Janalyn HarderWest, Evah Rashid R 07/18/2016, 9:07 AM

## 2016-07-18 NOTE — Anesthesia Postprocedure Evaluation (Signed)
Anesthesia Post Note  Patient: Julie AmmonsJoanne S Gritz  Procedure(s) Performed: Procedure(s): Transesophageal Echocardiogram Rhae Hammock(Tee)  Patient location during evaluation: PACU Anesthesia Type: General Level of consciousness: awake and alert Pain management: pain level controlled Vital Signs Assessment: post-procedure vital signs reviewed and stable Respiratory status: spontaneous breathing, nonlabored ventilation and respiratory function stable Cardiovascular status: blood pressure returned to baseline and stable Postop Assessment: no signs of nausea or vomiting Anesthetic complications: no    Last Vitals:  Vitals:   07/18/16 0940 07/18/16 1000  BP: (!) 136/56 130/70  Pulse: (!) 56 68  Resp: 14 (!) 26  Temp:  36.4 C    Last Pain:  Vitals:   07/18/16 0906  TempSrc:   PainSc: 2                  Chaunda Vandergriff,W. EDMOND

## 2016-07-18 NOTE — Progress Notes (Signed)
Site area: left radial a line Site Prior to Removal:  Level 0 Pressure Applied For:  15 minutes Manual:   yes Patient Status During Pull:  stable Post Pull Site:  Level  0 Post Pull Instructions Given:  yes Post Pull Pulses Present: yes Dressing Applied:  Small tegaderm Bedrest begins @  0940 Comments:

## 2016-07-18 NOTE — Anesthesia Procedure Notes (Signed)
Procedure Name: Intubation Date/Time: 07/18/2016 8:08 AM Performed by: Reine JustFLOWERS, Lanis Storlie T Pre-anesthesia Checklist: Patient identified, Emergency Drugs available, Suction available, Patient being monitored and Timeout performed Patient Re-evaluated:Patient Re-evaluated prior to inductionOxygen Delivery Method: Circle system utilized and Simple face mask Preoxygenation: Pre-oxygenation with 100% oxygen Intubation Type: IV induction Ventilation: Mask ventilation without difficulty Laryngoscope Size: Miller and 2 Grade View: Grade I Tube type: Oral Tube size: 7.5 mm Number of attempts: 1 Airway Equipment and Method: Patient positioned with wedge pillow and Stylet Placement Confirmation: ETT inserted through vocal cords under direct vision,  positive ETCO2 and breath sounds checked- equal and bilateral Secured at: 21 cm Tube secured with: Tape Dental Injury: Teeth and Oropharynx as per pre-operative assessment

## 2016-07-18 NOTE — Transfer of Care (Signed)
Immediate Anesthesia Transfer of Care Note  Patient: [name]  Procedure(s) Performed: Procedure(s): Transesophageal Echocardiogram Rhae Hammock(Tee)  Patient Location: PACU and Cath Lab  Anesthesia Type:General  Level of Consciousness: awake, alert  and oriented  Airway & Oxygen Therapy: Patient Spontanous Breathing and Patient connected to nasal cannula oxygen  Post-op Assessment: Report given to RN and Post -op Vital signs reviewed and stable  Post vital signs: Reviewed and stable  Last Vitals:  Vitals:   07/18/16 0614  BP: 139/79  Pulse: 63  Resp: 20  Temp: 36.6 C    Last Pain:  Vitals:   07/18/16 0906  TempSrc:   PainSc: 2       Patients Stated Pain Goal: 3 (07/18/16 [id number])  Complications: No apparent anesthesia complications

## 2016-07-19 ENCOUNTER — Encounter (HOSPITAL_COMMUNITY): Payer: Self-pay | Admitting: Cardiology

## 2016-07-19 MED FILL — Cefazolin Sodium-Dextrose IV Solution 2 GM/100ML-4%: INTRAVENOUS | Qty: 100 | Status: AC

## 2016-07-19 MED FILL — Lidocaine HCl Local Preservative Free (PF) Inj 1%: INTRAMUSCULAR | Qty: 30 | Status: AC

## 2016-08-16 ENCOUNTER — Encounter: Payer: Self-pay | Admitting: Internal Medicine

## 2016-08-16 ENCOUNTER — Telehealth: Payer: Self-pay | Admitting: Nurse Practitioner

## 2016-08-16 DIAGNOSIS — I480 Paroxysmal atrial fibrillation: Secondary | ICD-10-CM

## 2016-08-16 NOTE — Telephone Encounter (Signed)
Spoke with patient. Discussed with Dr Johney FrameAllred and Dr Excell Seltzerooper plan since patient had LAA thrombus at time of last Watchman attempt. She has been on Eliquis without interruption since that time and reports tolerating without bleeding issues.   Cardiac CT scheduled for 08-26-16 at 11AM.  Pt to arrive at 10:30AM. NPO for 4 hours prior to test.   Pt aware to not miss any doses of Eliquis. Watchman tentatively scheduled for 10-19 depending on results of CT scan. Will plan TEE on the table prior to induction of anesthesia on Watchman day.  Julie BalsamAmber Nikolay Demetriou, NP 08/16/2016 10:11 AM

## 2016-08-20 ENCOUNTER — Other Ambulatory Visit: Payer: Medicare Other

## 2016-08-26 ENCOUNTER — Encounter (HOSPITAL_COMMUNITY): Payer: Self-pay

## 2016-08-26 ENCOUNTER — Ambulatory Visit (HOSPITAL_COMMUNITY)
Admission: RE | Admit: 2016-08-26 | Discharge: 2016-08-26 | Disposition: A | Discharge status: Home / Self Care | Payer: Medicare Other | Source: Ambulatory Visit | Attending: Nurse Practitioner | Admitting: Nurse Practitioner

## 2016-08-26 DIAGNOSIS — J90 Pleural effusion, not elsewhere classified: Secondary | ICD-10-CM | POA: Insufficient documentation

## 2016-08-26 DIAGNOSIS — R918 Other nonspecific abnormal finding of lung field: Secondary | ICD-10-CM | POA: Insufficient documentation

## 2016-08-26 DIAGNOSIS — I4891 Unspecified atrial fibrillation: Secondary | ICD-10-CM | POA: Diagnosis not present

## 2016-08-26 DIAGNOSIS — I480 Paroxysmal atrial fibrillation: Secondary | ICD-10-CM | POA: Diagnosis not present

## 2016-08-26 LAB — POCT I-STAT CREATININE: CREATININE: 0.9 mg/dL (ref 0.44–1.00)

## 2016-08-26 MED ORDER — IOPAMIDOL (ISOVUE-370) INJECTION 76%
INTRAVENOUS | Status: AC
  Administered 2016-08-26: 80 mL
  Filled 2016-08-26: qty 100

## 2016-08-27 NOTE — Telephone Encounter (Signed)
Discussed findings of CT scan with Dr Eden EmmsNishan. LAA thrombus noted. Recommend another 30 days of anticoagulation, then TEE on the table with plans for Watchman implant if LAA clear.

## 2016-08-28 ENCOUNTER — Telehealth: Payer: Self-pay | Admitting: Internal Medicine

## 2016-08-28 NOTE — Telephone Encounter (Signed)
Follow up    Pt verbalized that she is returning call for Amber she wants to reschedule the watchman 09/26/16 date

## 2016-08-28 NOTE — Telephone Encounter (Signed)
Spoke with patient.  Will cancel Watchman for this week and reschedule for November 16th.  Will plan for TEE with Definity on the table prior to induction of anesthesia. Pt has not had bleeding issues to date. She is aware of what to look for.  Gypsy BalsamAmber Seiler, NP 08/28/2016 10:41 AM

## 2016-09-11 ENCOUNTER — Other Ambulatory Visit: Payer: Self-pay | Admitting: Nurse Practitioner

## 2016-09-16 ENCOUNTER — Encounter: Payer: Self-pay | Admitting: Cardiovascular Disease

## 2016-09-16 ENCOUNTER — Ambulatory Visit (INDEPENDENT_AMBULATORY_CARE_PROVIDER_SITE_OTHER): Payer: Medicare Other | Admitting: Cardiovascular Disease

## 2016-09-16 VITALS — BP 130/72 | HR 61 | Ht 64.0 in | Wt 192.2 lb

## 2016-09-16 DIAGNOSIS — E7800 Pure hypercholesterolemia, unspecified: Secondary | ICD-10-CM | POA: Diagnosis not present

## 2016-09-16 DIAGNOSIS — I4891 Unspecified atrial fibrillation: Secondary | ICD-10-CM | POA: Diagnosis not present

## 2016-09-16 DIAGNOSIS — Z23 Encounter for immunization: Secondary | ICD-10-CM

## 2016-09-16 DIAGNOSIS — I10 Essential (primary) hypertension: Secondary | ICD-10-CM | POA: Diagnosis not present

## 2016-09-16 DIAGNOSIS — I5032 Chronic diastolic (congestive) heart failure: Secondary | ICD-10-CM | POA: Diagnosis not present

## 2016-09-16 NOTE — Progress Notes (Signed)
Cardiology Office Note  Date:  09/16/2016   ID:  ERIYANNA KOFOED, DOB [birth year], MRN [id number]  PCP:  Kristie Cowman, MD   Chief Complaint  Patient presents with  . other    6 month follow up. Meds reviewed by the pt. verbally.     HPI:  81 y/o female with a prior h/o permanent rate-controlled atrial fibrillation, previously anticoagulated with eliquis 5 mg twice a day  But then developed GI bleed April 2015 found to have gastritis, diverticulosis, polyps requiring polypectomy. Since then all anticoagulation has been held. Additional bleed May 2016, not on anticoagulation at that time as detailed below. She has been told in the past by her GI physicians never to take anticoagulation again  she presents today for follow-up of her atrial fibrillation Sent to Dr. Johney Frame for watchman device  Review of the chart notes indicates that she had workup for placement of the watchman device Had clot in the appendage seen on TEE, Device placement was rescheduled November 16th    she also had CT lung: Showing "rounded like mass in RLL" Concerning for atelectasis  TEE 07/09/2016: No clot mentioned at that time  TEE 07/18/2016: clot in appendage  09/26/16 scheduled  Redo TEE scheduled  Taking her eliquis, denies any GI bleeding  Was supposed to go to the beach thanksgiving   Balance is poor, walks with a cane. She denies any significant leg edema. She continues to take Lasix when necessary Lab work showing hemoglobin A1c 4.7,  EKG on today's visit showing atrial fibrillation with ventricular rate 61 bpm, right bundle branch block  Other past medical history May 2016, she had GI bleed secondary to diverticular bleed. Hemoglobin down to 6.0 on 03/15/2015  she was not on anticoagulation at the time  She had bleeding scan, embolization , required packed red blood cell transfusion at least 10 units  Sent to rehabilitation at [facility].  In April 2015, she was admitted to [facility] with fatigue  and BRBPR.  Eliquis was held and she required 3 U PRBC's during her admission.  She was seen by GI and underwent EGD and colonoscopy showing gastritis and diverticulosis with colon polyps req polypectomy.  She was placed on prilosec and H/H remained stable.    Recent echocardiogram 03/10/2014: - Left ventricle: Systolic function was normal. The estimated ejection fraction was in the range of 55% to 65%. Wall motion was normal; there were no regional wall   motion abnormalities. - Mitral valve: Mild to moderate regurgitation. - Left atrium: The atrium was mildly dilated. - Right atrium: The atrium was moderately dilated. - Tricuspid valve: Moderate-severe regurgitation. - Pulmonary arteries: PA peak pressure: 51mm Hg (S).   PMH:   has a past medical history of Anemia; Cellulitis and abscess of leg, except foot; Chronic a-fib (HCC); Diverticulosis; Gastritis; GIB (gastrointestinal bleeding); HTN (hypertension); Hyperlipidemia; Obesity, unspecified; Personal history of colonic polyps; Plantar fascial fibromatosis; RBBB; and Type II or unspecified type diabetes mellitus without mention of complication, not stated as uncontrolled.  PSH:    Past Surgical History:  Procedure Laterality Date  . EMBOLIZATION N/A 03/15/2015   Procedure: Embolization;  Surgeon: Annice Needy, MD;  Location: [facility] INVASIVE CV LAB;  Service: Cardiovascular;  Laterality: N/A;  . NO PAST SURGERIES    . PERIPHERAL VASCULAR CATHETERIZATION N/A 03/15/2015   Procedure: Visceral Angiography;  Surgeon: Annice Needy, MD;  Location: [facility] INVASIVE CV LAB;  Service: Cardiovascular;  Laterality: N/A;  . TEE WITHOUT CARDIOVERSION N/A 07/09/2016  Procedure: TRANSESOPHAGEAL ECHOCARDIOGRAM (TEE);  Surgeon: Lewayne BuntingBrian S Crenshaw, MD;  [facility];  Service: Cardiovascular;  Laterality: N/A;  . TEE WITHOUT CARDIOVERSION  07/18/2016   Procedure: Transesophageal Echocardiogram (Tee);  Surgeon: Lars MassonKatarina H Nelson, MD;  [facility];  Service: Cardiovascular;;    Current Outpatient Prescriptions  Medication Sig Dispense Refill  . albuterol (PROVENTIL HFA) 108 (90 BASE) MCG/ACT inhaler Inhale 2 puffs into the lungs every 6 (six) hours as needed for wheezing or shortness of breath.     . doxazosin (CARDURA) 4 MG tablet Take 4 mg by mouth daily.     Marland Kitchen. ELIQUIS 5 MG TABS tablet TAKE 1 TABLET BY MOUTH TWICE A DAY 180 tablet 1  . fluticasone (FLONASE) 50 MCG/ACT nasal spray Place 2 sprays into both nostrils 2 (two) times daily as needed for allergies.     . Fluticasone-Salmeterol (ADVAIR DISKUS) 250-50 MCG/DOSE AEPB Inhale 1 puff into the lungs daily as needed (for shortness of breath or wheezing).     . furosemide (LASIX) 40 MG tablet Take 40 mg by mouth as directed. Once daily but patient may take an additional dose in the afternoon for fluid retention.    . Magnesium 250 MG TABS Take 250 mg by mouth daily.   3  . metoprolol (LOPRESSOR) 100 MG tablet Take 100 mg by mouth 2 (two) times daily.     No current facility-administered medications for this visit.      Allergies:   Diltiazem hcl   Social History:  The patient  reports that she has never smoked. She has never used smokeless tobacco. She reports that she does not drink alcohol or use drugs.   Family History:   family history includes Asthma in her father; Emphysema in her father; Heart attack in her father; Hypertension in her mother.    Review of Systems: Review of Systems  Constitutional: Negative.   Respiratory: Negative.   Cardiovascular: Negative.   Gastrointestinal: Negative.   Musculoskeletal: Negative.   Neurological: Negative.   Psychiatric/Behavioral: Negative.   All other systems reviewed and are negative.    PHYSICAL EXAM: VS:  BP 130/72 (BP Location: Left Arm, Patient Position: Sitting, Cuff Size: Normal)   Pulse 61   Ht 5\' 4"  (1.626 m)   Wt 192 lb 4 oz (87.2 kg)   BMI 33.00 kg/m  , BMI Body mass index is 33 kg/m. GEN: Well  nourished, well developed, in no acute distress, obese  HEENT: normal  Neck: no JVD, carotid bruits, or masses Cardiac:Irregularly irregular tachycardia , no murmurs, rubs, or gallops,no edema  Respiratory:  clear to auscultation bilaterally, normal work of breathing GI: soft, nontender, nondistended, + BS MS: no deformity or atrophy  Skin: warm and dry, no rash Neuro:  Strength and sensation are intact Psych: euthymic mood, full affect, some confusion     Recent Labs: 07/09/2016: BUN 14; Hemoglobin 12.3; Platelets 260; Potassium 3.6; Sodium 140 08/26/2016: Creatinine, Ser 0.90    Lipid Panel No results found for: CHOL, HDL, LDLCALC, TRIG    Wt Readings from Last 3 Encounters:  09/16/16 192 lb 4 oz (87.2 kg)  07/18/16 214 lb (97.1 kg)  04/15/16 214 lb 3.2 oz (97.2 kg)       ASSESSMENT AND PLAN:  Atrial fibrillation, unspecified type (HCC) - Plan: EKG 12-Lead [place], candidate for watchman Long discussion about recent events Clot noted in her left atrial appendage Started on anticoagulation after discussion with Dr. Barbette OrElliott Appears  to be tolerating anticoagulation so far. Scheduled for repeat TEE and possible placement of the device November 16. Patient is somewhat confused, was hoping to go to the beach around Thanksgiving. We have strongly recommended that she not delay the procedure as she is high risk of bleeding.  Chronic diastolic CHF (congestive heart failure) (HCC) - Plan: EKG 12-Lead Appears relatively euvolemic on today's visit No changes to her medications  Essential hypertension Blood pressure is well controlled on today's visit. No changes made to the medications.  Pure hypercholesterolemia  Encounter for immunization - Plan: Flu Vaccine QUAD 36+ mos IM Flu shot provided   Total encounter time more than 25 minutes  Greater than 50% was spent in counseling and coordination of care with the patient   Disposition:   F/U  6  months   Orders Placed This Encounter  Procedures  . Flu Vaccine QUAD 36+ mos IM  . EKG 12-Lead     Signed, Dossie Arbourim Emersyn Kotarski, M.D., Ph.D. 09/16/2016  [facility], [place] [phone]

## 2016-09-16 NOTE — Telephone Encounter (Signed)
Pt saw Dr. Mariah MillingGollan today, mentioned that she has not heard back regarding rescheduling watchman device on 09/26/16. She would like a call back as soon as possible.  Thank you!

## 2016-09-16 NOTE — Patient Instructions (Signed)

## 2016-09-18 ENCOUNTER — Telehealth: Payer: Self-pay | Admitting: Internal Medicine

## 2016-09-18 NOTE — Telephone Encounter (Signed)
Left message for patient.  Will try again later  Gypsy BalsamAmber Kerrington Greenhalgh, NP 09/18/2016 8:33 AM

## 2016-09-18 NOTE — Telephone Encounter (Signed)
New Message  Pt voiced returning call to Np.  Please f/u

## 2016-09-19 NOTE — Telephone Encounter (Signed)
Watchman rescheduled to 10/13/2016.  Will call patient that week to review instructions.  Gypsy BalsamAmber Nile Dorning, NP 09/19/2016 9:06 AM

## 2016-10-16 NOTE — Anesthesia Preprocedure Evaluation (Addendum)
Anesthesia Evaluation  Patient identified by MRN, date of birth, ID band Patient awake    Reviewed: Allergy & Precautions, H&P , NPO status , Patient's Chart, lab work & pertinent test results  Airway Mallampati: II  TM Distance: >3 FB Neck ROM: Full    Dental  (+) Dental Advisory Given   Pulmonary neg pulmonary ROS,    breath sounds clear to auscultation       Cardiovascular hypertension, Pt. on medications and Pt. on home beta blockers + dysrhythmias Atrial Fibrillation + Valvular Problems/Murmurs  Rhythm:Regular Rate:Normal  07/2016: Left ventricle: Systolic function was normal. The estimated   ejection fraction was in the range of 55% to 60%. Wall motion was   normal; there were no regional wall motion abnormalities. - Aortic valve: There was mild regurgitation. - Mitral valve: There was mild regurgitation. - Left atrium: The atrium was severely dilated. No evidence of   thrombus in the atrial cavity or appendage. There is evidence of   thrombus in the left atrial appendage. - Right atrium: The atrium was severely dilated. No evidence of   thrombus in the atrial cavity or appendage. No evidence of   thrombus in the atrial cavity or appendage. - Atrial septum: There was a patent foramen ovale. - Tricuspid valve: There was severe regurgitation.   Neuro/Psych negative neurological ROS  negative psych ROS   GI/Hepatic negative GI ROS, Neg liver ROS,   Endo/Other  diabetesMorbid obesity  Renal/GU negative Renal ROS  negative genitourinary   Musculoskeletal   Abdominal   Peds  Hematology negative hematology ROS (+)   Anesthesia Other Findings   Reproductive/Obstetrics negative OB ROS                            Lab Results  Component Value Date   WBC 5.2 07/09/2016   HGB 12.3 07/09/2016   HCT 38.2 07/09/2016   MCV 101.9 (H) 07/09/2016   PLT 260 07/09/2016   Lab Results  Component  Value Date   CREATININE 0.90 08/26/2016   BUN 14 07/09/2016   NA 140 07/09/2016   K 3.6 07/09/2016   CL 107 07/09/2016   CO2 25 07/09/2016    Anesthesia Physical  Anesthesia Plan  ASA: III  Anesthesia Plan: General   Post-op Pain Management:    Induction: Intravenous  Airway Management Planned: Oral ETT  Additional Equipment: Arterial line  Intra-op Plan:   Post-operative Plan: Extubation in OR  Informed Consent: I have reviewed the patients History and Physical, chart, labs and discussed the procedure including the risks, benefits and alternatives for the proposed anesthesia with the patient or authorized representative who has indicated his/her understanding and acceptance.   Dental advisory given  Plan Discussed with: CRNA  Anesthesia Plan Comments:        Anesthesia Quick Evaluation

## 2016-10-17 ENCOUNTER — Encounter (HOSPITAL_COMMUNITY): Payer: Self-pay | Admitting: Anesthesiology

## 2016-10-17 ENCOUNTER — Inpatient Hospital Stay (HOSPITAL_COMMUNITY): Payer: Medicare Other | Admitting: Certified Registered"

## 2016-10-17 ENCOUNTER — Encounter (HOSPITAL_COMMUNITY): Admission: RE | Disposition: E | Payer: Self-pay | Source: Ambulatory Visit | Attending: Surgery

## 2016-10-17 ENCOUNTER — Inpatient Hospital Stay (HOSPITAL_COMMUNITY): Payer: Medicare Other

## 2016-10-17 ENCOUNTER — Inpatient Hospital Stay (HOSPITAL_COMMUNITY): Payer: Medicare Other | Admitting: Certified Registered Nurse Anesthetist

## 2016-10-17 ENCOUNTER — Inpatient Hospital Stay (HOSPITAL_COMMUNITY)
Admission: RE | Admit: 2016-10-17 | Discharge: 2016-11-11 | DRG: 907 | Disposition: E | Payer: Medicare Other | Source: Ambulatory Visit | Attending: Internal Medicine | Admitting: Internal Medicine

## 2016-10-17 DIAGNOSIS — J9811 Atelectasis: Secondary | ICD-10-CM | POA: Diagnosis not present

## 2016-10-17 DIAGNOSIS — Y92238 Other place in hospital as the place of occurrence of the external cause: Secondary | ICD-10-CM | POA: Diagnosis not present

## 2016-10-17 DIAGNOSIS — I351 Nonrheumatic aortic (valve) insufficiency: Secondary | ICD-10-CM | POA: Diagnosis present

## 2016-10-17 DIAGNOSIS — R112 Nausea with vomiting, unspecified: Secondary | ICD-10-CM | POA: Diagnosis not present

## 2016-10-17 DIAGNOSIS — Y838 Other surgical procedures as the cause of abnormal reaction of the patient, or of later complication, without mention of misadventure at the time of the procedure: Secondary | ICD-10-CM | POA: Diagnosis not present

## 2016-10-17 DIAGNOSIS — I9581 Postprocedural hypotension: Secondary | ICD-10-CM | POA: Diagnosis not present

## 2016-10-17 DIAGNOSIS — E876 Hypokalemia: Secondary | ICD-10-CM | POA: Diagnosis not present

## 2016-10-17 DIAGNOSIS — Z4659 Encounter for fitting and adjustment of other gastrointestinal appliance and device: Secondary | ICD-10-CM

## 2016-10-17 DIAGNOSIS — E785 Hyperlipidemia, unspecified: Secondary | ICD-10-CM | POA: Diagnosis present

## 2016-10-17 DIAGNOSIS — I314 Cardiac tamponade: Secondary | ICD-10-CM | POA: Diagnosis present

## 2016-10-17 DIAGNOSIS — T82599A Other mechanical complication of unspecified cardiac and vascular devices and implants, initial encounter: Secondary | ICD-10-CM | POA: Diagnosis not present

## 2016-10-17 DIAGNOSIS — J9621 Acute and chronic respiratory failure with hypoxia: Secondary | ICD-10-CM | POA: Diagnosis not present

## 2016-10-17 DIAGNOSIS — I482 Chronic atrial fibrillation: Secondary | ICD-10-CM | POA: Diagnosis present

## 2016-10-17 DIAGNOSIS — J90 Pleural effusion, not elsewhere classified: Secondary | ICD-10-CM | POA: Diagnosis not present

## 2016-10-17 DIAGNOSIS — K921 Melena: Secondary | ICD-10-CM | POA: Diagnosis not present

## 2016-10-17 DIAGNOSIS — D6832 Hemorrhagic disorder due to extrinsic circulating anticoagulants: Secondary | ICD-10-CM | POA: Diagnosis present

## 2016-10-17 DIAGNOSIS — Z938 Other artificial opening status: Secondary | ICD-10-CM

## 2016-10-17 DIAGNOSIS — G9341 Metabolic encephalopathy: Secondary | ICD-10-CM | POA: Diagnosis not present

## 2016-10-17 DIAGNOSIS — Z66 Do not resuscitate: Secondary | ICD-10-CM | POA: Diagnosis not present

## 2016-10-17 DIAGNOSIS — Z978 Presence of other specified devices: Secondary | ICD-10-CM

## 2016-10-17 DIAGNOSIS — J189 Pneumonia, unspecified organism: Secondary | ICD-10-CM

## 2016-10-17 DIAGNOSIS — Y711 Therapeutic (nonsurgical) and rehabilitative cardiovascular devices associated with adverse incidents: Secondary | ICD-10-CM | POA: Diagnosis not present

## 2016-10-17 DIAGNOSIS — R0602 Shortness of breath: Secondary | ICD-10-CM

## 2016-10-17 DIAGNOSIS — R570 Cardiogenic shock: Secondary | ICD-10-CM | POA: Diagnosis not present

## 2016-10-17 DIAGNOSIS — R0609 Other forms of dyspnea: Secondary | ICD-10-CM | POA: Diagnosis not present

## 2016-10-17 DIAGNOSIS — T45515A Adverse effect of anticoagulants, initial encounter: Secondary | ICD-10-CM | POA: Diagnosis present

## 2016-10-17 DIAGNOSIS — I481 Persistent atrial fibrillation: Secondary | ICD-10-CM

## 2016-10-17 DIAGNOSIS — J9601 Acute respiratory failure with hypoxia: Secondary | ICD-10-CM | POA: Diagnosis not present

## 2016-10-17 DIAGNOSIS — I9751 Accidental puncture and laceration of a circulatory system organ or structure during a circulatory system procedure: Secondary | ICD-10-CM | POA: Diagnosis present

## 2016-10-17 DIAGNOSIS — I4510 Unspecified right bundle-branch block: Secondary | ICD-10-CM | POA: Diagnosis present

## 2016-10-17 DIAGNOSIS — F411 Generalized anxiety disorder: Secondary | ICD-10-CM

## 2016-10-17 DIAGNOSIS — Z006 Encounter for examination for normal comparison and control in clinical research program: Secondary | ICD-10-CM

## 2016-10-17 DIAGNOSIS — T82598A Other mechanical complication of other cardiac and vascular devices and implants, initial encounter: Secondary | ICD-10-CM | POA: Diagnosis not present

## 2016-10-17 DIAGNOSIS — I071 Rheumatic tricuspid insufficiency: Secondary | ICD-10-CM | POA: Diagnosis present

## 2016-10-17 DIAGNOSIS — I350 Nonrheumatic aortic (valve) stenosis: Secondary | ICD-10-CM | POA: Diagnosis present

## 2016-10-17 DIAGNOSIS — E873 Alkalosis: Secondary | ICD-10-CM | POA: Diagnosis not present

## 2016-10-17 DIAGNOSIS — R1313 Dysphagia, pharyngeal phase: Secondary | ICD-10-CM | POA: Diagnosis not present

## 2016-10-17 DIAGNOSIS — I313 Pericardial effusion (noninflammatory): Secondary | ICD-10-CM | POA: Diagnosis present

## 2016-10-17 DIAGNOSIS — I10 Essential (primary) hypertension: Secondary | ICD-10-CM | POA: Diagnosis present

## 2016-10-17 DIAGNOSIS — Y848 Other medical procedures as the cause of abnormal reaction of the patient, or of later complication, without mention of misadventure at the time of the procedure: Secondary | ICD-10-CM | POA: Diagnosis present

## 2016-10-17 DIAGNOSIS — Z8249 Family history of ischemic heart disease and other diseases of the circulatory system: Secondary | ICD-10-CM

## 2016-10-17 DIAGNOSIS — G9340 Encephalopathy, unspecified: Secondary | ICD-10-CM | POA: Diagnosis not present

## 2016-10-17 DIAGNOSIS — J810 Acute pulmonary edema: Secondary | ICD-10-CM | POA: Diagnosis not present

## 2016-10-17 DIAGNOSIS — Z825 Family history of asthma and other chronic lower respiratory diseases: Secondary | ICD-10-CM

## 2016-10-17 DIAGNOSIS — Z515 Encounter for palliative care: Secondary | ICD-10-CM | POA: Diagnosis not present

## 2016-10-17 DIAGNOSIS — J9690 Respiratory failure, unspecified, unspecified whether with hypoxia or hypercapnia: Secondary | ICD-10-CM

## 2016-10-17 DIAGNOSIS — Z86010 Personal history of colonic polyps: Secondary | ICD-10-CM

## 2016-10-17 DIAGNOSIS — D6489 Other specified anemias: Secondary | ICD-10-CM | POA: Diagnosis not present

## 2016-10-17 DIAGNOSIS — E119 Type 2 diabetes mellitus without complications: Secondary | ICD-10-CM | POA: Diagnosis present

## 2016-10-17 DIAGNOSIS — T82599D Other mechanical complication of unspecified cardiac and vascular devices and implants, subsequent encounter: Secondary | ICD-10-CM

## 2016-10-17 DIAGNOSIS — Z9689 Presence of other specified functional implants: Secondary | ICD-10-CM

## 2016-10-17 DIAGNOSIS — L7632 Postprocedural hematoma of skin and subcutaneous tissue following other procedure: Secondary | ICD-10-CM | POA: Diagnosis not present

## 2016-10-17 DIAGNOSIS — R0600 Dyspnea, unspecified: Secondary | ICD-10-CM | POA: Diagnosis not present

## 2016-10-17 DIAGNOSIS — Z6838 Body mass index (BMI) 38.0-38.9, adult: Secondary | ICD-10-CM

## 2016-10-17 DIAGNOSIS — I4891 Unspecified atrial fibrillation: Secondary | ICD-10-CM | POA: Diagnosis not present

## 2016-10-17 DIAGNOSIS — I480 Paroxysmal atrial fibrillation: Secondary | ICD-10-CM | POA: Diagnosis not present

## 2016-10-17 DIAGNOSIS — I312 Hemopericardium, not elsewhere classified: Secondary | ICD-10-CM | POA: Diagnosis present

## 2016-10-17 DIAGNOSIS — E669 Obesity, unspecified: Secondary | ICD-10-CM | POA: Diagnosis present

## 2016-10-17 DIAGNOSIS — R319 Hematuria, unspecified: Secondary | ICD-10-CM | POA: Diagnosis not present

## 2016-10-17 HISTORY — PX: LEFT ATRIAL APPENDAGE OCCLUSION: SHX173A

## 2016-10-17 HISTORY — PX: CARDIAC CATHETERIZATION: SHX172

## 2016-10-17 HISTORY — PX: CLIPPING OF ATRIAL APPENDAGE: SHX5773

## 2016-10-17 LAB — POCT I-STAT 3, ART BLOOD GAS (G3+)
ACID-BASE DEFICIT: 15 mmol/L — AB (ref 0.0–2.0)
ACID-BASE DEFICIT: 9 mmol/L — AB (ref 0.0–2.0)
Acid-base deficit: 8 mmol/L — ABNORMAL HIGH (ref 0.0–2.0)
Acid-base deficit: 4 mmol/L — ABNORMAL HIGH (ref 0.0–2.0)
Acid-base deficit: 6 mmol/L — ABNORMAL HIGH (ref 0.0–2.0)
Acid-base deficit: 7 mmol/L — ABNORMAL HIGH (ref 0.0–2.0)
BICARBONATE: 13.8 mmol/L — AB (ref 20.0–28.0)
BICARBONATE: 19.7 mmol/L — AB (ref 20.0–28.0)
Bicarbonate: 17.8 mmol/L — ABNORMAL LOW (ref 20.0–28.0)
Bicarbonate: 19.4 mmol/L — ABNORMAL LOW (ref 20.0–28.0)
Bicarbonate: 21 mmol/L (ref 20.0–28.0)
Bicarbonate: 20.8 mmol/L (ref 20.0–28.0)
O2 SAT: 100 %
O2 SAT: 97 %
O2 SAT: 100 %
O2 SAT: 95 %
O2 Saturation: 99 %
O2 Saturation: 100 %
PCO2 ART: 35 mmHg (ref 32.0–48.0)
PCO2 ART: 35.4 mmHg (ref 32.0–48.0)
PCO2 ART: 44.2 mmHg (ref 32.0–48.0)
PH ART: 7.351 (ref 7.350–7.450)
PH ART: 7.38 (ref 7.350–7.450)
PH ART: 7.268 — AB (ref 7.350–7.450)
PO2 ART: 182 mmHg — AB (ref 83.0–108.0)
PO2 ART: 111 mmHg — AB (ref 83.0–108.0)
PO2 ART: 75 mmHg — AB (ref 83.0–108.0)
Patient temperature: 94.3
TCO2: 15 mmol/L (ref 0–100)
TCO2: 19 mmol/L (ref 0–100)
TCO2: 21 mmol/L (ref 0–100)
TCO2: 20 mmol/L (ref 0–100)
TCO2: 22 mmol/L (ref 0–100)
TCO2: 22 mmol/L (ref 0–100)
pCO2 arterial: 41.4 mmHg (ref 32.0–48.0)
pCO2 arterial: 40.3 mmHg (ref 32.0–48.0)
pCO2 arterial: 50.8 mmHg — ABNORMAL HIGH (ref 32.0–48.0)
pH, Arterial: 7.129 — CL (ref 7.350–7.450)
pH, Arterial: 7.197 — CL (ref 7.350–7.450)
pH, Arterial: 7.252 — ABNORMAL LOW (ref 7.350–7.450)
pO2, Arterial: 345 mmHg — ABNORMAL HIGH (ref 83.0–108.0)
pO2, Arterial: 278 mmHg — ABNORMAL HIGH (ref 83.0–108.0)
pO2, Arterial: 323 mmHg — ABNORMAL HIGH (ref 83.0–108.0)

## 2016-10-17 LAB — POCT I-STAT, CHEM 8
BUN: 10 mg/dL (ref 6–20)
BUN: 7 mg/dL (ref 6–20)
BUN: 9 mg/dL (ref 6–20)
BUN: 7 mg/dL (ref 6–20)
BUN: 8 mg/dL (ref 6–20)
BUN: 12 mg/dL (ref 6–20)
CALCIUM ION: 0.75 mmol/L — AB (ref 1.15–1.40)
CALCIUM ION: 0.79 mmol/L — AB (ref 1.15–1.40)
CHLORIDE: 103 mmol/L (ref 101–111)
CHLORIDE: 105 mmol/L (ref 101–111)
CHLORIDE: 112 mmol/L — AB (ref 101–111)
CREATININE: 0.6 mg/dL (ref 0.44–1.00)
CREATININE: 0.3 mg/dL — AB (ref 0.44–1.00)
CREATININE: 0.5 mg/dL (ref 0.44–1.00)
CREATININE: 0.8 mg/dL (ref 0.44–1.00)
Calcium, Ion: 0.41 mmol/L — CL (ref 1.15–1.40)
Calcium, Ion: 0.8 mmol/L — CL (ref 1.15–1.40)
Calcium, Ion: 0.36 mmol/L — CL (ref 1.15–1.40)
Calcium, Ion: 1.28 mmol/L (ref 1.15–1.40)
Chloride: 106 mmol/L (ref 101–111)
Chloride: 108 mmol/L (ref 101–111)
Chloride: 107 mmol/L (ref 101–111)
Creatinine, Ser: 0.4 mg/dL — ABNORMAL LOW (ref 0.44–1.00)
Creatinine, Ser: 0.5 mg/dL (ref 0.44–1.00)
Glucose, Bld: 346 mg/dL — ABNORMAL HIGH (ref 65–99)
Glucose, Bld: 232 mg/dL — ABNORMAL HIGH (ref 65–99)
Glucose, Bld: 288 mg/dL — ABNORMAL HIGH (ref 65–99)
Glucose, Bld: 189 mg/dL — ABNORMAL HIGH (ref 65–99)
Glucose, Bld: 214 mg/dL — ABNORMAL HIGH (ref 65–99)
Glucose, Bld: 143 mg/dL — ABNORMAL HIGH (ref 65–99)
HCT: 18 % — ABNORMAL LOW (ref 36.0–46.0)
HCT: 21 % — ABNORMAL LOW (ref 36.0–46.0)
HCT: 22 % — ABNORMAL LOW (ref 36.0–46.0)
HEMATOCRIT: 36 % (ref 36.0–46.0)
HEMATOCRIT: 27 % — AB (ref 36.0–46.0)
HEMATOCRIT: 31 % — AB (ref 36.0–46.0)
Hemoglobin: 12.2 g/dL (ref 12.0–15.0)
Hemoglobin: 6.1 g/dL — CL (ref 12.0–15.0)
Hemoglobin: 9.2 g/dL — ABNORMAL LOW (ref 12.0–15.0)
Hemoglobin: 7.1 g/dL — ABNORMAL LOW (ref 12.0–15.0)
Hemoglobin: 7.5 g/dL — ABNORMAL LOW (ref 12.0–15.0)
Hemoglobin: 10.5 g/dL — ABNORMAL LOW (ref 12.0–15.0)
POTASSIUM: 5.2 mmol/L — AB (ref 3.5–5.1)
POTASSIUM: 3.3 mmol/L — AB (ref 3.5–5.1)
POTASSIUM: 4.1 mmol/L (ref 3.5–5.1)
Potassium: 3.5 mmol/L (ref 3.5–5.1)
Potassium: 4.2 mmol/L (ref 3.5–5.1)
Potassium: 3.4 mmol/L — ABNORMAL LOW (ref 3.5–5.1)
SODIUM: 145 mmol/L (ref 135–145)
SODIUM: 145 mmol/L (ref 135–145)
SODIUM: 140 mmol/L (ref 135–145)
Sodium: 137 mmol/L (ref 135–145)
Sodium: 143 mmol/L (ref 135–145)
Sodium: 144 mmol/L (ref 135–145)
TCO2: 16 mmol/L (ref 0–100)
TCO2: 19 mmol/L (ref 0–100)
TCO2: 21 mmol/L (ref 0–100)
TCO2: 21 mmol/L (ref 0–100)
TCO2: 22 mmol/L (ref 0–100)
TCO2: 22 mmol/L (ref 0–100)

## 2016-10-17 LAB — CBC
HCT: 32.5 % — ABNORMAL LOW (ref 36.0–46.0)
HCT: 33.6 % — ABNORMAL LOW (ref 36.0–46.0)
HEMATOCRIT: 30.5 % — AB (ref 36.0–46.0)
HEMATOCRIT: 36 % (ref 36.0–46.0)
HEMATOCRIT: 36.2 % (ref 36.0–46.0)
HEMOGLOBIN: 10.3 g/dL — AB (ref 12.0–15.0)
HEMOGLOBIN: 12.4 g/dL (ref 12.0–15.0)
HEMOGLOBIN: 12.4 g/dL (ref 12.0–15.0)
Hemoglobin: 10.8 g/dL — ABNORMAL LOW (ref 12.0–15.0)
Hemoglobin: 11.2 g/dL — ABNORMAL LOW (ref 12.0–15.0)
MCH: 32.9 pg (ref 26.0–34.0)
MCH: 30.6 pg (ref 26.0–34.0)
MCH: 30.2 pg (ref 26.0–34.0)
MCH: 30.8 pg (ref 26.0–34.0)
MCH: 30.3 pg (ref 26.0–34.0)
MCHC: 33.2 g/dL (ref 30.0–36.0)
MCHC: 33.3 g/dL (ref 30.0–36.0)
MCHC: 33.8 g/dL (ref 30.0–36.0)
MCHC: 34.4 g/dL (ref 30.0–36.0)
MCHC: 34.3 g/dL (ref 30.0–36.0)
MCV: 99.1 fL (ref 78.0–100.0)
MCV: 91.8 fL (ref 78.0–100.0)
MCV: 89.4 fL (ref 78.0–100.0)
MCV: 89.6 fL (ref 78.0–100.0)
MCV: 88.5 fL (ref 78.0–100.0)
PLATELETS: 221 10*3/uL (ref 150–400)
PLATELETS: 108 10*3/uL — AB (ref 150–400)
PLATELETS: 114 10*3/uL — AB (ref 150–400)
Platelets: 113 10*3/uL — ABNORMAL LOW (ref 150–400)
Platelets: 138 10*3/uL — ABNORMAL LOW (ref 150–400)
RBC: 3.28 MIL/uL — ABNORMAL LOW (ref 3.87–5.11)
RBC: 3.66 MIL/uL — AB (ref 3.87–5.11)
RBC: 3.41 MIL/uL — AB (ref 3.87–5.11)
RBC: 4.02 MIL/uL (ref 3.87–5.11)
RBC: 4.09 MIL/uL (ref 3.87–5.11)
RDW: 15.7 % — AB (ref 11.5–15.5)
RDW: 17.8 % — AB (ref 11.5–15.5)
RDW: 18 % — ABNORMAL HIGH (ref 11.5–15.5)
RDW: 18.6 % — ABNORMAL HIGH (ref 11.5–15.5)
RDW: 19.3 % — AB (ref 11.5–15.5)
WBC: 5.1 10*3/uL (ref 4.0–10.5)
WBC: 9.2 10*3/uL (ref 4.0–10.5)
WBC: 17.5 10*3/uL — AB (ref 4.0–10.5)
WBC: 19.8 10*3/uL — ABNORMAL HIGH (ref 4.0–10.5)
WBC: 18.8 10*3/uL — ABNORMAL HIGH (ref 4.0–10.5)

## 2016-10-17 LAB — GLUCOSE, CAPILLARY
GLUCOSE-CAPILLARY: 130 mg/dL — AB (ref 65–99)
GLUCOSE-CAPILLARY: 132 mg/dL — AB (ref 65–99)
Glucose-Capillary: 111 mg/dL — ABNORMAL HIGH (ref 65–99)
Glucose-Capillary: 115 mg/dL — ABNORMAL HIGH (ref 65–99)
Glucose-Capillary: 125 mg/dL — ABNORMAL HIGH (ref 65–99)
Glucose-Capillary: 129 mg/dL — ABNORMAL HIGH (ref 65–99)
Glucose-Capillary: 138 mg/dL — ABNORMAL HIGH (ref 65–99)
Glucose-Capillary: 141 mg/dL — ABNORMAL HIGH (ref 65–99)
Glucose-Capillary: 96 mg/dL (ref 65–99)

## 2016-10-17 LAB — DIC (DISSEMINATED INTRAVASCULAR COAGULATION) PANEL
D DIMER QUANT: 4.51 ug{FEU}/mL — AB (ref 0.00–0.50)
FIBRINOGEN: 126 mg/dL — AB (ref 210–475)
PLATELETS: 108 10*3/uL — AB (ref 150–400)

## 2016-10-17 LAB — PROTIME-INR
INR: 1.86
Prothrombin Time: 21.6 seconds — ABNORMAL HIGH (ref 11.4–15.2)

## 2016-10-17 LAB — MASSIVE TRANSFUSION PROTOCOL ORDER (BLOOD BANK NOTIFICATION)

## 2016-10-17 LAB — MAGNESIUM: MAGNESIUM: 2.1 mg/dL (ref 1.7–2.4)

## 2016-10-17 LAB — BASIC METABOLIC PANEL
Anion gap: 7 (ref 5–15)
BUN: 10 mg/dL (ref 6–20)
CALCIUM: 8.9 mg/dL (ref 8.9–10.3)
CO2: 23 mmol/L (ref 22–32)
CREATININE: 0.83 mg/dL (ref 0.44–1.00)
Chloride: 108 mmol/L (ref 101–111)
GFR calc Af Amer: >60 mL/min (ref >60)
GLUCOSE: 94 mg/dL (ref 65–99)
Potassium: 3.6 mmol/L (ref 3.5–5.1)
Sodium: 138 mmol/L (ref 135–145)

## 2016-10-17 LAB — HEMOGLOBIN AND HEMATOCRIT, BLOOD
HEMATOCRIT: 23.9 % — AB (ref 36.0–46.0)
Hemoglobin: 8.2 g/dL — ABNORMAL LOW (ref 12.0–15.0)

## 2016-10-17 LAB — DIC (DISSEMINATED INTRAVASCULAR COAGULATION)PANEL
INR: 4.1
Prothrombin Time: 40.8 seconds — ABNORMAL HIGH (ref 11.4–15.2)
Smear Review: NONE SEEN
aPTT: 71 seconds — ABNORMAL HIGH (ref 24–36)

## 2016-10-17 LAB — PREPARE RBC (CROSSMATCH)

## 2016-10-17 LAB — CREATININE, SERUM
Creatinine, Ser: 0.95 mg/dL (ref 0.44–1.00)
GFR calc non Af Amer: 55 mL/min — ABNORMAL LOW (ref >60)

## 2016-10-17 LAB — POCT I-STAT 4, (NA,K, GLUC, HGB,HCT)
GLUCOSE: 105 mg/dL — AB (ref 65–99)
HEMATOCRIT: 35 % — AB (ref 36.0–46.0)
HEMOGLOBIN: 11.9 g/dL — AB (ref 12.0–15.0)
Potassium: 3.1 mmol/L — ABNORMAL LOW (ref 3.5–5.1)
SODIUM: 145 mmol/L (ref 135–145)

## 2016-10-17 LAB — APTT: aPTT: 55 seconds — ABNORMAL HIGH (ref 24–36)

## 2016-10-17 LAB — FIBRINOGEN
Fibrinogen: <60 mg/dL — CL (ref 210–475)
Fibrinogen: 181 mg/dL — ABNORMAL LOW (ref 210–475)

## 2016-10-17 LAB — PLATELET COUNT: PLATELETS: 69 10*3/uL — AB (ref 150–400)

## 2016-10-17 LAB — MRSA PCR SCREENING: MRSA BY PCR: NEGATIVE

## 2016-10-17 LAB — POCT ACTIVATED CLOTTING TIME: Activated Clotting Time: 274 seconds

## 2016-10-17 SURGERY — REDO STERNOTOMY
Anesthesia: General | Site: Chest

## 2016-10-17 SURGERY — LEFT ATRIAL APPENDAGE OCCLUSION
Anesthesia: General

## 2016-10-17 MED ORDER — DOPAMINE-DEXTROSE 3.2-5 MG/ML-% IV SOLN
0.0000 ug/kg/min | INTRAVENOUS | Status: DC
  Filled 2016-10-17: qty 250

## 2016-10-17 MED ORDER — HEPARIN SODIUM (PORCINE) 1000 UNIT/ML IJ SOLN
INTRAMUSCULAR | Status: DC | PRN
  Administered 2016-10-17: 30000 [IU] via INTRAVENOUS

## 2016-10-17 MED ORDER — ESMOLOL HCL 100 MG/10ML IV SOLN
INTRAVENOUS | Status: DC | PRN
Start: 2016-10-17 — End: 2016-10-17
  Administered 2016-10-17: 50 mg via INTRAVENOUS

## 2016-10-17 MED ORDER — SODIUM CHLORIDE 0.9 % IV SOLN
INTRAVENOUS | Status: DC
  Administered 2016-10-17: 2.9 [IU]/h via INTRAVENOUS
  Filled 2016-10-17 (×2): qty 2.5

## 2016-10-17 MED ORDER — CHLORHEXIDINE GLUCONATE 0.12 % MT SOLN
15.0000 mL | OROMUCOSAL | Status: AC
  Administered 2016-10-17: 15 mL via OROMUCOSAL

## 2016-10-17 MED ORDER — EPINEPHRINE PF 1 MG/ML IJ SOLN
0.0000 ug/min | INTRAVENOUS | Status: DC
  Filled 2016-10-17 (×2): qty 4

## 2016-10-17 MED ORDER — TRANEXAMIC ACID (OHS) BOLUS VIA INFUSION
15.0000 mg/kg | INTRAVENOUS | Status: AC
  Administered 2016-10-17: 1333.5 mg via INTRAVENOUS
  Filled 2016-10-17: qty 1334

## 2016-10-17 MED ORDER — METOPROLOL TARTRATE 25 MG/10 ML ORAL SUSPENSION: 25.0000 mg | Freq: Two times a day (BID) | ORAL | Status: DC

## 2016-10-17 MED ORDER — TRAMADOL HCL 50 MG PO TABS: 50.0000 mg | ORAL_TABLET | ORAL | Status: DC | PRN

## 2016-10-17 MED ORDER — METOPROLOL TARTRATE 5 MG/5ML IV SOLN
INTRAVENOUS | Status: DC | PRN
Start: 2016-10-17 — End: 2016-10-17
  Administered 2016-10-17: 3 mg via INTRAVENOUS

## 2016-10-17 MED ORDER — HYDRALAZINE HCL 20 MG/ML IJ SOLN
10.0000 mg | INTRAMUSCULAR | Status: DC | PRN
  Administered 2016-10-22: 10 mg via INTRAVENOUS
  Filled 2016-10-17: qty 1

## 2016-10-17 MED ORDER — ACETAMINOPHEN 160 MG/5ML PO SOLN: 1000.0000 mg | Freq: Four times a day (QID) | ORAL | Status: DC

## 2016-10-17 MED ORDER — ASPIRIN EC 325 MG PO TBEC: 325.0000 mg | DELAYED_RELEASE_TABLET | Freq: Every day | ORAL | Status: DC

## 2016-10-17 MED ORDER — FENTANYL CITRATE (PF) 100 MCG/2ML IJ SOLN
INTRAMUSCULAR | Status: DC | PRN
  Administered 2016-10-17: 100 ug via INTRAVENOUS
  Administered 2016-10-17: 50 ug via INTRAVENOUS
  Administered 2016-10-17: 100 ug via INTRAVENOUS

## 2016-10-17 MED ORDER — SODIUM CHLORIDE 0.9 % IV SOLN
INTRAVENOUS | Status: DC
  Administered 2016-10-25 – 2016-10-27 (×2): via INTRAVENOUS

## 2016-10-17 MED ORDER — NOREPINEPHRINE BITARTRATE 1 MG/ML IV SOLN
0.0000 ug/min | INTRAVENOUS | Status: DC
  Filled 2016-10-17: qty 4

## 2016-10-17 MED ORDER — NOREPINEPHRINE BITARTRATE 1 MG/ML IV SOLN
INTRAVENOUS | Status: DC | PRN
  Administered 2016-10-17: 16 ug/kg/min via INTRAVENOUS

## 2016-10-17 MED ORDER — ROCURONIUM BROMIDE 100 MG/10ML IV SOLN
INTRAVENOUS | Status: DC | PRN
  Administered 2016-10-17 (×2): 50 mg via INTRAVENOUS

## 2016-10-17 MED ORDER — VANCOMYCIN HCL IN DEXTROSE 1-5 GM/200ML-% IV SOLN
1000.0000 mg | Freq: Once | INTRAVENOUS | Status: AC
  Administered 2016-10-17: 1000 mg via INTRAVENOUS
  Filled 2016-10-17: qty 200

## 2016-10-17 MED ORDER — OXYCODONE HCL 5 MG PO TABS: 5.0000 mg | ORAL_TABLET | ORAL | Status: DC | PRN

## 2016-10-17 MED ORDER — MIDAZOLAM HCL 2 MG/2ML IJ SOLN
2.0000 mg | INTRAMUSCULAR | Status: DC | PRN
  Administered 2016-10-17: 2 mg via INTRAVENOUS
  Filled 2016-10-17: qty 2

## 2016-10-17 MED ORDER — SODIUM CHLORIDE 0.9 % IV SOLN
INTRAVENOUS | Status: DC
  Administered 2016-10-17 (×2): via INTRAVENOUS

## 2016-10-17 MED ORDER — SODIUM CHLORIDE 0.9 % IV SOLN
INTRAVENOUS | Status: DC | PRN
  Administered 2016-10-17: 2.5 [IU]/h via INTRAVENOUS

## 2016-10-17 MED ORDER — ACETAMINOPHEN 650 MG RE SUPP
650.0000 mg | Freq: Once | RECTAL | Status: AC
  Administered 2016-10-17: 650 mg via RECTAL

## 2016-10-17 MED ORDER — PROTHROMBIN COMPLEX CONC HUMAN 500 UNITS IV KIT
4320.0000 [IU] | PACK | Status: AC
  Administered 2016-10-17: 4320 [IU] via INTRAVENOUS
  Filled 2016-10-17: qty 173

## 2016-10-17 MED ORDER — SODIUM CHLORIDE 0.45 % IV SOLN: INTRAVENOUS | Status: DC

## 2016-10-17 MED ORDER — PERFLUTREN LIPID MICROSPHERE
1.0000 mL | INTRAVENOUS | Status: DC | PRN
  Administered 2016-10-17: 8 mL via INTRAVENOUS
  Filled 2016-10-17: qty 10

## 2016-10-17 MED ORDER — MIDAZOLAM HCL 2 MG/2ML IJ SOLN
INTRAMUSCULAR | Status: DC | PRN
  Administered 2016-10-17: 2 mg via INTRAVENOUS

## 2016-10-17 MED ORDER — LIDOCAINE HCL (PF) 1 % IJ SOLN
INTRAMUSCULAR | Status: AC
  Filled 2016-10-17: qty 30

## 2016-10-17 MED ORDER — MIDAZOLAM HCL 5 MG/5ML IJ SOLN
INTRAMUSCULAR | Status: DC | PRN
  Administered 2016-10-17: 2 mg via INTRAVENOUS

## 2016-10-17 MED ORDER — ALBUMIN HUMAN 5 % IV SOLN
INTRAVENOUS | Status: DC | PRN
  Administered 2016-10-17 (×3): via INTRAVENOUS

## 2016-10-17 MED ORDER — MIDAZOLAM HCL 2 MG/2ML IJ SOLN
INTRAMUSCULAR | Status: AC
  Filled 2016-10-17: qty 2

## 2016-10-17 MED ORDER — FAMOTIDINE IN NACL 20-0.9 MG/50ML-% IV SOLN
20.0000 mg | Freq: Two times a day (BID) | INTRAVENOUS | Status: AC
  Administered 2016-10-17 (×2): 20 mg via INTRAVENOUS
  Filled 2016-10-17: qty 50

## 2016-10-17 MED ORDER — NITROGLYCERIN IN D5W 200-5 MCG/ML-% IV SOLN: 0.0000 ug/min | INTRAVENOUS | Status: DC

## 2016-10-17 MED ORDER — DEXMEDETOMIDINE HCL IN NACL 400 MCG/100ML IV SOLN
0.1000 ug/kg/h | INTRAVENOUS | Status: AC
  Administered 2016-10-17: 0.7 ug/kg/h via INTRAVENOUS
  Filled 2016-10-17: qty 100

## 2016-10-17 MED ORDER — EPINEPHRINE PF 1 MG/10ML IJ SOSY
PREFILLED_SYRINGE | INTRAMUSCULAR | Status: DC | PRN
  Administered 2016-10-17: 250 ug via INTRAVENOUS
  Administered 2016-10-17: 0.3 mg via INTRAVENOUS
  Administered 2016-10-17: .5 mg via INTRAVENOUS
  Administered 2016-10-17: 250 ug via INTRAVENOUS
  Administered 2016-10-17: 500 ug via INTRAVENOUS
  Administered 2016-10-17: 250 ug via INTRAVENOUS

## 2016-10-17 MED ORDER — METOPROLOL TARTRATE 12.5 MG HALF TABLET: 12.5000 mg | ORAL_TABLET | Freq: Two times a day (BID) | ORAL | Status: DC

## 2016-10-17 MED ORDER — ACETAMINOPHEN 500 MG PO TABS
1000.0000 mg | ORAL_TABLET | Freq: Four times a day (QID) | ORAL | Status: DC
  Administered 2016-10-18 (×2): 1000 mg via ORAL
  Filled 2016-10-17 (×2): qty 2

## 2016-10-17 MED ORDER — LACTATED RINGERS IV SOLN
INTRAVENOUS | Status: DC
  Administered 2016-10-19: via INTRAVENOUS

## 2016-10-17 MED ORDER — INSULIN REGULAR BOLUS VIA INFUSION
0.0000 [IU] | Freq: Three times a day (TID) | INTRAVENOUS | Status: DC
  Filled 2016-10-17: qty 10

## 2016-10-17 MED ORDER — PANTOPRAZOLE SODIUM 40 MG PO TBEC: 40.0000 mg | DELAYED_RELEASE_TABLET | Freq: Every day | ORAL | Status: DC

## 2016-10-17 MED ORDER — METOPROLOL TARTRATE 5 MG/5ML IV SOLN
2.5000 mg | INTRAVENOUS | Status: DC | PRN
  Administered 2016-10-17 (×2): 5 mg via INTRAVENOUS
  Administered 2016-10-18 – 2016-10-19 (×2): 2.5 mg via INTRAVENOUS
  Administered 2016-10-23 – 2016-10-26 (×3): 5 mg via INTRAVENOUS
  Filled 2016-10-17 (×5): qty 5

## 2016-10-17 MED ORDER — ACETAMINOPHEN 160 MG/5ML PO SOLN: 650.0000 mg | Freq: Once | ORAL | Status: AC

## 2016-10-17 MED ORDER — FENTANYL CITRATE (PF) 100 MCG/2ML IJ SOLN: 25.0000 ug | INTRAMUSCULAR | Status: DC | PRN

## 2016-10-17 MED ORDER — ALBUMIN HUMAN 5 % IV SOLN
250.0000 mL | INTRAVENOUS | Status: AC | PRN
  Administered 2016-10-17: 250 mL via INTRAVENOUS

## 2016-10-17 MED ORDER — PHENYLEPHRINE HCL 10 MG/ML IJ SOLN
0.0000 ug/min | INTRAVENOUS | Status: DC
  Filled 2016-10-17: qty 2

## 2016-10-17 MED ORDER — BISACODYL 5 MG PO TBEC: 10.0000 mg | DELAYED_RELEASE_TABLET | Freq: Every day | ORAL | Status: DC

## 2016-10-17 MED ORDER — SODIUM CHLORIDE 0.9 % IV SOLN
INTRAVENOUS | Status: DC
  Filled 2016-10-17: qty 30

## 2016-10-17 MED ORDER — LACTATED RINGERS IV SOLN: 500.0000 mL | Freq: Once | INTRAVENOUS | Status: DC | PRN

## 2016-10-17 MED ORDER — SODIUM BICARBONATE 4.2 % IV SOLN
INTRAVENOUS | Status: DC | PRN
  Administered 2016-10-17 (×2): 50 meq via INTRAVENOUS

## 2016-10-17 MED ORDER — DEXTROSE 5 % IV SOLN
750.0000 mg | INTRAVENOUS | Status: DC
  Filled 2016-10-17 (×2): qty 750

## 2016-10-17 MED ORDER — VITAMIN K1 10 MG/ML IJ SOLN
10.0000 mg | INTRAMUSCULAR | Status: DC
  Filled 2016-10-17: qty 1

## 2016-10-17 MED ORDER — DEXTROSE 5 % IV SOLN
INTRAVENOUS | Status: DC | PRN
  Administered 2016-10-17: 150 ug/min via INTRAVENOUS

## 2016-10-17 MED ORDER — ATROPINE SULFATE 1 MG/ML IJ SOLN
INTRAMUSCULAR | Status: DC | PRN
  Administered 2016-10-17: 1 mg via INTRAVENOUS

## 2016-10-17 MED ORDER — PHENYLEPHRINE HCL 10 MG/ML IJ SOLN
30.0000 ug/min | INTRAVENOUS | Status: DC
  Filled 2016-10-17: qty 2

## 2016-10-17 MED ORDER — PLASMA-LYTE 148 IV SOLN
INTRAVENOUS | Status: AC
  Administered 2016-10-17: 500 mL
  Filled 2016-10-17: qty 2.5

## 2016-10-17 MED ORDER — MAGNESIUM SULFATE 4 GM/100ML IV SOLN
4.0000 g | Freq: Once | INTRAVENOUS | Status: AC
  Administered 2016-10-17: 4 g via INTRAVENOUS

## 2016-10-17 MED ORDER — VASOPRESSIN 20 UNIT/ML IV SOLN
INTRAVENOUS | Status: DC | PRN
  Administered 2016-10-17 (×2): 2 [IU] via INTRAVENOUS

## 2016-10-17 MED ORDER — MORPHINE SULFATE (PF) 2 MG/ML IV SOLN: 1.0000 mg | INTRAVENOUS | Status: AC | PRN

## 2016-10-17 MED ORDER — SODIUM CHLORIDE 0.9% FLUSH
3.0000 mL | Freq: Two times a day (BID) | INTRAVENOUS | Status: DC
  Administered 2016-10-18 – 2016-10-24 (×8): 3 mL via INTRAVENOUS

## 2016-10-17 MED ORDER — BISACODYL 10 MG RE SUPP
10.0000 mg | Freq: Every day | RECTAL | Status: DC
  Administered 2016-10-18: 10 mg via RECTAL
  Filled 2016-10-17: qty 1

## 2016-10-17 MED ORDER — THROMBIN 20000 UNITS EX SOLR
CUTANEOUS | Status: AC
  Filled 2016-10-17: qty 20000

## 2016-10-17 MED ORDER — SODIUM CHLORIDE 0.9% FLUSH: 3.0000 mL | INTRAVENOUS | Status: DC | PRN

## 2016-10-17 MED ORDER — PERFLUTREN LIPID MICROSPHERE
INTRAVENOUS | Status: AC
  Filled 2016-10-17: qty 10

## 2016-10-17 MED ORDER — HEPARIN SODIUM (PORCINE) 1000 UNIT/ML IJ SOLN
INTRAMUSCULAR | Status: AC
  Filled 2016-10-17: qty 1

## 2016-10-17 MED ORDER — EPINEPHRINE PF 1 MG/ML IJ SOLN
INTRAVENOUS | Status: DC | PRN
  Administered 2016-10-17: 8 ug/min via INTRAVENOUS

## 2016-10-17 MED ORDER — LIDOCAINE HCL (CARDIAC) 20 MG/ML IV SOLN
INTRAVENOUS | Status: DC | PRN
  Administered 2016-10-17: 60 mg via INTRAVENOUS

## 2016-10-17 MED ORDER — CEFAZOLIN SODIUM-DEXTROSE 2-4 GM/100ML-% IV SOLN
INTRAVENOUS | Status: AC
  Filled 2016-10-17: qty 100

## 2016-10-17 MED ORDER — FENTANYL CITRATE (PF) 250 MCG/5ML IJ SOLN
INTRAMUSCULAR | Status: AC
  Filled 2016-10-17: qty 5

## 2016-10-17 MED ORDER — ASPIRIN 81 MG PO CHEW
324.0000 mg | CHEWABLE_TABLET | Freq: Every day | ORAL | Status: DC
  Filled 2016-10-17 (×2): qty 4

## 2016-10-17 MED ORDER — VANCOMYCIN HCL 10 G IV SOLR
1500.0000 mg | INTRAVENOUS | Status: AC
  Administered 2016-10-17: 1500 mg via INTRAVENOUS
  Filled 2016-10-17 (×2): qty 1500

## 2016-10-17 MED ORDER — VANCOMYCIN HCL 10 G IV SOLR
1500.0000 mg | INTRAVENOUS | Status: DC
  Filled 2016-10-17: qty 1500

## 2016-10-17 MED ORDER — 0.9 % SODIUM CHLORIDE (POUR BTL) OPTIME
TOPICAL | Status: DC | PRN
Start: 2016-10-17 — End: 2016-10-17
  Administered 2016-10-17 (×2): 1000 mL

## 2016-10-17 MED ORDER — DEXMEDETOMIDINE HCL IN NACL 200 MCG/50ML IV SOLN
0.0000 ug/kg/h | INTRAVENOUS | Status: DC
  Administered 2016-10-17: 0.1 ug/kg/h via INTRAVENOUS
  Administered 2016-10-18: 0.3 ug/kg/h via INTRAVENOUS
  Filled 2016-10-17: qty 50

## 2016-10-17 MED ORDER — ROCURONIUM BROMIDE 100 MG/10ML IV SOLN
INTRAVENOUS | Status: DC | PRN
  Administered 2016-10-17: 30 mg via INTRAVENOUS
  Administered 2016-10-17: 40 mg via INTRAVENOUS
  Administered 2016-10-17: 30 mg via INTRAVENOUS

## 2016-10-17 MED ORDER — DEXTROSE 5 % IV SOLN: 1.5000 g | INTRAVENOUS | Status: DC

## 2016-10-17 MED ORDER — ONDANSETRON HCL 4 MG/2ML IJ SOLN
INTRAMUSCULAR | Status: DC | PRN
  Administered 2016-10-17: 4 mg via INTRAVENOUS

## 2016-10-17 MED ORDER — DEXTROSE 5 % IV SOLN
1.5000 g | INTRAVENOUS | Status: AC
  Administered 2016-10-17: 750 mg via INTRAVENOUS
  Administered 2016-10-17: 1500 mg via INTRAVENOUS
  Filled 2016-10-17 (×2): qty 1.5

## 2016-10-17 MED ORDER — TRANEXAMIC ACID (OHS) PUMP PRIME SOLUTION
2.0000 mg/kg | INTRAVENOUS | Status: AC
  Administered 2016-10-17: 13.2 mL/h
  Filled 2016-10-17: qty 1.78

## 2016-10-17 MED ORDER — SODIUM CHLORIDE 0.9 % IV SOLN
30.0000 meq | Freq: Once | INTRAVENOUS | Status: AC
  Administered 2016-10-17: 30 meq via INTRAVENOUS
  Filled 2016-10-17: qty 15

## 2016-10-17 MED ORDER — DEXTROSE 5 % IV SOLN
750.0000 mg | INTRAVENOUS | Status: DC
  Filled 2016-10-17: qty 750

## 2016-10-17 MED ORDER — DIGOXIN 0.25 MG/ML IJ SOLN
0.5000 mg | INTRAMUSCULAR | Status: AC
  Administered 2016-10-17: 0.5 mg via INTRAVENOUS
  Filled 2016-10-17: qty 2

## 2016-10-17 MED ORDER — SODIUM CHLORIDE 0.9 % IV SOLN
250.0000 mL | INTRAVENOUS | Status: DC
  Administered 2016-10-18: 250 mL via INTRAVENOUS

## 2016-10-17 MED ORDER — CHLORHEXIDINE GLUCONATE 0.12% ORAL RINSE (MEDLINE KIT)
15.0000 mL | Freq: Two times a day (BID) | OROMUCOSAL | Status: DC
  Administered 2016-10-17 – 2016-10-23 (×12): 15 mL via OROMUCOSAL

## 2016-10-17 MED ORDER — CEFAZOLIN SODIUM-DEXTROSE 2-4 GM/100ML-% IV SOLN
2.0000 g | INTRAVENOUS | Status: AC
  Administered 2016-10-17: 2 g via INTRAVENOUS
  Filled 2016-10-17: qty 100

## 2016-10-17 MED ORDER — SODIUM CHLORIDE 0.9 % IV SOLN: Freq: Once | INTRAVENOUS | Status: DC

## 2016-10-17 MED ORDER — POTASSIUM CHLORIDE 2 MEQ/ML IV SOLN
80.0000 meq | INTRAVENOUS | Status: DC
  Filled 2016-10-17: qty 40

## 2016-10-17 MED ORDER — IOPAMIDOL (ISOVUE-370) INJECTION 76%
INTRAVENOUS | Status: DC | PRN
  Administered 2016-10-17: 30 mL via INTRAVENOUS

## 2016-10-17 MED ORDER — LIDOCAINE HCL (PF) 1 % IJ SOLN
INTRAMUSCULAR | Status: DC | PRN
  Administered 2016-10-17: 30 mL

## 2016-10-17 MED ORDER — HEPARIN (PORCINE) IN NACL 2-0.9 UNIT/ML-% IJ SOLN
INTRAMUSCULAR | Status: DC | PRN
  Administered 2016-10-17: 09:00:00

## 2016-10-17 MED ORDER — SODIUM CHLORIDE 0.9 % IV SOLN
INTRAVENOUS | Status: DC
  Filled 2016-10-17: qty 2.5

## 2016-10-17 MED ORDER — CALCIUM CHLORIDE 10 % IV SOLN
INTRAVENOUS | Status: DC | PRN
  Administered 2016-10-17 (×15): 200 mg via INTRAVENOUS

## 2016-10-17 MED ORDER — PROTAMINE SULFATE 10 MG/ML IV SOLN
INTRAVENOUS | Status: DC | PRN
  Administered 2016-10-17: 10 mg via INTRAVENOUS
  Administered 2016-10-17: 30 mg via INTRAVENOUS
  Administered 2016-10-17: 10 mg via INTRAVENOUS

## 2016-10-17 MED ORDER — DOCUSATE SODIUM 100 MG PO CAPS: 200.0000 mg | ORAL_CAPSULE | Freq: Every day | ORAL | Status: DC

## 2016-10-17 MED ORDER — TRANEXAMIC ACID 1000 MG/10ML IV SOLN
1.5000 mg/kg/h | INTRAVENOUS | Status: AC
  Administered 2016-10-17: 1.5 mg/kg/h via INTRAVENOUS
  Filled 2016-10-17: qty 25

## 2016-10-17 MED ORDER — HEPARIN SODIUM (PORCINE) 1000 UNIT/ML IJ SOLN
INTRAMUSCULAR | Status: DC | PRN
Start: 2016-10-17 — End: 2016-10-17
  Administered 2016-10-17: 10000 [IU] via INTRAVENOUS

## 2016-10-17 MED ORDER — MORPHINE SULFATE (PF) 2 MG/ML IV SOLN
2.0000 mg | INTRAVENOUS | Status: DC | PRN
  Administered 2016-10-17 – 2016-10-18 (×2): 4 mg via INTRAVENOUS
  Administered 2016-10-18 (×6): 2 mg via INTRAVENOUS
  Administered 2016-10-18: 4 mg via INTRAVENOUS
  Administered 2016-10-18 (×2): 2 mg via INTRAVENOUS
  Administered 2016-10-19: 4 mg via INTRAVENOUS
  Administered 2016-10-19 (×2): 2 mg via INTRAVENOUS
  Administered 2016-10-19: 4 mg via INTRAVENOUS
  Administered 2016-10-19: 2 mg via INTRAVENOUS
  Administered 2016-10-19 – 2016-10-20 (×2): 4 mg via INTRAVENOUS
  Administered 2016-10-20: 2 mg via INTRAVENOUS
  Administered 2016-10-21: 4 mg via INTRAVENOUS
  Administered 2016-10-21 – 2016-10-22 (×5): 2 mg via INTRAVENOUS
  Filled 2016-10-17 (×2): qty 2
  Filled 2016-10-17: qty 1
  Filled 2016-10-17 (×2): qty 2
  Filled 2016-10-17 (×2): qty 1
  Filled 2016-10-17 (×2): qty 2
  Filled 2016-10-17: qty 1
  Filled 2016-10-17: qty 2
  Filled 2016-10-17 (×8): qty 1
  Filled 2016-10-17: qty 2
  Filled 2016-10-17 (×2): qty 1

## 2016-10-17 MED ORDER — HEPARIN SODIUM (PORCINE) 1000 UNIT/ML IJ SOLN
INTRAMUSCULAR | Status: DC | PRN
  Administered 2016-10-17: 2000 [IU] via INTRAVENOUS

## 2016-10-17 MED ORDER — IOPAMIDOL (ISOVUE-370) INJECTION 76%
INTRAVENOUS | Status: AC
  Filled 2016-10-17: qty 100

## 2016-10-17 MED ORDER — LACTATED RINGERS IV SOLN
INTRAVENOUS | Status: DC | PRN
  Administered 2016-10-17 (×5): via INTRAVENOUS

## 2016-10-17 MED ORDER — NOREPINEPHRINE BITARTRATE 1 MG/ML IV SOLN
0.0000 ug/min | INTRAVENOUS | Status: DC
  Administered 2016-10-17: 5 ug/min via INTRAVENOUS
  Administered 2016-10-18: 8 ug/min via INTRAVENOUS
  Administered 2016-10-18: 7 ug/min via INTRAVENOUS
  Administered 2016-10-18: 8 ug/min via INTRAVENOUS
  Filled 2016-10-17 (×5): qty 4

## 2016-10-17 MED ORDER — METOPROLOL TARTRATE 25 MG PO TABS: 25.0000 mg | ORAL_TABLET | Freq: Two times a day (BID) | ORAL | Status: DC

## 2016-10-17 MED ORDER — METOPROLOL TARTRATE 25 MG/10 ML ORAL SUSPENSION: 12.5000 mg | Freq: Two times a day (BID) | ORAL | Status: DC

## 2016-10-17 MED ORDER — THROMBIN 20000 UNITS EX SOLR
CUTANEOUS | Status: DC | PRN
  Administered 2016-10-17: 12 mL via TOPICAL
  Administered 2016-10-17: 8 mL via TOPICAL

## 2016-10-17 MED ORDER — LACTATED RINGERS IV SOLN
INTRAVENOUS | Status: DC
  Administered 2016-10-17: 10 mL/h via INTRAVENOUS

## 2016-10-17 MED ORDER — FENTANYL CITRATE (PF) 100 MCG/2ML IJ SOLN
INTRAMUSCULAR | Status: DC | PRN
  Administered 2016-10-17 (×2): 50 ug via INTRAVENOUS

## 2016-10-17 MED ORDER — PROTAMINE SULFATE 10 MG/ML IV SOLN
INTRAVENOUS | Status: DC | PRN
  Administered 2016-10-17: 300 mg via INTRAVENOUS

## 2016-10-17 MED ORDER — ONDANSETRON HCL 4 MG/2ML IJ SOLN
4.0000 mg | Freq: Four times a day (QID) | INTRAMUSCULAR | Status: DC | PRN
  Administered 2016-10-28: 4 mg via INTRAVENOUS
  Filled 2016-10-17: qty 2

## 2016-10-17 MED ORDER — LACTATED RINGERS IV SOLN
INTRAVENOUS | Status: DC | PRN
  Administered 2016-10-17: 10:00:00 via INTRAVENOUS

## 2016-10-17 MED ORDER — ORAL CARE MOUTH RINSE
15.0000 mL | Freq: Four times a day (QID) | OROMUCOSAL | Status: DC
  Administered 2016-10-18 – 2016-10-23 (×22): 15 mL via OROMUCOSAL

## 2016-10-17 MED ORDER — SODIUM CHLORIDE 0.45 % IV SOLN: INTRAVENOUS | Status: DC | PRN

## 2016-10-17 MED ORDER — PROPOFOL 10 MG/ML IV BOLUS
INTRAVENOUS | Status: DC | PRN
  Administered 2016-10-17: 120 mg via INTRAVENOUS

## 2016-10-17 MED ORDER — NITROGLYCERIN IN D5W 200-5 MCG/ML-% IV SOLN
2.0000 ug/min | INTRAVENOUS | Status: DC
  Filled 2016-10-17: qty 250

## 2016-10-17 MED ORDER — HEPARIN (PORCINE) IN NACL 2-0.9 UNIT/ML-% IJ SOLN
INTRAMUSCULAR | Status: AC
  Filled 2016-10-17: qty 1000

## 2016-10-17 MED ORDER — CEFUROXIME SODIUM 1.5 G IJ SOLR
1.5000 g | Freq: Two times a day (BID) | INTRAMUSCULAR | Status: AC
  Administered 2016-10-17 – 2016-10-19 (×4): 1.5 g via INTRAVENOUS
  Filled 2016-10-17 (×4): qty 1.5

## 2016-10-17 MED ORDER — MAGNESIUM SULFATE 50 % IJ SOLN
40.0000 meq | INTRAMUSCULAR | Status: DC
  Filled 2016-10-17: qty 10

## 2016-10-17 MED FILL — Sodium Chloride IV Soln 0.9%: INTRAVENOUS | Qty: 6000 | Status: AC

## 2016-10-17 MED FILL — Electrolyte-R (PH 7.4) Solution: INTRAVENOUS | Qty: 4000 | Status: AC

## 2016-10-17 MED FILL — Heparin Sodium (Porcine) Inj 1000 Unit/ML: INTRAMUSCULAR | Qty: 10 | Status: AC

## 2016-10-17 MED FILL — Calcium Chloride Inj 10%: INTRAVENOUS | Qty: 10 | Status: AC

## 2016-10-17 MED FILL — Mannitol IV Soln 20%: INTRAVENOUS | Qty: 500 | Status: AC

## 2016-10-17 MED FILL — Sodium Bicarbonate IV Soln 8.4%: INTRAVENOUS | Qty: 150 | Status: AC

## 2016-10-17 SURGICAL SUPPLY — 56 items
ATRICLIP EXCLUSION 45 FLEX HDL (Clip) ×3 IMPLANT
BLADE CORE FAN STRYKER (BLADE) ×3 IMPLANT
BLADE STERNUM SYSTEM 6 (BLADE) ×3 IMPLANT
CANN PRFSN 3/8X14X24FR PCFC (MISCELLANEOUS) ×1
CANNULA PRFSN 3/8X14X24FR PCFC (MISCELLANEOUS) ×1 IMPLANT
CANNULA RT ANGLE VENOUS 32FR (CANNULA) ×2 IMPLANT
CANNULA SUMP PERICARDIAL (CANNULA) ×3 IMPLANT
CANNULA VEN MTL TIP RT (MISCELLANEOUS) ×2
CANNULAE RT ANGLE VENOUS 32FR (CANNULA) ×6
CATH ROBINSON RED A/P 18FR (CATHETERS) ×3 IMPLANT
CATH THORACIC 36FR (CATHETERS) ×3 IMPLANT
CATH THORACIC 36FR RT ANG (CATHETERS) ×3 IMPLANT
CONN 1/2X1/2X1/2  BEN (MISCELLANEOUS) ×2
CONN 1/2X1/2X1/2 BEN (MISCELLANEOUS) ×1 IMPLANT
CONN 3/8X1/2 ST GISH (MISCELLANEOUS) ×6 IMPLANT
DRAPE CARDIOVASCULAR INCISE (DRAPES) ×2
DRAPE SLUSH/WARMER DISC (DRAPES) ×3 IMPLANT
DRAPE SRG 135X102X78XABS (DRAPES) ×1 IMPLANT
DRSG AQUACEL AG ADV 3.5X14 (GAUZE/BANDAGES/DRESSINGS) ×3 IMPLANT
DRSG COVADERM 4X14 (GAUZE/BANDAGES/DRESSINGS) ×3 IMPLANT
ELECT REM PT RETURN 9FT ADLT (ELECTROSURGICAL) ×6
ELECTRODE REM PT RTRN 9FT ADLT (ELECTROSURGICAL) ×2 IMPLANT
FELT TEFLON 1X6 (MISCELLANEOUS) ×3 IMPLANT
GLOVE BIOGEL PI IND STRL 6.5 (GLOVE) ×2 IMPLANT
GLOVE BIOGEL PI IND STRL 7.0 (GLOVE) ×3 IMPLANT
GLOVE BIOGEL PI INDICATOR 6.5 (GLOVE) ×4
GLOVE BIOGEL PI INDICATOR 7.0 (GLOVE) ×6
GOWN STRL REUS W/ TWL LRG LVL3 (GOWN DISPOSABLE) ×6 IMPLANT
GOWN STRL REUS W/ TWL XL LVL3 (GOWN DISPOSABLE) ×2 IMPLANT
GOWN STRL REUS W/TWL LRG LVL3 (GOWN DISPOSABLE) ×12
GOWN STRL REUS W/TWL XL LVL3 (GOWN DISPOSABLE) ×4
HEMOSTAT POWDER SURGIFOAM 1G (HEMOSTASIS) ×6 IMPLANT
INSERT FOGARTY XLG (MISCELLANEOUS) ×3 IMPLANT
KIT CATH CPB BARTLE (MISCELLANEOUS) ×3 IMPLANT
KIT SUCTION CATH 14FR (SUCTIONS) ×3 IMPLANT
LINE VENT (MISCELLANEOUS) ×3 IMPLANT
PACK OPEN HEART (CUSTOM PROCEDURE TRAY) ×3 IMPLANT
SET CARDIOPLEGIA MPS 5001102 (MISCELLANEOUS) ×3 IMPLANT
SPONGE GAUZE 4X4 12PLY STER LF (GAUZE/BANDAGES/DRESSINGS) ×3 IMPLANT
SPONGE LAP 18X18 X RAY DECT (DISPOSABLE) ×9 IMPLANT
SUT ETHIBOND NAB MH 2-0 36IN (SUTURE) ×6 IMPLANT
SUT PROLENE 3 0 SH 1 (SUTURE) ×9 IMPLANT
SUT PROLENE 3 0 SH DA (SUTURE) ×6 IMPLANT
SUT PROLENE 3 0 SH1 36 (SUTURE) ×3 IMPLANT
SUT PROLENE 4 0 SH DA (SUTURE) ×6 IMPLANT
SUT PROLENE 6 0 C 1 30 (SUTURE) ×9 IMPLANT
SUT STEEL 6MS V (SUTURE) ×6 IMPLANT
SUT VIC AB 1 CTX 36 (SUTURE) ×4
SUT VIC AB 1 CTX36XBRD ANBCTR (SUTURE) ×2 IMPLANT
SUTURE E-PAK OPEN HEART (SUTURE) ×3 IMPLANT
SYR 50ML LL SCALE MARK (SYRINGE) ×6 IMPLANT
SYR 50ML SLIP (SYRINGE) ×3 IMPLANT
TAPE CLOTH SURG 4X10 WHT LF (GAUZE/BANDAGES/DRESSINGS) ×3 IMPLANT
TRAY FOLEY IC TEMP SENS 16FR (CATHETERS) ×3 IMPLANT
TUBE CONNECTING 12'X1/4 (SUCTIONS) ×1
TUBE CONNECTING 12X1/4 (SUCTIONS) ×2 IMPLANT

## 2016-10-17 SURGICAL SUPPLY — 17 items
BLANKET WARM UNDERBOD FULL ACC (MISCELLANEOUS) ×3 IMPLANT
CATH DIAG 6FR PIGTAIL (CATHETERS) ×3 IMPLANT
EVACUATOR 1/8 PVC DRAIN (DRAIN) ×3 IMPLANT
KIT HEART LEFT (KITS) ×3 IMPLANT
NEEDLE BAYLIS TRANSSEPTAL 71CM (NEEDLE) ×3 IMPLANT
PACK CARDIAC CATHETERIZATION (CUSTOM PROCEDURE TRAY) ×3 IMPLANT
PAD DEFIB LIFELINK (PAD) ×3 IMPLANT
PERIVAC PERICARDIOCENTESIS 8.3 (TRAY / TRAY PROCEDURE) ×3 IMPLANT
SHEATH INTRO CHECKFLO 16F 13 (SHEATH) ×1 IMPLANT
SHEATH INTRO CHECKFLO 16F 13CM (SHEATH) ×2
SHEATH PINNACLE 8F 10CM (SHEATH) ×3 IMPLANT
SHEATH SWARTZ TS SL2 63CM 8.5F (SHEATH) ×3 IMPLANT
SHIELD RADPAD SCOOP 12X17 (MISCELLANEOUS) ×3 IMPLANT
TUBING CIL FLEX 10 FLL-RA (TUBING) ×3 IMPLANT
WATCHMAN ACCESS DOUBLE CURVE (SHEATH) ×3 IMPLANT
WATCHMAN CLOSURE 27MM (Prosthesis & Implant Heart) ×3 IMPLANT
WIRE AMPLATZ WHISKJ .035X260CM (WIRE) ×3 IMPLANT

## 2016-10-17 NOTE — CV Procedure (Signed)
Intra-operative Transesophageal Echocardiography Report:  [name] is a 81 year old female who was brought  to the operating room for emergency median sternotomy and repair of a cardiac perforation following an attempted Watchman procedure.   The patient had previously been intubated and had a transesophageal echocardiography probe already inserted.  Impression:  1. Pericardium: There was a large collection of pericardial fluid and clot which was circumferential but most prominent in the exterior regions of the pericardium.  1. Aortic valve: The aortic valve was trileaflet. The leaflets were mildly thickened but opened normally without restriction. There was trace aortic insufficiency.  2. Mitral valve: Mitral leaflets appeared to open without restriction. There was trace to 1+ mitral insufficiency.  3.Left ventricle: The left ventricular cavity was markedly underfilled and there was severe tachycardia. The patient was in atrial fibrillation there were no obvious regional wall motion abnormalities and there was vigorous left ventricular contractility.  4. Right ventricle: The right ventricular cavity was underfilled. There was reduced contractility of the right ventricular free wall.  5. Interatrial septum: The interatrial septum was bowed from right to left. Was no obvious in iatrogenic atrial septal defect noted.  6. Left atrium: Left atrial cavity was underfilled. There was a watchman device noted in the left atrial appendage. This was removed under TEE guidance.   Post-bypass findings:  1. Percardium: The collection of pericardial fluid and blood had been removed. There was only a trace trace amount of residual pericardial fluid noted.  2. Aortic valve: The aortic valve was unchanged from the pre-bypass study. There was trace aortic insufficiency.  2. Mitral valve: There was trace mitral insufficiency. The leaflets appeared to coapt adequately.  4. Left ventricle: There appeared  to be adequate contractility in all segments interrogated and the ejection fraction was estimated at 55%.  5. Right ventricle: The right ventricular cavity was enlarged. There was reduced contractility of the RV free wall.  6. Tricuspid Valve: There was   2-3+ tricuspid insufficiency with a central jet.  6. Left atrium: Left atrial cavity was markedly enlarged. The left atrial appendage could not be visualized.  Julie Broodavid Jayliah [name]

## 2016-10-17 NOTE — Progress Notes (Signed)
Anesthesiology Note:  81 year old female brought to the OR emergency sternotomy and exploration for pericardial  cardiac tamponade and perforation of L. Atrium following Watchman Procedure.   Patient arrived in OR hemodynamically unstable. required  Required chest compressions on two occasions. In the OR, I attempted to insert a right internal jugular central line with ultrasound guidance. The vein was markedly dilated and I was unable to thread the guidewire > 15 cm. Upon removing the needle, a large R. neck hematoma rapidly developed. Patient found to be markedly coagulopathic on eliquis PT/INR 40.8/4.1,  platelets 108,000.  Impression: Large neck hematoma following attempted R. Internal jugular  central line insertion either secondary to venipuncture or carotid puncture.   Plan:  1. Monitor hematoma for further expansion. 2. Treat coagulopathy as needed 3. Will follow and standby as needed at extubation when hematoma and airway edema improved.  [name]

## 2016-10-17 NOTE — Brief Op Note (Signed)
10/25/2016  2:37 PM  PATIENT:  Julie AmmonsJoanne S Yung  81 y.o. female  PRE-OPERATIVE DIAGNOSIS:  Tamponade   POST-OPERATIVE DIAGNOSIS:  Tamponade and perforation of Left Atrium  PROCEDURE:  Procedure(s): STERNOTOMY - Repair of Left Atrium (N/A) CLIPPING OF ATRIAL APPENDAGE (N/A)  SURGEON:  Surgeon(s) and Role:    * Alleen BorneBryan K Abbagale Goguen, MD - Primary  PHYSICIAN ASSISTANT: none  ASSISTANTS: Allyssa Hegarty, RNFA  ANESTHESIA:   general  EBL:  Total I/O In: [id number] [I.V.:9350; Blood:5800; IV Piggyback:750] Out: 300 [Urine:300]    DRAINS: 2 36 F mediastinal Chest Tube(s) in the posterior pericardium and sub-sternal   LOCAL MEDICATIONS USED:  NONE  SPECIMEN:  No Specimen  DISPOSITION OF SPECIMEN:  N/A  COUNTS:  YES  TOURNIQUET:  * No tourniquets in log *  DICTATION: .Note written in EPIC  PLAN OF CARE: Admit to inpatient   PATIENT DISPOSITION:  ICU - intubated and critically ill.   Delay start of Pharmacological VTE agent (>24hrs) due to surgical blood loss or risk of bleeding: yes

## 2016-10-17 NOTE — Progress Notes (Signed)
  Echocardiogram Echocardiogram Transesophageal has been performed.  [name], [name] R 10/15/2016, 2:40 PM

## 2016-10-17 NOTE — Op Note (Signed)
[address]       [place] [id number]             [phone]      CARDIOVASCULAR SURGERY OPERATIVE NOTE  10/23/2016 Vernie AmmonsJoanne S Tukes [id number]  Surgeon:  Alleen BorneBryan K. Bartle, MD  First Assistant: Ezequiel EssexAllyssa Hegarty, RNFA   Preoperative Diagnosis:  Severe aortic stenosis   Postoperative Diagnosis:  Same   Procedure:  1. Emergent Median Sternotomy 2. Extracorporeal circulation 3.   Evacuation of large hemopericardium with tamponade 4.   Repair of left atrial perforation 5.   Clipping of left atrial appendage using a 45 mm Atricure Atriclip.  Anesthesia:  General Endotracheal   Clinical History/Surgical Indication:  The patient is a 81 year old woman with a history of permanent atrial fibrillation on Eliquis, diabetes, hypertension, hyperlipidemia and obesity who had a GI bleed on Eliquis in 02/2014 and recurrent GI bleeding off anticoagulants. She was undergoing insertion of a Watchman device in the LAA this morning and suddenly developed profound hypotension with a large pericardial effusion and tamponade on echo. I was called to the cath lab emergently. Dr. Johney FrameAllred and Dr. Excell Seltzerooper were able to insert a pigtail catheter into the pericardium and evacuate the blood but she continued to bleed at a high rate with hemodynamic instability despite continuous aspiration of the pericardial space. It was felt that emergent sternotomy for evacuation of the hemothorax and repair of the bleeding site was indicated. OR 16 was immediately available and was prepared.   Preparation:  The patient was brought to the OR in hemodynamically unstable condition and a left femoral venous line was inserted by anesthesia. A right internal jugular line insertion was attempted but could not be inserted and the patient developed a large hematoma due to the high CVP from tamponade. After prepping and draping the patient she lost her BP transiently and required brief CPR and ongoing volume and  inotopic/vasopressor resuscitation with return of a BP.  The neck, chest, abdomen, and both groins were prepped with betadine soap and solution and draped in the usual sterile manner. A surgical time-out was taken and the correct patient and operative procedure were confirmed with the nursing and anesthesia staff.   Pre-bypass TEE:   Complete TEE assessment was performed by Dr. Noreene LarssonJoslin. Initially this showed a small residual pericardial effusion with continuous aspiration of the catheter. After the patient lost her BP it showed a large posterior pericardial effusion with tamponade.    Post-bypass TEE:   Left ventricular function preserved. mild mitral regurgitation. The left atrial appendage is occluded with the clip.   Cardiopulmonary Bypass:  A median sternotomy was performed. The pericardium was opened in the midline. There was a large amount of blood in the pericardium that was evacuated with return of a good BP. Right ventricular function appeared normal. The ascending aorta was of normal size and had no palpable plaque. There were no contraindications to aortic cannulation or cross-clamping. The patient was fully systemically heparinized and the ACT was maintained > 400 sec. The proximal aortic arch was cannulated with a 20 F aortic cannula for arterial inflow. Venous cannulation was performed via the right atrial appendage using a two-staged venous cannula.  Systemic rewarming to 37 degrees Centigrade was started.   Repair of left atrial perforation:  Once the patient was on bypass the LAA was exposed. There was a perforation in the crotch between the two lobes of the LAA with the tip of the  Watchman device protruding slightly through the perforation. The device was then removed through the delivery sheath by Dr. Excell Seltzerooper. The perforation was closed using two 3-0 prolene pledgetted mattress sutures.  Clipping of left atrial appendage:  The base of the LAA was measured and a 45 mm  Atricure Atriclip was selected. It was placed over the LAA so that it was lying at the base of the appendage and then it was closed. The handle was removed. There appeared to be optimal occlusion.   Completion:  The patient was rewarmed to 37 degrees Centigrade.  The patient was weaned from CPB without difficulty on no inotropes. CPB time was 47 minutes.  Heparin was fully reversed with protamine and the aortic and venous cannulas removed. Hemostasis was achieved with difficulty due to being on Eliquis and the coagulopathy from massive bleeding and blood replacement. This required multiple units of PRBC's, Cryoprecipitate, platelets and a dose of KCENTRA.  Mediastinal drainage tubes were placed. The sternum was closed with  #6 stainless steel wires. The fascia was closed with continuous # 1 vicryl suture. The subcutaneous tissue was closed with 2-0 vicryl continuous suture. The skin was closed with 3-0 vicryl subcuticular suture. All sponge, needle, and instrument counts were reported correct at the end of the case. Dry sterile dressings were placed over the incisions and around the chest tubes which were connected to pleurevac suction. The patient was then transported to the surgical intensive care unit in critical but stable condition.

## 2016-10-17 NOTE — CV Procedure (Signed)
Chest Tube Insertion Procedure Note  Indications:  Clinically significant Effusion  Pre-operative Diagnosis: Effusion  Post-operative Diagnosis: Effusion  Procedure Details  Informed consent was obtained for the procedure, including sedation.  Risks of lung perforation, hemorrhage, arrhythmia, and adverse drug reaction were discussed.   After sterile skin prep, using standard technique, a 28 French tube was placed in the right antero- lateral 7th rib space.  Findings: 150 ml of serosanguinous fluid obtained  Estimated Blood Loss:  Minimal         Specimens:  None              Complications:  None; patient tolerated the procedure well.         Disposition: performed at bedside in the ICU         Condition: stable  Attending Attestation: I performed the procedure.

## 2016-10-17 NOTE — H&P (Signed)
    INTERVAL PROCEDURE H&P  History and Physical Interval Note:  11/07/2016 7:44 AM  [name] has presented today for their planned procedure. The various methods of treatment have been discussed with the patient and family. After consideration of risks, benefits and other options for treatment, the patient has consented to the procedure.  The patients' outpatient history has been reviewed, patient examined, and no change in status from most recent office note within the past 30 days. I have reviewed the patients' chart and labs and will proceed as planned. Questions were answered to the patient's satisfaction.   Julie NoseKenneth C. Crixus Mcaulay, MD, [facility] Center[facility]ACC Attending Cardiologist [facility]  Julie NoseKenneth C Yareni Creps 11/09/2016, 7:44 AM

## 2016-10-17 NOTE — H&P (Signed)
CC: afib   History of Present Illness: [name] is a 81 y.o. female who presents today for implantation of a left atrial appendage occluder.  She has permanent atrial fibrillation as well as diabetes, obesity, hypertension, and hyperlipidemia.  She was previously on Eliquis but developed a GI bleed in April of 2015.  At that time, she was found to have gastritis, diverticulosis, and polyps requiring removal.  She then had recurrent GI bleeding off OAC in May of 2016.  Her GI physicians have advised to not be on OAC going forward. The patient has been evaluated by their referring physician and is felt to be a poor candidate for long term OAC due to GI bleeding requiring transfusion.  Previously, TEE (9/17) has revealed LAA thrombus.  She has been treated with therapeutic anticoagulation since that time.  She reports compliance with eliquis without interruption.  Today, she denies symptoms of palpitations, chest pain, shortness of breath, orthopnea, PND, lower extremity edema, claudication, dizziness, presyncope, syncope, bleeding, or neurologic sequela. The patient is tolerating medications without difficulties and is otherwise without complaint today.         Past Medical History  Diagnosis Date  . Personal history of colonic polyps   . Chronic a-fib (HCC)     a. CHA2DS2VASc = 5 (HTN, age > 575, DM2, female);  b. Eliquis d/c'd 4/015 in setting of BRBPR/GIB;  c. Rate-control w/ BB.  . Type II or unspecified type diabetes mellitus without mention of complication, not stated as uncontrolled   . Obesity, unspecified   . Hyperlipidemia   . HTN (hypertension)   . Cellulitis and abscess of leg, except foot   . Plantar fascial fibromatosis   . Diverticulosis   . RBBB   . GIB (gastrointestinal bleeding)     a. 02/2014 BRBPR;  b. 02/2014 Colonoscopy/EGD: diverticulosis with polys/polypectomy - ascending colon, Gastritis on EDG.  . Gastritis     a. 02/2014 EGD.  Marland Kitchen. Anemia      a. 02/2014 in setting of GIB req 3U PRBC's.   Past Surgical History  Procedure Laterality Date  . No past surgeries    . Peripheral vascular catheterization N/A 03/15/2015    Procedure: Visceral Angiography;  Surgeon: Annice NeedyJason S Dew, MD;  Location: [facility] INVASIVE CV LAB;  Service: Cardiovascular;  Laterality: N/A;  . Embolization N/A 03/15/2015    Procedure: Embolization;  Surgeon: Annice NeedyJason S Dew, MD;  Location: [facility] INVASIVE CV LAB;  Service: Cardiovascular;  Laterality: N/A;           Current Outpatient Prescriptions  Medication Sig Dispense Refill  Medications reviewed  Current Facility-Administered Medications:  .  0.45 % sodium chloride infusion, , Intravenous, Continuous, Amber K Seiler, NP .  0.9 %  sodium chloride infusion, , Intravenous, Continuous, Amber Caryl BisK Seiler, NP .  ceFAZolin (ANCEF) IVPB 2g/100 mL premix, 2 g, Intravenous, to SS-Proc, Marily LenteAmber K Seiler, NP   Allergies:   Diltiazem hcl   Social History:  The patient  reports that she has never smoked. She has never used smokeless tobacco. She reports that she does not drink alcohol or use illicit drugs.   Family History:  The patient's family history includes Asthma in her father; Emphysema in her father; Heart attack in her father; Hypertension in her mother.    ROS:  Please see the history of present illness.   All other systems are reviewed and negative.    PHYSICAL EXAM: Vitals:   07-18-2016 0611  BP: (!) 147/96  Pulse: 78  Resp: 18  Temp: 97.7 F (36.5 C)   GEN: Obese, well nourished, well developed, in no acute distress  HEENT: normal  Neck: no JVD, carotid bruits, or masses Cardiac: iRRR; no murmurs, rubs, or gallops, 1+BLE edema, +bilateral compression hose Respiratory:  clear to auscultation bilaterally, normal work of breathing GI: soft, nontender, nondistended, + BS MS: no deformity or atrophy  Skin: warm and dry  Neuro:  Strength and sensation are intact Psych: euthymic mood, full  affect   Other studies Reviewed: Additional studies/ records that were reviewed today include: Dr Windell HummingbirdGollan's office notes, prior TEE and cardiac CT  ASSESSMENT AND PLAN:  1.  Permanent atrial fibrillation I have seen [name] is a 81 y.o. female who presents for a Watchman left atrial appendage closure device.  She has a history of permanent atrial fibrillation.  This patients CHA2DS2-VASc Score and unadjusted Ischemic Stroke Rate (% per year) is equal to 7.2 % stroke rate/year from a score of 5 which necessitates long term oral anticoagulation to prevent stroke. HasBled score is 4.  Modified Rankin Score is 0. Unfortunately, she is not felt to be a long term Warfarin candidate secondary to prior GI bleeding requiring transfusion.  She has had prior left atrial appendage thrombus for which we have previously cancelled her watchman implant.  She presents today for TEE to see if her LAA thrombus is resolved.  If so then we will proceed with the procedure. Procedural risks for the Watchman implant have been reviewed with the patient including a 1% risk of stroke, 2% risk of perforation, 0.1% risk of device embolization.     The patient understands that the ability to implant Watchman is dependent on results of the TEE and wishes to proceed.    2.  HTN Stable No change required today  3.  Obesity Weight loss encouraged   Hillis RangeJames Weaver Tweed MD, [facility] 10/27/2016 7:21 AM

## 2016-10-17 NOTE — Transfer of Care (Signed)
Immediate Anesthesia Transfer of Care Note  Patient: Vernie AmmonsJoanne S Balinski  Procedure(s) Performed: Procedure(s): LEFT ATRIAL APPENDAGE OCCLUSION (N/A) Pericardiocentesis (N/A)  Patient Location: OR 16  Anesthesia Type:General  Level of Consciousness: Patient remains intubated per anesthesia plan  Airway & Oxygen Therapy: Patient remains intubated per anesthesia plan and Patient placed on Ventilator (see vital sign flow sheet for setting)  Post-op Assessment: Report given to RN  Post vital signs: Reviewed  Last Vitals:  Vitals:   February 04, 2016 0611  BP: (!) 147/96  Pulse: 78  Resp: 18  Temp: 36.5 C    Last Pain:  Vitals:   February 04, 2016 0647  TempSrc:   PainSc: 6          Complications: emergency to OR

## 2016-10-17 NOTE — Progress Notes (Signed)
3314fr venous sheath aspirated and removed from right femoral vein. Manual pressure applied for 30 minutes, Hemostasis not achieved. Manual pressure reapplied and continued for additional 25 minutes. Hemostasis achieved. Groin level 0. No S+S of hematoma. Tegaderm dressing applied. Patient intubated and sedated.   Right dp and pt pulses easily palpable.  Hemostasis achieved at  16:50:00

## 2016-10-17 NOTE — OR Nursing (Signed)
SICU Calls:  First call at 11:30am by Julie PattenMarie T.; second call at 11:59 am; Third call at 12:12noon.          Julie MaywoodLeonila Anjel Perfetti,RN

## 2016-10-17 NOTE — CV Procedure (Signed)
TEE was performed intraprocedurally to assist the the placement of a Watchman LAA occluder device. This involves 2D, 3D and color doppler images, and quantitative measurements. Please see the full TEE report for details.  Nathanie Ottley C. Aurelio Mccamy, MD, FACC Attending Cardiologist [facility]   

## 2016-10-17 NOTE — Progress Notes (Signed)
Patient ID: Julie AmmonsJoanne S [name], female   DOB: [birth year], 81 y.o.   MRN: [id number]   SICU Evening Rounds:   Hemodynamically stable  Permanent atrial fib. Vent rate increased this pm into the 130-140 range.  On vent. Starting to open eyes a little but not following commands yet.  Urine output good  CT output low  CBC    Component Value Date/Time   WBC 19.8 (H) 03-01-16 1514   RBC 4.02 03-01-16 1514   HGB 12.4 03-01-16 1514   HGB 11.1 (L) 07/13/2014 1016   HCT 36.0 03-01-16 1514   HCT 34.5 (L) 07/13/2014 1016   PLT 113 (L) 03-01-16 1514   PLT 234 07/13/2014 1016   MCV 89.6 03-01-16 1514   MCV 99 07/13/2014 1016   MCH 30.8 03-01-16 1514   MCHC 34.4 03-01-16 1514   RDW 18.6 (H) 03-01-16 1514   RDW 19.8 (H) 08/01/2014 1431   RDW 21.1 (H) 07/13/2014 1016   LYMPHSABS 1.7 04/27/2015 1301   LYMPHSABS 1.3 07/13/2014 1016   MONOABS 0.2 04/27/2015 1301   MONOABS 0.4 07/13/2014 1016   EOSABS 0.0 04/27/2015 1301   EOSABS 0.2 07/13/2014 1016   BASOSABS 0.0 04/27/2015 1301   BASOSABS 0.1 07/13/2014 1016     BMET    Component Value Date/Time   NA 145 03-01-16 1506   NA 138 08/01/2014 1431   NA 143 02/21/2014 0011   K 3.1 (L) 03-01-16 1506   K 4.3 02/21/2014 0011   CL 105 03-01-16 1219   CL 111 (H) 02/21/2014 0011   CO2 23 03-01-16 0659   CO2 25 02/21/2014 0011   GLUCOSE 105 (H) 03-01-16 1506   GLUCOSE 119 (H) 02/21/2014 0011   BUN 8 03-01-16 1219   BUN 16 08/01/2014 1431   BUN 22 (H) 02/21/2014 0011   CREATININE 0.50 03-01-16 1219   CREATININE 1.01 02/21/2014 0011   CALCIUM 8.9 03-01-16 0659   CALCIUM 8.2 (L) 02/21/2014 0011   GFRNONAA >60 03-01-16 0659   GFRNONAA 54 (L) 02/21/2014 0011   GFRAA >60 03-01-16 0659   GFRAA >60 02/21/2014 0011     A/P:  Stable postop course. Continue current plans Postop CXR shows a moderate right pleural effusion so will insert a chest tube.

## 2016-10-17 NOTE — Transfer of Care (Signed)
Immediate Anesthesia Transfer of Care Note  Patient: [name]  Procedure(s) Performed: Procedure(s): STERNOTOMY - Repair of Left Atrium (N/A) CLIPPING OF ATRIAL APPENDAGE (N/A)  Patient Location: SICU  Anesthesia Type:General  Level of Consciousness: sedated and Patient remains intubated per anesthesia plan  Airway & Oxygen Therapy: Patient remains intubated per anesthesia plan and Patient placed on Ventilator (see vital sign flow sheet for setting)  Post-op Assessment: Report given to RN and Post -op Vital signs reviewed and stable  Post vital signs: Reviewed and stable  Last Vitals:  Vitals:   2015/12/08 1216 2015/12/08 1403  BP:  (!) 95/59  Pulse: (!) 0 81  Resp:  12  Temp:      Last Pain:  Vitals:   2015/12/08 0647  TempSrc:   PainSc: 6          Complications: No apparent anesthesia complications and cardiovascular complications

## 2016-10-17 NOTE — Anesthesia Preprocedure Evaluation (Signed)
Anesthesia Evaluation  Patient identified by MRN, date of birth, ID bandPreop documentation limited or incomplete due to emergent nature of procedure.  Airway        Dental   Pulmonary           Cardiovascular hypertension,      Neuro/Psych    GI/Hepatic   Endo/Other  diabetes  Renal/GU      Musculoskeletal   Abdominal   Peds  Hematology   Anesthesia Other Findings   Reproductive/Obstetrics                             Anesthesia Physical Anesthesia Plan  ASA: IV and emergent  Anesthesia Plan: General ETT   Post-op Pain Management:    Induction:   Airway Management Planned:   Additional Equipment:   Intra-op Plan:   Post-operative Plan:   Informed Consent:   Plan Discussed with:   Anesthesia Plan Comments:         Anesthesia Quick Evaluation

## 2016-10-18 ENCOUNTER — Inpatient Hospital Stay (HOSPITAL_COMMUNITY): Payer: Medicare Other

## 2016-10-18 DIAGNOSIS — T82599A Other mechanical complication of unspecified cardiac and vascular devices and implants, initial encounter: Secondary | ICD-10-CM

## 2016-10-18 LAB — PREPARE CRYOPRECIPITATE
UNIT DIVISION: 0
UNIT DIVISION: 0
Unit division: 0
Unit division: 0

## 2016-10-18 LAB — PREPARE FRESH FROZEN PLASMA
BLOOD PRODUCT EXPIRATION DATE: 201712122359
BLOOD PRODUCT EXPIRATION DATE: 201712122359
BLOOD PRODUCT EXPIRATION DATE: 201712122359
BLOOD PRODUCT EXPIRATION DATE: 201712122359
BLOOD PRODUCT EXPIRATION DATE: 201712122359
BLOOD PRODUCT EXPIRATION DATE: 201712122359
BLOOD PRODUCT EXPIRATION DATE: 201712122359
BLOOD PRODUCT EXPIRATION DATE: 201712162359
BLOOD PRODUCT EXPIRATION DATE: 201712162359
BLOOD PRODUCT EXPIRATION DATE: 201712162359
Blood Product Expiration Date: 201712112359
Blood Product Expiration Date: 201712122359
Blood Product Expiration Date: 201712122359
Blood Product Expiration Date: 201712122359
Blood Product Expiration Date: 201712122359
Blood Product Expiration Date: 201712122359
Blood Product Expiration Date: 201712122359
Blood Product Expiration Date: 201712122359
ISSUE DATE / TIME: 201712071025
ISSUE DATE / TIME: 201712071048
ISSUE DATE / TIME: 201712080423
ISSUE DATE / TIME: 201712080618
ISSUE DATE / TIME: 201712080854
ISSUE DATE / TIME: 201712081056
ISSUE DATE / TIME: 201712081354
ISSUE DATE / TIME: 201712081354
ISSUE DATE / TIME: 201712070946
ISSUE DATE / TIME: 201712070946
ISSUE DATE / TIME: 201712071025
ISSUE DATE / TIME: 201712071048
ISSUE DATE / TIME: 201712071623
ISSUE DATE / TIME: 201712071740
ISSUE DATE / TIME: 201712081354
UNIT TYPE AND RH: 600
UNIT TYPE AND RH: 6200
UNIT TYPE AND RH: 6200
UNIT TYPE AND RH: 6200
UNIT TYPE AND RH: 6200
UNIT TYPE AND RH: 6200
UNIT TYPE AND RH: 6200
UNIT TYPE AND RH: 6200
UNIT TYPE AND RH: 6200
UNIT TYPE AND RH: 6200
Unit Type and Rh: 6200
Unit Type and Rh: 6200
Unit Type and Rh: 6200
Unit Type and Rh: 6200
Unit Type and Rh: 6200
Unit Type and Rh: 6200
Unit Type and Rh: 6200
Unit Type and Rh: 6200

## 2016-10-18 LAB — GLUCOSE, CAPILLARY
GLUCOSE-CAPILLARY: 134 mg/dL — AB (ref 65–99)
GLUCOSE-CAPILLARY: 111 mg/dL — AB (ref 65–99)
GLUCOSE-CAPILLARY: 112 mg/dL — AB (ref 65–99)
GLUCOSE-CAPILLARY: 116 mg/dL — AB (ref 65–99)
GLUCOSE-CAPILLARY: 103 mg/dL — AB (ref 65–99)
GLUCOSE-CAPILLARY: 94 mg/dL (ref 65–99)
GLUCOSE-CAPILLARY: 108 mg/dL — AB (ref 65–99)
Glucose-Capillary: 126 mg/dL — ABNORMAL HIGH (ref 65–99)
Glucose-Capillary: 123 mg/dL — ABNORMAL HIGH (ref 65–99)
Glucose-Capillary: 120 mg/dL — ABNORMAL HIGH (ref 65–99)
Glucose-Capillary: 92 mg/dL (ref 65–99)
Glucose-Capillary: 101 mg/dL — ABNORMAL HIGH (ref 65–99)
Glucose-Capillary: 97 mg/dL (ref 65–99)
Glucose-Capillary: 103 mg/dL — ABNORMAL HIGH (ref 65–99)
Glucose-Capillary: 106 mg/dL — ABNORMAL HIGH (ref 65–99)
Glucose-Capillary: 118 mg/dL — ABNORMAL HIGH (ref 65–99)

## 2016-10-18 LAB — CBC
HCT: 32.5 % — ABNORMAL LOW (ref 36.0–46.0)
HEMATOCRIT: 34.9 % — AB (ref 36.0–46.0)
HEMOGLOBIN: 11.8 g/dL — AB (ref 12.0–15.0)
Hemoglobin: 11.3 g/dL — ABNORMAL LOW (ref 12.0–15.0)
MCH: 29.9 pg (ref 26.0–34.0)
MCH: 30.5 pg (ref 26.0–34.0)
MCHC: 33.8 g/dL (ref 30.0–36.0)
MCHC: 34.8 g/dL (ref 30.0–36.0)
MCV: 88.4 fL (ref 78.0–100.0)
MCV: 87.6 fL (ref 78.0–100.0)
PLATELETS: 133 10*3/uL — AB (ref 150–400)
Platelets: 125 10*3/uL — ABNORMAL LOW (ref 150–400)
RBC: 3.95 MIL/uL (ref 3.87–5.11)
RBC: 3.71 MIL/uL — AB (ref 3.87–5.11)
RDW: 19.8 % — ABNORMAL HIGH (ref 11.5–15.5)
RDW: 19.8 % — ABNORMAL HIGH (ref 11.5–15.5)
WBC: 15.5 10*3/uL — ABNORMAL HIGH (ref 4.0–10.5)
WBC: 14.5 10*3/uL — AB (ref 4.0–10.5)

## 2016-10-18 LAB — POCT I-STAT, CHEM 8
BUN: 16 mg/dL (ref 6–20)
CALCIUM ION: 1.22 mmol/L (ref 1.15–1.40)
CHLORIDE: 109 mmol/L (ref 101–111)
CREATININE: 1 mg/dL (ref 0.44–1.00)
GLUCOSE: 111 mg/dL — AB (ref 65–99)
HCT: 30 % — ABNORMAL LOW (ref 36.0–46.0)
Hemoglobin: 10.2 g/dL — ABNORMAL LOW (ref 12.0–15.0)
POTASSIUM: 3.7 mmol/L (ref 3.5–5.1)
Sodium: 139 mmol/L (ref 135–145)
TCO2: 22 mmol/L (ref 0–100)

## 2016-10-18 LAB — PREPARE PLATELET PHERESIS
UNIT DIVISION: 0
UNIT DIVISION: 0

## 2016-10-18 LAB — BASIC METABOLIC PANEL
Anion gap: 6 (ref 5–15)
BUN: 12 mg/dL (ref 6–20)
CHLORIDE: 112 mmol/L — AB (ref 101–111)
CO2: 23 mmol/L (ref 22–32)
Calcium: 8.2 mg/dL — ABNORMAL LOW (ref 8.9–10.3)
Creatinine, Ser: 0.94 mg/dL (ref 0.44–1.00)
GFR calc Af Amer: >60 mL/min (ref >60)
GFR calc non Af Amer: 56 mL/min — ABNORMAL LOW (ref >60)
Glucose, Bld: 112 mg/dL — ABNORMAL HIGH (ref 65–99)
POTASSIUM: 4 mmol/L (ref 3.5–5.1)
SODIUM: 141 mmol/L (ref 135–145)

## 2016-10-18 LAB — CREATININE, SERUM
CREATININE: 1.08 mg/dL — AB (ref 0.44–1.00)
GFR calc non Af Amer: 48 mL/min — ABNORMAL LOW (ref >60)
GFR, EST AFRICAN AMERICAN: 55 mL/min — AB (ref >60)

## 2016-10-18 LAB — POCT I-STAT 3, ART BLOOD GAS (G3+)
Acid-base deficit: 4 mmol/L — ABNORMAL HIGH (ref 0.0–2.0)
BICARBONATE: 21.3 mmol/L (ref 20.0–28.0)
O2 SAT: 98 %
PCO2 ART: 38.8 mmHg (ref 32.0–48.0)
TCO2: 23 mmol/L (ref 0–100)
pH, Arterial: 7.347 — ABNORMAL LOW (ref 7.350–7.450)
pO2, Arterial: 110 mmHg — ABNORMAL HIGH (ref 83.0–108.0)

## 2016-10-18 LAB — DIGOXIN LEVEL: DIGOXIN LVL: 0.8 ng/mL (ref 0.8–2.0)

## 2016-10-18 LAB — MAGNESIUM
MAGNESIUM: 2 mg/dL (ref 1.7–2.4)
Magnesium: 2 mg/dL (ref 1.7–2.4)

## 2016-10-18 MED ORDER — ALBUMIN HUMAN 5 % IV SOLN
12.5000 g | Freq: Once | INTRAVENOUS | Status: AC
  Administered 2016-10-18: 12.5 g via INTRAVENOUS
  Filled 2016-10-18: qty 250

## 2016-10-18 MED ORDER — CALCIUM CHLORIDE 10 % IV SOLN
1.0000 g | Freq: Once | INTRAVENOUS | Status: AC
  Administered 2016-10-18: 1 g via INTRAVENOUS
  Filled 2016-10-18: qty 10

## 2016-10-18 MED ORDER — INSULIN ASPART 100 UNIT/ML ~~LOC~~ SOLN
0.0000 [IU] | SUBCUTANEOUS | Status: DC
  Administered 2016-10-19 – 2016-10-25 (×21): 2 [IU] via SUBCUTANEOUS
  Administered 2016-10-25: 8 [IU] via SUBCUTANEOUS
  Administered 2016-10-26 – 2016-10-28 (×12): 2 [IU] via SUBCUTANEOUS

## 2016-10-18 NOTE — Procedures (Signed)
Extubation Procedure Note  Patient Details:   Name: Julie AmmonsJoanne S Godsil DOB: 04-Jan-1935 MRN: [id number]   Airway Documentation:     Evaluation  O2 sats: stable throughout Complications: No apparent complications Patient did tolerate procedure well. Bilateral Breath Sounds: Clear   Yes   Positive cuff leak, NIF - 24, VC 650.  Pt placed on nasal cannula 4Lpm with humidity, no stridor, pt able to get 375 using incentive spirometer.  Rayburn FeltJean S Orianna Biskup 10/18/2016, 3:46 PM

## 2016-10-18 NOTE — Progress Notes (Signed)
NIF -24 VC 620   RN aware.

## 2016-10-18 NOTE — Progress Notes (Signed)
Anesthesiology Follow-up:  Sedated on vent with precedex, opens eyes moving all extremities, following commands. Hemodynamically stable on levophed 5 mcg/kg/min with VVI pacing at 90 bpm. R. Chest tube placed last night for pleural effusion  VS: T-36.4 BP- 120/55 HR- 90 (VVI paced) O2 Sat 98%  Neck hematoma appears stable, soft   VENT; SIMV/PRVC Rate- 12 TV 440 FiO2 50%   Na- 141 K-4.0 BUN/Cr. 12/0.94 glucose- 112 H/H- 11.8/34.9 platelets- 125,000 Last PT/INR- 21.6/1.86 at 15:14 yesterday  CXR: bilateral pleural effusions with bilateral airspace disease.  Stable overall following emergency repair of L. Atrial appendage perforation. Neck hematoma, soft stable. When she is ready for extubation, anesthesiology can stand by if needed.  Kipp Broodavid Lavonda Thal

## 2016-10-18 NOTE — Progress Notes (Signed)
Pt awake, responds to voice, moves all extremities to command. Hemodynamically stable. Right chest tube placed last night. V-paced on telemetry.   Appreciate Dr Sharee PimpleBartle's care.   [name], [name] 10/18/2016 8:08 AM

## 2016-10-18 NOTE — Progress Notes (Signed)
Anesthesiology Follow-up:  Patient extubated this afternoon at 14:55. Alert, breathing unlabored, no stridor, complaining of sternotomy pain and palpitations.  VS: T-36.1 BP 139/63 HR- 120 (Afib) RR 12 02 sat-100% on 4L   Tolerating extubation, presently in rapid afib, otherwise stable.  [name] [name]

## 2016-10-18 NOTE — Progress Notes (Signed)
CT surgery p.m. Rounds  Patient sitting up in bed extubated after emergency repair of left atrial appendage injury yesterday with tamponade Sinus rhythm stable hemodynamics receiving low-dose Neo-Synephrine Hematocrit this p.m. 30 Continue current care

## 2016-10-18 NOTE — Anesthesia Postprocedure Evaluation (Signed)
Anesthesia Post Note  Patient: [name]  Procedure(s) Performed: Procedure(s) (LRB): STERNOTOMY - Repair of Left Atrium (N/A) CLIPPING OF ATRIAL APPENDAGE (N/A)  Patient location during evaluation: SICU Anesthesia Type: General Level of consciousness: awake and alert, oriented and patient cooperative Vital Signs Assessment: post-procedure vital signs reviewed and stable Respiratory status: spontaneous breathing Comments: Asked by DR Laneta SimmersBartle to assess for extubation on 10/18/2016. Nif -24, TV 600, positive leak test, patient alert, oriented, moving all extremities. Dr. Laneta SimmersBartle present and plans to extubate patient.    Last Vitals:  Vitals:   10/18/16 1152 10/18/16 1217  BP:  (!) 96/46  Pulse:  80  Resp:  14  Temp: 36.4 C     Last Pain:  Vitals:   10/18/16 0400  TempSrc: Oral  PainSc:                  De Nurseennie, Ermalee Mealy E

## 2016-10-18 NOTE — Progress Notes (Signed)
Pt core temp 35.5 (temp foley), correlating with axillary temp, but oral temp 36.4. Pt's skin cool to touch, so increased heat in room for now.  Will continue to monitor.

## 2016-10-18 NOTE — Progress Notes (Signed)
Dr. Donata ClayVan Trigt made aware of pt's hypotension despite levophed @ 8 mcg and decreased UOP. Verbal order received to administer one albumin with 1 g of calcium. Will implement and continue to monitor pt closely.

## 2016-10-18 NOTE — Progress Notes (Signed)
SUBJECTIVE: The patient is awake on the vent this morning and following commands. She denies pain or breathing issues.   CURRENT MEDICATIONS: . acetaminophen  1,000 mg Oral Q6H   Or  . acetaminophen (TYLENOL) oral liquid 160 mg/5 mL  1,000 mg Per Tube Q6H  . aspirin EC  325 mg Oral Daily   Or  . aspirin  324 mg Per Tube Daily  . bisacodyl  10 mg Oral Daily   Or  . bisacodyl  10 mg Rectal Daily  . cefUROXime (ZINACEF)  IV  1.5 g Intravenous Q12H  . chlorhexidine gluconate (MEDLINE KIT)  15 mL Mouth Rinse BID  . docusate sodium  200 mg Oral Daily  . insulin regular  0-10 Units Intravenous TID WC  . mouth rinse  15 mL Mouth Rinse QID  . metoprolol tartrate  25 mg Oral BID   Or  . metoprolol tartrate  25 mg Per Tube BID  . [START ON 10/19/2016] pantoprazole  40 mg Oral Daily  . sodium chloride flush  3 mL Intravenous Q12H   . sodium chloride 20 mL/hr at 10/18/16 0700  . sodium chloride 250 mL (10/18/16 0700)  . sodium chloride    . dexmedetomidine 0.3 mcg/kg/hr (10/18/16 0700)  . insulin (NOVOLIN-R) infusion 2.4 Units/hr (10/18/16 0700)  . lactated ringers 20 mL/hr at 10/18/16 0700  . lactated ringers 20 mL/hr at 10/18/16 0700  . nitroGLYCERIN Stopped (11/10/2016 2000)  . norepinephrine (LEVOPHED) Adult infusion 8 mcg/min (10/18/16 0700)  . phenylephrine (NEO-SYNEPHRINE) Adult infusion Stopped (10/26/2016 1500)    OBJECTIVE: Physical Exam: Vitals:   10/18/16 0630 10/18/16 0645 10/18/16 0700 10/18/16 0715  BP:   111/65   Pulse: 89 89 89 89  Resp: 15 12 15 14   Temp:      TempSrc:      SpO2: 99% 99% 99% 99%  Weight:      Height:        Intake/Output Summary (Last 24 hours) at 10/18/16 0736 Last data filed at 10/18/16 0700  Gross per 24 hour  Intake         18902.84 ml  Output             5645 ml  Net         13257.84 ml    Telemetry reveals atrial fibrillation with ventricular pacing; underlying atrial fibrillation in the 50-60's  GEN- The patient is ill  appearing, alert and oriented x 3 today.   Head- normocephalic, atraumatic Eyes-  Sclera clear, conjunctiva pink Ears- hearing intact Oropharynx- +ETT Neck- supple, +soft hematoma to right neck  Lungs- Clear to ausculation bilaterally, normal work of breathing, +chest tubes  Heart- Regular rate and rhythm (paced) GI- soft, NT, ND, + BS Extremities- no clubbing, cyanosis  Skin- no rash or lesion Psych- euthymic mood, full affect Neuro- strength and sensation are intact  LABS: Basic Metabolic Panel:  Recent Labs  11/02/2016 0659  10/14/2016 2010 10/18/2016 2047 10/18/16 0400  NA 138  < >  --  140 141  K 3.6  < >  --  4.1 4.0  CL 108  < >  --  112* 112*  CO2 23  --   --   --  23  GLUCOSE 94  < >  --  143* 112*  BUN 10  < >  --  12 12  CREATININE 0.83  < > 0.95 0.80 0.94  CALCIUM 8.9  --   --   --  8.2*  MG  --   --  2.1  --  2.0  < > = values in this interval not displayed. Liver Function Tests: No results for input(s): AST, ALT, ALKPHOS, BILITOT, PROT, ALBUMIN in the last 72 hours. No results for input(s): LIPASE, AMYLASE in the last 72 hours. CBC:  Recent Labs  11/01/2016 2010 11/10/2016 2047 10/18/16 0400  WBC 18.8*  --  15.5*  HGB 12.4 10.5* 11.8*  HCT 36.2 31.0* 34.9*  MCV 88.5  --  88.4  PLT 138*  --  125*   D-Dimer:  Recent Labs  10/27/2016 1054  DDIMER 4.51*    RADIOLOGY: Dg Chest Port 1 View Result Date: 10/11/2016 CLINICAL DATA:  Initial evaluation status post chest tube placement. EXAM: PORTABLE CHEST 1 VIEW COMPARISON:  Prior radiograph from earlier same day. FINDINGS: Patient remains intubated. Tip of the endotracheal tube positioned approximately 4.7 cm above the carina. Mediastinal drain in place. A left-sided chest tube overlying the medial left lung base is grossly stable in position. There has been interval placement of a right-sided chest tube, seen coiled at the right lung base. Tip and side hole likely lie within the bony thorax. Cardiomegaly is stable.   Mediastinal silhouette unchanged. Current examination has been performed with a similar degree of lung inflation. Moderate diffuse pulmonary edema, slightly worsened from previous. Moderate right pleural effusion with smaller left pleural effusion, relatively similar. Associated bibasilar opacities persist, grossly unchanged. No pneumothorax. Osseous structures unchanged. IMPRESSION: 1. Interval placement of right-sided chest tube, coiled at the right lung base. 2. Remaining support apparatus in stable and satisfactory position. 3. Similar cardiomegaly with worsened pulmonary edema. 4. Similar bilateral pleural effusions, right worse than left, with associated bibasilar opacities. Electronically Signed   By: Jeannine Boga M.D.   On: 10/26/2016 20:58   ASSESSMENT AND PLAN:  Active Problems:   Perforation of cardiac device  1.  Permanent atrial fibrillation S/p attempted Watchman placement 12/7. She developed profound hypotension during procedure with large pericardial effusion and tamponade. She was taken emergently to OR for sternotomy and repair of LAA. She also underwent LAA clipping at that time.  Awake and following commands this morning.   Appreciate TCTS' care.  Hopefully will be able to wean pressor support today.   Chanetta Marshall, NP 10/18/2016 7:36 AM   Thompson Grayer MD, [facility] 10/18/2016 9:47 AM

## 2016-10-18 NOTE — Progress Notes (Signed)
1 Day Post-Op Procedure(s) (LRB): STERNOTOMY - Repair of Left Atrium (N/A) CLIPPING OF ATRIAL APPENDAGE (N/A) Subjective:  Intubated on Precedex    Objective: Vital signs in last 24 hours: Temp:  [97.4 F (36.3 C)-97.5 F (36.4 C)] 97.5 F (36.4 C) (12/08 0400) Pulse Rate:  [0-177] 89 (12/08 0715) Cardiac Rhythm: Ventricular paced (12/08 0400) Resp:  [0-29] 14 (12/08 0715) BP: (94-152)/(51-99) 111/65 (12/08 0700) SpO2:  [0 %-100 %] 99 % (12/08 0715) Arterial Line BP: (102-174)/(43-88) 119/48 (12/08 0715) FiO2 (%):  [50 %] 50 % (12/08 0400) Weight:  [105.2 kg (231 lb 14.8 oz)] 105.2 kg (231 lb 14.8 oz) (12/08 0500)  Hemodynamic parameters for last 24 hours:    Intake/Output from previous day: 12/07 0701 - 12/08 0700 In: 18902.8 [I.V.:10615; Blood:5800; IV Piggyback:2487.8] Out: 16105645 [Urine:1075; Blood:3000; Chest Tube:1570] Intake/Output this shift: No intake/output data recorded.  General appearance: intubated and sedated but awakens and follows commands Neurologic: intact Heart: regular rate and rhythm, S1, S2 normal, no murmur, click, rub or gallop Lungs: fairly clear Extremities: edema mild anasarca Wound: Aquacel in place over sternotomy  Lab Results:  Recent Labs  10/20/2016 2010 10/16/2016 2047 10/18/16 0400  WBC 18.8*  --  15.5*  HGB 12.4 10.5* 11.8*  HCT 36.2 31.0* 34.9*  PLT 138*  --  125*   BMET:  Recent Labs  10/20/2016 0659  11/03/2016 2047 10/18/16 0400  NA 138  < > 140 141  K 3.6  < > 4.1 4.0  CL 108  < > 112* 112*  CO2 23  --   --  23  GLUCOSE 94  < > 143* 112*  BUN 10  < > 12 12  CREATININE 0.83  < > 0.80 0.94  CALCIUM 8.9  --   --  8.2*  < > = values in this interval not displayed.  PT/INR:  Recent Labs  11/03/2016 1514  LABPROT 21.6*  INR 1.86   ABG    Component Value Date/Time   PHART 7.268 (L) 11/10/2016 1454   HCO3 20.8 10/15/2016 1454   TCO2 22 10/26/2016 2047   ACIDBASEDEF 7.0 (H) 10/16/2016 1454   O2SAT 95.0 10/16/2016  1454   CBG (last 3)   Recent Labs  10/18/16 0603 10/18/16 0650 10/18/16 0750  GLUCAP 116* 120* 103*   CXR: bilateral lower lobe atelectasis/opacity in bases Assessment/Plan: S/P Procedure(s) (LRB): STERNOTOMY - Repair of Left Atrium (N/A) CLIPPING OF ATRIAL APPENDAGE (N/A)  She has been hemodynamically stable overnight but on levophed to support BP.  Rhythm is persistent atrial fib and rate is slow this am in the 40-50 range. Pacing VVI 80. Renal function stable Plan to wean vent as tolerated today.   LOS: 1 day    Alleen BorneBryan K Eliazer Hemphill 10/18/2016

## 2016-10-18 NOTE — Progress Notes (Signed)
2.5 mg lopressor IV given for HR 130-150's (afib), EKG done to confirm rhythm. Pt tolerated the dose and responded well. Will continue to monitor closely.

## 2016-10-19 ENCOUNTER — Inpatient Hospital Stay (HOSPITAL_COMMUNITY): Payer: Medicare Other

## 2016-10-19 ENCOUNTER — Other Ambulatory Visit: Payer: Self-pay | Admitting: Cardiovascular Disease

## 2016-10-19 DIAGNOSIS — I4891 Unspecified atrial fibrillation: Secondary | ICD-10-CM

## 2016-10-19 LAB — GLUCOSE, CAPILLARY
GLUCOSE-CAPILLARY: 125 mg/dL — AB (ref 65–99)
GLUCOSE-CAPILLARY: 133 mg/dL — AB (ref 65–99)
GLUCOSE-CAPILLARY: 132 mg/dL — AB (ref 65–99)
Glucose-Capillary: 126 mg/dL — ABNORMAL HIGH (ref 65–99)
Glucose-Capillary: 110 mg/dL — ABNORMAL HIGH (ref 65–99)

## 2016-10-19 LAB — BASIC METABOLIC PANEL
ANION GAP: 8 (ref 5–15)
BUN: 17 mg/dL (ref 6–20)
CALCIUM: 8.7 mg/dL — AB (ref 8.9–10.3)
CHLORIDE: 108 mmol/L (ref 101–111)
CO2: 23 mmol/L (ref 22–32)
Creatinine, Ser: 1.18 mg/dL — ABNORMAL HIGH (ref 0.44–1.00)
GFR calc non Af Amer: 43 mL/min — ABNORMAL LOW (ref >60)
GFR, EST AFRICAN AMERICAN: 49 mL/min — AB (ref >60)
Glucose, Bld: 141 mg/dL — ABNORMAL HIGH (ref 65–99)
Potassium: 3.9 mmol/L (ref 3.5–5.1)
SODIUM: 139 mmol/L (ref 135–145)

## 2016-10-19 LAB — POCT I-STAT, CHEM 8
BUN: 20 mg/dL (ref 6–20)
CALCIUM ION: 1.27 mmol/L (ref 1.15–1.40)
CHLORIDE: 106 mmol/L (ref 101–111)
Creatinine, Ser: 1.1 mg/dL — ABNORMAL HIGH (ref 0.44–1.00)
GLUCOSE: 127 mg/dL — AB (ref 65–99)
HCT: 29 % — ABNORMAL LOW (ref 36.0–46.0)
Hemoglobin: 9.9 g/dL — ABNORMAL LOW (ref 12.0–15.0)
Potassium: 4 mmol/L (ref 3.5–5.1)
SODIUM: 141 mmol/L (ref 135–145)
TCO2: 23 mmol/L (ref 0–100)

## 2016-10-19 LAB — CBC
HCT: 30.7 % — ABNORMAL LOW (ref 36.0–46.0)
HEMOGLOBIN: 10.5 g/dL — AB (ref 12.0–15.0)
MCH: 30.5 pg (ref 26.0–34.0)
MCHC: 34.2 g/dL (ref 30.0–36.0)
MCV: 89.2 fL (ref 78.0–100.0)
Platelets: 133 10*3/uL — ABNORMAL LOW (ref 150–400)
RBC: 3.44 MIL/uL — AB (ref 3.87–5.11)
RDW: 20 % — ABNORMAL HIGH (ref 11.5–15.5)
WBC: 13.8 10*3/uL — AB (ref 4.0–10.5)

## 2016-10-19 MED ORDER — AMIODARONE HCL IN DEXTROSE 360-4.14 MG/200ML-% IV SOLN
30.0000 mg/h | INTRAVENOUS | Status: DC
  Administered 2016-10-19 – 2016-10-22 (×7): 30 mg/h via INTRAVENOUS
  Filled 2016-10-19 (×7): qty 200

## 2016-10-19 MED ORDER — FUROSEMIDE 10 MG/ML IJ SOLN
20.0000 mg | Freq: Two times a day (BID) | INTRAMUSCULAR | Status: DC
  Administered 2016-10-19 – 2016-10-22 (×6): 20 mg via INTRAVENOUS
  Filled 2016-10-19 (×6): qty 2

## 2016-10-19 MED ORDER — AMIODARONE HCL IN DEXTROSE 360-4.14 MG/200ML-% IV SOLN
INTRAVENOUS | Status: AC
  Filled 2016-10-19: qty 200

## 2016-10-19 MED ORDER — SODIUM CHLORIDE 0.9% FLUSH
10.0000 mL | INTRAVENOUS | Status: DC | PRN
  Administered 2016-10-26: 10 mL
  Filled 2016-10-19: qty 40

## 2016-10-19 MED ORDER — DEXTROSE 5 % IV SOLN
1.5000 g | Freq: Two times a day (BID) | INTRAVENOUS | Status: AC
  Administered 2016-10-19 – 2016-10-20 (×4): 1.5 g via INTRAVENOUS
  Filled 2016-10-19 (×4): qty 1.5

## 2016-10-19 MED ORDER — AMIODARONE HCL IN DEXTROSE 360-4.14 MG/200ML-% IV SOLN
60.0000 mg/h | INTRAVENOUS | Status: AC
  Administered 2016-10-19 (×2): 60 mg/h via INTRAVENOUS
  Filled 2016-10-19: qty 200

## 2016-10-19 MED ORDER — SODIUM CHLORIDE 0.9 % IV SOLN
30.0000 meq | Freq: Once | INTRAVENOUS | Status: AC
  Administered 2016-10-19: 30 meq via INTRAVENOUS
  Filled 2016-10-19: qty 15

## 2016-10-19 MED ORDER — SODIUM CHLORIDE 0.9% FLUSH
10.0000 mL | Freq: Two times a day (BID) | INTRAVENOUS | Status: DC
  Administered 2016-10-19 – 2016-10-29 (×11): 10 mL

## 2016-10-19 MED ORDER — DEXTROSE 5 % IV SOLN
0.0000 ug/min | INTRAVENOUS | Status: DC
  Filled 2016-10-19 (×3): qty 4

## 2016-10-19 MED ORDER — AMIODARONE LOAD VIA INFUSION
150.0000 mg | Freq: Once | INTRAVENOUS | Status: AC
  Administered 2016-10-19: 150 mg via INTRAVENOUS
  Filled 2016-10-19: qty 83.34

## 2016-10-19 MED ORDER — METOPROLOL TARTRATE 5 MG/5ML IV SOLN
5.0000 mg | Freq: Four times a day (QID) | INTRAVENOUS | Status: DC
  Administered 2016-10-19 – 2016-10-20 (×2): 5 mg via INTRAVENOUS
  Filled 2016-10-19 (×2): qty 5

## 2016-10-19 MED ORDER — BUDESONIDE 0.25 MG/2ML IN SUSP
0.2500 mg | Freq: Two times a day (BID) | RESPIRATORY_TRACT | Status: DC
  Administered 2016-10-19 – 2016-10-28 (×18): 0.25 mg via RESPIRATORY_TRACT
  Filled 2016-10-19 (×19): qty 2

## 2016-10-19 NOTE — Progress Notes (Signed)
2 Days Post-Op Procedure(s) (LRB): STERNOTOMY - Repair of Left Atrium (N/A) CLIPPING OF ATRIAL APPENDAGE (N/A) Subjective: Wet cough- swallow eval in progress Chest tubes > 500 cc, leave Rapid afib - iv amio started BP better, weaning norepi wi up 20lbs- lasix ordered Objective: Vital signs in last 24 hours: Temp:  [96.9 F (36.1 C)-99.3 F (37.4 C)] 99 F (37.2 C) (12/09 0800) Pulse Rate:  [80-150] 108 (12/09 1015) Cardiac Rhythm: Atrial fibrillation (12/09 1015) Resp:  [0-29] 22 (12/09 1015) BP: (90-150)/(41-71) 150/53 (12/09 1000) SpO2:  [93 %-100 %] 99 % (12/09 1015) Arterial Line BP: (95-184)/(32-52) 137/47 (12/09 1015) FiO2 (%):  [40 %] 40 % (12/08 1217) Weight:  [237 lb 14 oz (107.9 kg)] 237 lb 14 oz (107.9 kg) (12/09 0445)  Hemodynamic parameters for last 24 hours:  afib  Intake/Output from previous day: 12/08 0701 - 12/09 0700 In: 2781.8 [P.O.:1100; I.V.:1581.8; IV Piggyback:100] Out: 1145 [Urine:450; Chest Tube:695] Intake/Output this shift: Total I/O In: 75.5 [I.V.:75.5] Out: 75 [Urine:75]  Neck hematoma better  Lab Results:  Recent Labs  10/18/16 1614 10/18/16 1615 10/19/16 0403  WBC 14.5*  --  13.8*  HGB 11.3* 10.2* 10.5*  HCT 32.5* 30.0* 30.7*  PLT 133*  --  133*   BMET:  Recent Labs  10/18/16 0400  10/18/16 1615 10/19/16 0403  NA 141  --  139 139  K 4.0  --  3.7 3.9  CL 112*  --  109 108  CO2 23  --   --  23  GLUCOSE 112*  --  111* 141*  BUN 12  --  16 17  CREATININE 0.94  < > 1.00 1.18*  CALCIUM 8.2*  --   --  8.7*  < > = values in this interval not displayed.  PT/INR:  Recent Labs  May 30, 2016 1514  LABPROT 21.6*  INR 1.86   ABG    Component Value Date/Time   PHART 7.347 (L) 10/18/2016 1355   HCO3 21.3 10/18/2016 1355   TCO2 22 10/18/2016 1615   ACIDBASEDEF 4.0 (H) 10/18/2016 1355   O2SAT 98.0 10/18/2016 1355   CBG (last 3)   Recent Labs  10/19/16 0334 10/19/16 0726 10/19/16 1127  GLUCAP 126* 125* 133*     Assessment/Plan: S/P Procedure(s) (LRB): STERNOTOMY - Repair of Left Atrium (N/A) CLIPPING OF ATRIAL APPENDAGE (N/A) Mobilize Diuresis place PICC and remove femoral vein line   LOS: 2 days    [name] 10/19/2016

## 2016-10-19 NOTE — Evaluation (Signed)
Clinical/Bedside Swallow Evaluation Patient Details  Name: [name] MRN: [id number] Date of Birth: 20-Apr-1935  Today's Date: 10/19/2016 Time: SLP Start Time (ACUTE ONLY): 1125 SLP Stop Time (ACUTE ONLY): 1139 SLP Time Calculation (min) (ACUTE ONLY): 14 min  Past Medical History:  Past Medical History:  Diagnosis Date  . Anemia    a. 02/2014 in setting of GIB req 3U PRBC's.  . Cellulitis and abscess of leg, except foot   . Chronic a-fib (HCC)    a. CHA2DS2VASc = 5 (HTN, age > 3475, DM2, female);  b. Eliquis d/c'd 4/015 in setting of BRBPR/GIB;  c. Rate-control w/ BB.  . Diverticulosis   . Gastritis    a. 02/2014 EGD.  Marland Kitchen. GIB (gastrointestinal bleeding)    a. 02/2014 BRBPR;  b. 02/2014 Colonoscopy/EGD: diverticulosis with polys/polypectomy - ascending colon, Gastritis on EDG.  . HTN (hypertension)   . Hyperlipidemia   . Obesity, unspecified   . Personal history of colonic polyps   . Plantar fascial fibromatosis   . RBBB   . Type II or unspecified type diabetes mellitus without mention of complication, not stated as uncontrolled    Past Surgical History:  Past Surgical History:  Procedure Laterality Date  . CARDIAC CATHETERIZATION N/A 10/30/2016   Procedure: Pericardiocentesis;  Surgeon: Hillis RangeJames Allred, MD;  [facility];  Service: Cardiovascular;  Laterality: N/A;  . EMBOLIZATION N/A 03/15/2015   Procedure: Embolization;  Surgeon: Annice NeedyJason S Dew, MD;  Location: [facility] INVASIVE CV LAB;  Service: Cardiovascular;  Laterality: N/A;  . LEFT ATRIAL APPENDAGE OCCLUSION N/A 11/02/2016   Procedure: LEFT ATRIAL APPENDAGE OCCLUSION;  Surgeon: Hillis RangeJames Allred, MD;  Location: [facility] INVASIVE CV LAB;  Service: Cardiovascular;  Laterality: N/A;  . NO PAST SURGERIES    . PERIPHERAL VASCULAR CATHETERIZATION N/A 03/15/2015   Procedure: Visceral Angiography;  Surgeon: Annice NeedyJason S Dew, MD;  Location: [facility] INVASIVE CV LAB;  Service: Cardiovascular;  Laterality: N/A;  . TEE WITHOUT CARDIOVERSION N/A 07/09/2016    Procedure: TRANSESOPHAGEAL ECHOCARDIOGRAM (TEE);  Surgeon: Lewayne BuntingBrian S Crenshaw, MD;  [facility];  Service: Cardiovascular;  Laterality: N/A;  . TEE WITHOUT CARDIOVERSION  07/18/2016   Procedure: Transesophageal Echocardiogram (Tee);  Surgeon: Lars MassonKatarina H Nelson, MD;  [facility];  Service: Cardiovascular;;   HPI:  81 year old female with h/o permanent a-fib admitted 12/7 for implantation of left atrial appendage occluder. Brought to OR for emergency repair cardiac perforaction following attempted procedure. Remained sedated on vent and extubated 12/8.   Assessment / Plan / Recommendation Clinical Impression  Patient presents with indication of aspiration across consistencies characterized by throat clearing/cough post swallow. Should be noted however that patient also has a baseline congested cough which might be either contributing to decreased airway protection with pos or appearing related to pos despite intact airway protection. Regardless, patient significantly deconditioned, able to to accept only a few boluses before fatiguing and with increased coughing, c/o mid sternal chest pain s/p surgery. Would not suspect significant dysphagia s/p only 24 hour intubation however potential intubation related trauma and deconditioning may be playing a role. Discussed with RN and MD. Decision made to keep NPO today and f/u in am for readiness for either pos or instrumental testing.     Aspiration Risk  Moderate aspiration risk;Severe aspiration risk    Diet Recommendation NPO   Medication Administration: Via alternative means    Other  Recommendations Oral Care Recommendations: Oral care QID   Follow up Recommendations None  Frequency and Duration min 2x/week  2 weeks       Prognosis Prognosis for Safe Diet Advancement: Good      Swallow Study   General HPI: 81 year old female with h/o permanent a-fib admitted 12/7 for implantation of left atrial appendage  occluder. Brought to OR for emergency repair cardiac perforaction following attempted procedure. Remained sedated on vent and extubated 12/8. Type of Study: Bedside Swallow Evaluation Previous Swallow Assessment: none Diet Prior to this Study: NPO Temperature Spikes Noted: No Respiratory Status: Nasal cannula History of Recent Intubation: Yes Length of Intubations (days): 1 days Date extubated: 10/18/16 Behavior/Cognition: Alert;Cooperative;Pleasant mood;Confused Oral Cavity Assessment: Dry Oral Care Completed by SLP: Yes Oral Cavity - Dentition: Adequate natural dentition Vision: Functional for self-feeding Self-Feeding Abilities: Able to feed self;Needs assist Patient Positioning: Upright in bed Baseline Vocal Quality: Hoarse (mild) Volitional Cough: Weak;Congested Volitional Swallow: Able to elicit    Oral/Motor/Sensory Function Overall Oral Motor/Sensory Function: Generalized oral weakness   Ice Chips Ice chips: Impaired Presentation: Spoon Pharyngeal Phase Impairments: Multiple swallows   Thin Liquid Thin Liquid: Impaired Presentation: Spoon Pharyngeal  Phase Impairments: Multiple swallows;Cough - Delayed;Cough - Immediate    Nectar Thick Nectar Thick Liquid: Not tested   Honey Thick Honey Thick Liquid: Not tested   Puree Puree: Impaired Presentation: Spoon Pharyngeal Phase Impairments: Cough - Immediate   Solid   GO  Julie Ohm MA, CCC-SLP [phone]  Solid: Not tested        [name] 10/19/2016,11:52 AM

## 2016-10-19 NOTE — Progress Notes (Signed)
Peripherally Inserted Central Catheter/Midline Placement  The IV Nurse has discussed with the patient and/or persons authorized to consent for the patient, the purpose of this procedure and the potential benefits and risks involved with this procedure.  The benefits include less needle sticks, lab draws from the catheter, and the patient may be discharged home with the catheter. Risks include, but not limited to, infection, bleeding, blood clot (thrombus formation), and puncture of an artery; nerve damage and irregular heartbeat and possibility to perform a PICC exchange if needed/ordered by physician.  Alternatives to this procedure were also discussed.  Bard Power PICC patient education guide, fact sheet on infection prevention and patient information card has been provided to patient /or left at bedside.    PICC/Midline Placement Documentation     Telephone conssent by POA by Abigail MiyamotoSherry Stone  Reginia FortsLumban, Jakota Manthei Albarece 10/19/2016, 11:58 AM

## 2016-10-19 NOTE — Progress Notes (Signed)
Improving slowly.   She is extubated and interacting.  Mildly confused.  She has chest soreness and cough.   BP remains soft.  Mildly tachycardic with permanent afib.  I suspect that her V rates will recover once she is clinically improved without much intervention required.  Hillis RangeJames Aubery Douthat MD, [facility] 10/19/2016 10:32 AM

## 2016-10-19 NOTE — Progress Notes (Signed)
  Amiodarone Drug - Drug Interaction Consult Note  Recommendations: No major drug interactions identified.  Amiodarone is metabolized by the cytochrome P450 system and therefore has the potential to cause many drug interactions. Amiodarone has an average plasma half-life of 50 days (range 20 to 100 days).   There is potential for drug interactions to occur several weeks or months after stopping treatment and the onset of drug interactions may be slow after initiating amiodarone.   []  Statins: Increased risk of myopathy. Simvastatin- restrict dose to 20mg  daily. Other statins: counsel patients to report any muscle pain or weakness immediately.  []  Anticoagulants: Amiodarone can increase anticoagulant effect. Consider warfarin dose reduction. Patients should be monitored closely and the dose of anticoagulant altered accordingly, remembering that amiodarone levels take several weeks to stabilize.  []  Antiepileptics: Amiodarone can increase plasma concentration of phenytoin, the dose should be reduced. Note that small changes in phenytoin dose can result in large changes in levels. Monitor patient and counsel on signs of toxicity.  [x]  Beta blockers: increased risk of bradycardia, AV block and myocardial depression. Sotalol - avoid concomitant use.  []   Calcium channel blockers (diltiazem and verapamil): increased risk of bradycardia, AV block and myocardial depression.  []   Cyclosporine: Amiodarone increases levels of cyclosporine. Reduced dose of cyclosporine is recommended.  []  Digoxin dose should be halved when amiodarone is started.  []  Diuretics: increased risk of cardiotoxicity if hypokalemia occurs.  []  Oral hypoglycemic agents (glyburide, glipizide, glimepiride): increased risk of hypoglycemia. Patient's glucose levels should be monitored closely when initiating amiodarone therapy.   []  Drugs that prolong the QT interval:  Torsades de pointes risk may be increased with concurrent use  - avoid if possible.  Monitor QTc, also keep magnesium/potassium WNL if concurrent therapy can't be avoided. Marland Kitchen. Antibiotics: e.g. fluoroquinolones, erythromycin. . Antiarrhythmics: e.g. quinidine, procainamide, disopyramide, sotalol. . Antipsychotics: e.g. phenothiazines, haloperidol.  . Lithium, tricyclic antidepressants, and methadone.  Thank You,  Fredrik RiggerMarkle, Malisa Ruggiero Sue  10/19/2016 10:31 AM

## 2016-10-19 NOTE — OR Nursing (Signed)
LATE ENTRY: Atriclip documented as a supply versus implant.  Corrected documentation to implant.

## 2016-10-19 NOTE — Anesthesia Postprocedure Evaluation (Signed)
Anesthesia Post Note  Patient: [name]  Procedure(s) Performed: Procedure(s) (LRB): STERNOTOMY - Repair of Left Atrium (N/A) CLIPPING OF ATRIAL APPENDAGE (N/A)  Patient location during evaluation: SICU Anesthesia Type: General Level of consciousness: sedated and patient remains intubated per anesthesia plan Pain management: pain level controlled Vital Signs Assessment: post-procedure vital signs reviewed and stable Respiratory status: patient on ventilator - see flowsheet for VS and patient remains intubated per anesthesia plan Cardiovascular status: blood pressure returned to baseline Anesthetic complications: no    Last Vitals:  Vitals:   10/19/16 2039 10/19/16 2100  BP:  (!) 122/51  Pulse:  (!) 125  Resp:  13  Temp: 36.8 C     Last Pain:  Vitals:   10/19/16 2100  TempSrc:   PainSc: 4                  Witt Plitt COKER

## 2016-10-20 ENCOUNTER — Encounter (HOSPITAL_COMMUNITY): Payer: Self-pay | Admitting: *Deleted

## 2016-10-20 ENCOUNTER — Inpatient Hospital Stay (HOSPITAL_COMMUNITY): Payer: Medicare Other

## 2016-10-20 LAB — BASIC METABOLIC PANEL
Anion gap: 6 (ref 5–15)
BUN: 21 mg/dL — ABNORMAL HIGH (ref 6–20)
CO2: 25 mmol/L (ref 22–32)
Calcium: 8.3 mg/dL — ABNORMAL LOW (ref 8.9–10.3)
Chloride: 108 mmol/L (ref 101–111)
Creatinine, Ser: 1.26 mg/dL — ABNORMAL HIGH (ref 0.44–1.00)
GFR calc Af Amer: 46 mL/min — ABNORMAL LOW (ref >60)
GFR calc non Af Amer: 39 mL/min — ABNORMAL LOW (ref >60)
Glucose, Bld: 120 mg/dL — ABNORMAL HIGH (ref 65–99)
Potassium: 3.9 mmol/L (ref 3.5–5.1)
Sodium: 139 mmol/L (ref 135–145)

## 2016-10-20 LAB — POCT I-STAT, CHEM 8
BUN: 23 mg/dL — ABNORMAL HIGH (ref 6–20)
Calcium, Ion: 1.22 mmol/L (ref 1.15–1.40)
Chloride: 105 mmol/L (ref 101–111)
Creatinine, Ser: 1.1 mg/dL — ABNORMAL HIGH (ref 0.44–1.00)
GLUCOSE: 124 mg/dL — AB (ref 65–99)
HEMATOCRIT: 29 % — AB (ref 36.0–46.0)
HEMOGLOBIN: 9.9 g/dL — AB (ref 12.0–15.0)
POTASSIUM: 3.7 mmol/L (ref 3.5–5.1)
Sodium: 140 mmol/L (ref 135–145)
TCO2: 24 mmol/L (ref 0–100)

## 2016-10-20 LAB — CBC
HCT: 30.5 % — ABNORMAL LOW (ref 36.0–46.0)
Hemoglobin: 10.1 g/dL — ABNORMAL LOW (ref 12.0–15.0)
MCH: 30.2 pg (ref 26.0–34.0)
MCHC: 33.1 g/dL (ref 30.0–36.0)
MCV: 91.3 fL (ref 78.0–100.0)
Platelets: 139 K/uL — ABNORMAL LOW (ref 150–400)
RBC: 3.34 MIL/uL — ABNORMAL LOW (ref 3.87–5.11)
RDW: 20.4 % — ABNORMAL HIGH (ref 11.5–15.5)
WBC: 11.9 K/uL — ABNORMAL HIGH (ref 4.0–10.5)

## 2016-10-20 LAB — GLUCOSE, CAPILLARY
GLUCOSE-CAPILLARY: 114 mg/dL — AB (ref 65–99)
GLUCOSE-CAPILLARY: 102 mg/dL — AB (ref 65–99)
Glucose-Capillary: 113 mg/dL — ABNORMAL HIGH (ref 65–99)
Glucose-Capillary: 113 mg/dL — ABNORMAL HIGH (ref 65–99)
Glucose-Capillary: 114 mg/dL — ABNORMAL HIGH (ref 65–99)
Glucose-Capillary: 125 mg/dL — ABNORMAL HIGH (ref 65–99)
Glucose-Capillary: 130 mg/dL — ABNORMAL HIGH (ref 65–99)

## 2016-10-20 MED ORDER — JEVITY 1.2 CAL PO LIQD
1000.0000 mL | ORAL | Status: DC
  Administered 2016-10-21: 1000 mL
  Filled 2016-10-20 (×4): qty 1000

## 2016-10-20 MED ORDER — JEVITY 1.2 CAL PO LIQD
237.0000 mL | ORAL | Status: DC
Start: 2016-10-20 — End: 2016-10-20
  Filled 2016-10-20 (×5): qty 237

## 2016-10-20 MED ORDER — METOPROLOL TARTRATE 25 MG PO TABS: 25.0000 mg | ORAL_TABLET | Freq: Two times a day (BID) | ORAL | Status: DC

## 2016-10-20 MED ORDER — ALBUMIN HUMAN 25 % IV SOLN
12.5000 g | Freq: Four times a day (QID) | INTRAVENOUS | Status: AC
  Administered 2016-10-20 – 2016-10-21 (×2): 12.5 g via INTRAVENOUS
  Filled 2016-10-20 (×2): qty 50

## 2016-10-20 MED ORDER — METOPROLOL TARTRATE 5 MG/5ML IV SOLN
5.0000 mg | Freq: Four times a day (QID) | INTRAVENOUS | Status: DC
  Administered 2016-10-20 – 2016-10-22 (×8): 5 mg via INTRAVENOUS
  Filled 2016-10-20 (×9): qty 5

## 2016-10-20 NOTE — Progress Notes (Signed)
Speech Language Pathology Treatment: Dysphagia  Patient Details Name: Vernie AmmonsJoanne S Zarazua MRN: [id number] DOB: 1935/03/21 Today's Date: 10/20/2016 Time: [id number] SLP Time Calculation (min) (ACUTE ONLY): 11 min  Assessment / Plan / Recommendation Clinical Impression  SLP provided diagnostic PO trials with pt sitting upright in her chair. No baseline coughing was observed today, although she does remain very deconditioned. Vocal quality is soft. Pt consumed ice chips and small amounts of thin liquids with immediate coughing noted after most boluses. Cough is very weak, likely in part due to associated pain, and does not appear to be effective in clearing suspected penetrates and/or aspirates. Very small amounts of puree elicited multiple swallows and delayed, weak cough. Pt spontaneously uses a chin tuck across all intake, but she says that this is not a previously taught strategy. Given overall performance as well as RN's concerns about her respiratory status today, recommend to continue NPO status. Will f/u on next date for readiness to initiate POs versus more likely need for instrumental testing.   HPI HPI: 81 year old female with h/o permanent a-fib admitted 12/7 for implantation of left atrial appendage occluder. Brought to OR for emergency repair cardiac perforaction following attempted procedure. Remained sedated on vent and extubated 12/8.      SLP Plan  Continue with current plan of care     Recommendations  Diet recommendations: NPO Medication Administration: Via alternative means                Oral Care Recommendations: Oral care QID Follow up Recommendations:  (tba) Plan: Continue with current plan of care       GO                [name], Ranulfo Kall 10/20/2016, 9:23 AM  Maxcine HamLaura Paiewonsky, M.A. CCC-SLP [phone]

## 2016-10-20 NOTE — Progress Notes (Signed)
Pt Lt femoral CVC D/C'd per MD order. Pt laid flat and retaining sutures removed. CVC removed without difficulty and intact. Pressure held at site x 10 mins till hemostasis noted. Bundle of 4x4 gauze applied to site and secured. Pt tolerated procedure without difficulty.

## 2016-10-20 NOTE — Progress Notes (Signed)
CT surgery p.m. Rounds  Patient alert and comfortable now in bed with family at bedside Rate controlled atrial fibrillation Blood pressure stable urine output adequate Chest tubes removed Modified barium swallow test tomorrow and possible core track placement to start tube feeds and oral medications.

## 2016-10-20 NOTE — Progress Notes (Signed)
3 Days Post-Op Procedure(s) (LRB): STERNOTOMY - Repair of Left Atrium (N/A) CLIPPING OF ATRIAL APPENDAGE (N/A) Subjective:  Not safe for po intake, meds Will place coretrack Afib, no fever OOB to chair with lift Chest tubes with min drainage- DC INR back to normal Objective: Vital signs in last 24 hours: Temp:  [98.3 F (36.8 C)-99.1 F (37.3 C)] 98.9 F (37.2 C) (12/10 1106) Pulse Rate:  [41-138] 41 (12/10 1100) Cardiac Rhythm: Atrial fibrillation (12/10 1100) Resp:  [0-25] 22 (12/10 1100) BP: (102-161)/(44-76) 146/54 (12/10 1100) SpO2:  [94 %-100 %] 96 % (12/10 1100) Arterial Line BP: (137-160)/(41-57) 151/49 (12/09 1400) Weight:  [231 lb 4.2 oz (104.9 kg)] 231 lb 4.2 oz (104.9 kg) (12/10 0600)  Hemodynamic parameters for last 24 hours:    Intake/Output from previous day: 12/09 0701 - 12/10 0700 In: 1562.5 [I.V.:1197.5; IV Piggyback:365] Out: 1010 [Urine:800; Chest Tube:210] Intake/Output this shift: Total I/O In: 146.8 [I.V.:146.8] Out: 75 [Urine:75]  Edematous Neck hematoma better Coarse breath sounds Lab Results:  Recent Labs  10/19/16 0403 10/19/16 1800 10/20/16 0423  WBC 13.8*  --  11.9*  HGB 10.5* 9.9* 10.1*  HCT 30.7* 29.0* 30.5*  PLT 133*  --  139*   BMET:  Recent Labs  10/19/16 0403 10/19/16 1800 10/20/16 0423  NA 139 141 139  K 3.9 4.0 3.9  CL 108 106 108  CO2 23  --  25  GLUCOSE 141* 127* 120*  BUN 17 20 21*  CREATININE 1.18* 1.10* 1.26*  CALCIUM 8.7*  --  8.3*    PT/INR:  Recent Labs  07-17-16 1514  LABPROT 21.6*  INR 1.86   ABG    Component Value Date/Time   PHART 7.347 (L) 10/18/2016 1355   HCO3 21.3 10/18/2016 1355   TCO2 23 10/19/2016 1800   ACIDBASEDEF 4.0 (H) 10/18/2016 1355   O2SAT 98.0 10/18/2016 1355   CBG (last 3)   Recent Labs  10/20/16 0412 10/20/16 0755 10/20/16 1104  GLUCAP 114* 113* 125*    Assessment/Plan: S/P Procedure(s) (LRB): STERNOTOMY - Repair of Left Atrium (N/A) CLIPPING OF ATRIAL  APPENDAGE (N/A) Mobilize Diuresis start tube feeds  No anticoagulation due to coagulapathy   LOS: 3 days    [name] 10/20/2016

## 2016-10-20 NOTE — Evaluation (Signed)
Physical Therapy Evaluation Patient Details Name: Julie AmmonsJoanne S [name] MRN: [id number] DOB: May 04, 1935 Today's Date: 10/20/2016   History of Present Illness  Patient is a 81 yo female admitted 10/14/2016 following perforation of Lt atrium during procedure.  Patient now s/p emergent sternotomy for repair Lt atrium and clipping of atrial appendage.   Patient extubated 10/18/16.      PMH:  Chronic Afib, DM, obesity, HLD, HTN, RBBB, GIB, anemia    Clinical Impression  Patient presents with problems listed below.  Will benefit from acute PT to maximize functional mobility prior to discharge.  Patient was at Mod I level with use of RW pta, living alone.  Today, patient requiring +2 max-total assist for bed mobility and sitting EOB.  Patient with significant deconditioning, and cognitive impairments.  Feel that patient will require longer recovery/rehab time.  Recommend SNF at d/c for continued therapy.    Follow Up Recommendations SNF;Supervision/Assistance - 24 hour    Equipment Recommendations  3in1 (PT);Wheelchair (measurements PT);Wheelchair cushion (measurements PT)    Recommendations for Other Services       Precautions / Restrictions Precautions Precautions: Fall;Sternal Precaution Comments: Reviewed sternal precautions Restrictions Weight Bearing Restrictions: No      Mobility  Bed Mobility Overal bed mobility: Needs Assistance Bed Mobility: Supine to Sit;Sit to Supine     Supine to sit: Max assist;+2 for physical assistance Sit to supine: Total assist;+2 for physical assistance   General bed mobility comments: Verbal cues for technique to maintain sternal precautions.  Patient able to move LE's toward side of bed - repeated cueing to continue.  Assist to bring trunk to sitting position and LE's off of floor.  Used bed pads to scoot patient to EOB in sitting.  Patient sat EOB x 6 minutes working on upright posture and sitting balance.  Patient fatigued.  Required total assist to return to  supine, control descent of trunk and bringing LE's onto bed.  Transfers                 General transfer comment: NT  Ambulation/Gait                Stairs            Wheelchair Mobility    Modified Rankin (Stroke Patients Only)       Balance Overall balance assessment: Needs assistance Sitting-balance support: Bilateral upper extremity supported;Feet supported Sitting balance-Leahy Scale: Poor Sitting balance - Comments: Patient requiring UE support and up to mod assist to maintain upright balance. Postural control: Posterior lean                                   Pertinent Vitals/Pain Pain Assessment: Faces Faces Pain Scale: Hurts even more Pain Location: Chest/incision area Pain Descriptors / Indicators: Grimacing;Guarding;Moaning Pain Intervention(s): Limited activity within patient's tolerance;Monitored during session;Repositioned    Home Living Family/patient expects to be discharged to:: Private residence Living Arrangements: Alone Available Help at Discharge: Family;Neighbor;Available PRN/intermittently Type of Home: House Home Access: Stairs to enter Entrance Stairs-Rails: Doctor, general practiceight;Left Entrance Stairs-Number of Steps: 4 Home Layout: One level Home Equipment: Walker - 2 wheels      Prior Function Level of Independence: Independent with assistive device(s);Needs assistance   Gait / Transfers Assistance Needed: Patient uses RW for gait  ADL's / Homemaking Assistance Needed: Independent with bathing/dressing, meal prep.  Assist for housekeeping.  Comments: Patient drives     Hand  Dominance        Extremity/Trunk Assessment   Upper Extremity Assessment: Generalized weakness (Edema noted BUE's)           Lower Extremity Assessment: Generalized weakness;Difficult to assess due to impaired cognition         Communication   Communication: Expressive difficulties (Difficulty saying word she wants to say. Slow  speech.)  Cognition Arousal/Alertness: Lethargic Behavior During Therapy: Flat affect Overall Cognitive Status: Impaired/Different from baseline Area of Impairment: Orientation;Attention;Memory;Following commands;Awareness;Problem solving Orientation Level: Disoriented to;Time;Situation Current Attention Level: Focused Memory: Decreased short-term memory Following Commands: Follows one step commands with increased time     Problem Solving: Slow processing;Decreased initiation;Difficulty sequencing;Requires verbal cues;Requires tactile cues      General Comments      Exercises     Assessment/Plan    PT Assessment Patient needs continued PT services  PT Problem List Decreased strength;Decreased activity tolerance;Decreased balance;Decreased mobility;Decreased cognition;Decreased knowledge of use of DME;Decreased knowledge of precautions;Cardiopulmonary status limiting activity;Obesity;Pain          PT Treatment Interventions DME instruction;Gait training;Functional mobility training;Therapeutic activities;Therapeutic exercise;Balance training;Cognitive remediation;Patient/family education    PT Goals (Current goals can be found in the Care Plan section)  Acute Rehab PT Goals Patient Stated Goal: None stated PT Goal Formulation: With patient Time For Goal Achievement: 11/03/16 Potential to Achieve Goals: Good    Frequency Min 3X/week   Barriers to discharge Decreased caregiver support Patient lives alone.    Co-evaluation               End of Session Equipment Utilized During Treatment: Oxygen Activity Tolerance: Patient limited by fatigue;Patient limited by pain Patient left: in bed;with call bell/phone within reach Nurse Communication: Mobility status;Need for lift equipment         Time: 1443-1456 PT Time Calculation (min) (ACUTE ONLY): 13 min   Charges:   PT Evaluation $PT Eval High Complexity: 1 Procedure     PT G Codes:        Vena AustriaSusan H  Darran Gabay 10/20/2016, 6:24 PM Durenda HurtSusan H. Renaldo Fiddleravis, PT, [facility] Acute Rehab Services Pager [phone]

## 2016-10-20 NOTE — Progress Notes (Signed)
Events reviewed CVL has been removed and PICC is now in place Failed swallow study and remains very weak  Hope to get up to chair today.   Heart rates with afib at times elevated but mostly stable.  Should improve with her clinical condition.  Hillis RangeJames Maher Shon MD, [facility] 10/20/2016 8:33 AM

## 2016-10-21 ENCOUNTER — Inpatient Hospital Stay (HOSPITAL_COMMUNITY): Payer: Medicare Other

## 2016-10-21 ENCOUNTER — Encounter (HOSPITAL_COMMUNITY): Payer: Self-pay | Admitting: Surgery

## 2016-10-21 DIAGNOSIS — T82599D Other mechanical complication of unspecified cardiac and vascular devices and implants, subsequent encounter: Secondary | ICD-10-CM

## 2016-10-21 LAB — TYPE AND SCREEN
BLOOD PRODUCT EXPIRATION DATE: 201712142359
BLOOD PRODUCT EXPIRATION DATE: 201712142359
BLOOD PRODUCT EXPIRATION DATE: 201712212359
BLOOD PRODUCT EXPIRATION DATE: 201712212359
BLOOD PRODUCT EXPIRATION DATE: 201712212359
BLOOD PRODUCT EXPIRATION DATE: 201712222359
BLOOD PRODUCT EXPIRATION DATE: 201712232359
BLOOD PRODUCT EXPIRATION DATE: 201712232359
BLOOD PRODUCT EXPIRATION DATE: 201712232359
BLOOD PRODUCT EXPIRATION DATE: 201712252359
Blood Product Expiration Date: 201712142359
Blood Product Expiration Date: 201712212359
Blood Product Expiration Date: 201712222359
Blood Product Expiration Date: 201712222359
Blood Product Expiration Date: 201712222359
Blood Product Expiration Date: 201712222359
Blood Product Expiration Date: 201712222359
Blood Product Expiration Date: 201712222359
Blood Product Expiration Date: 201712222359
Blood Product Expiration Date: 201712232359
Blood Product Expiration Date: 201712232359
Blood Product Expiration Date: 201712232359
Blood Product Expiration Date: 201712232359
Blood Product Expiration Date: 201712232359
Blood Product Expiration Date: 201712232359
Blood Product Expiration Date: 201712232359
ISSUE DATE / TIME: 201712070959
ISSUE DATE / TIME: 201712071015
ISSUE DATE / TIME: 201712071015
ISSUE DATE / TIME: 201712071055
ISSUE DATE / TIME: 201712080819
ISSUE DATE / TIME: 201712081337
ISSUE DATE / TIME: 201712081337
ISSUE DATE / TIME: 201712081337
ISSUE DATE / TIME: 201712081352
ISSUE DATE / TIME: 201712070928
ISSUE DATE / TIME: 201712070928
ISSUE DATE / TIME: 201712070947
ISSUE DATE / TIME: 201712070947
ISSUE DATE / TIME: 201712070959
ISSUE DATE / TIME: 201712071015
ISSUE DATE / TIME: 201712071015
ISSUE DATE / TIME: 201712071055
ISSUE DATE / TIME: 201712081337
ISSUE DATE / TIME: 201712091026
ISSUE DATE / TIME: 201712091758
ISSUE DATE / TIME: 201712092208
ISSUE DATE / TIME: 201712100121
UNIT TYPE AND RH: 600
UNIT TYPE AND RH: 600
UNIT TYPE AND RH: 6200
UNIT TYPE AND RH: 6200
UNIT TYPE AND RH: 6200
UNIT TYPE AND RH: 6200
UNIT TYPE AND RH: 6200
UNIT TYPE AND RH: 6200
UNIT TYPE AND RH: 6200
UNIT TYPE AND RH: 6200
UNIT TYPE AND RH: 6200
UNIT TYPE AND RH: 6200
UNIT TYPE AND RH: 6200
Unit Type and Rh: 600
Unit Type and Rh: 6200
Unit Type and Rh: 6200
Unit Type and Rh: 6200
Unit Type and Rh: 6200
Unit Type and Rh: 6200
Unit Type and Rh: 6200
Unit Type and Rh: 6200
Unit Type and Rh: 6200
Unit Type and Rh: 6200
Unit Type and Rh: 6200
Unit Type and Rh: 6200
Unit Type and Rh: 6200

## 2016-10-21 LAB — BASIC METABOLIC PANEL
Anion gap: 7 (ref 5–15)
BUN: 24 mg/dL — ABNORMAL HIGH (ref 6–20)
CO2: 25 mmol/L (ref 22–32)
Calcium: 8.2 mg/dL — ABNORMAL LOW (ref 8.9–10.3)
Chloride: 108 mmol/L (ref 101–111)
Creatinine, Ser: 1.17 mg/dL — ABNORMAL HIGH (ref 0.44–1.00)
GFR calc Af Amer: 50 mL/min — ABNORMAL LOW (ref >60)
GFR calc non Af Amer: 43 mL/min — ABNORMAL LOW (ref >60)
Glucose, Bld: 112 mg/dL — ABNORMAL HIGH (ref 65–99)
Potassium: 3.5 mmol/L (ref 3.5–5.1)
Sodium: 140 mmol/L (ref 135–145)

## 2016-10-21 LAB — GLUCOSE, CAPILLARY
GLUCOSE-CAPILLARY: 108 mg/dL — AB (ref 65–99)
GLUCOSE-CAPILLARY: 98 mg/dL (ref 65–99)
GLUCOSE-CAPILLARY: 129 mg/dL — AB (ref 65–99)
Glucose-Capillary: 94 mg/dL (ref 65–99)
Glucose-Capillary: 120 mg/dL — ABNORMAL HIGH (ref 65–99)

## 2016-10-21 LAB — POCT I-STAT, CHEM 8
BUN: 27 mg/dL — ABNORMAL HIGH (ref 6–20)
CREATININE: 1.1 mg/dL — AB (ref 0.44–1.00)
Calcium, Ion: 1.2 mmol/L (ref 1.15–1.40)
Chloride: 105 mmol/L (ref 101–111)
Glucose, Bld: 112 mg/dL — ABNORMAL HIGH (ref 65–99)
HEMATOCRIT: 30 % — AB (ref 36.0–46.0)
Hemoglobin: 10.2 g/dL — ABNORMAL LOW (ref 12.0–15.0)
POTASSIUM: 3.6 mmol/L (ref 3.5–5.1)
Sodium: 143 mmol/L (ref 135–145)
TCO2: 25 mmol/L (ref 0–100)

## 2016-10-21 LAB — CBC
HCT: 30.1 % — ABNORMAL LOW (ref 36.0–46.0)
Hemoglobin: 9.9 g/dL — ABNORMAL LOW (ref 12.0–15.0)
MCH: 30.3 pg (ref 26.0–34.0)
MCHC: 32.9 g/dL (ref 30.0–36.0)
MCV: 92 fL (ref 78.0–100.0)
Platelets: 162 10*3/uL (ref 150–400)
RBC: 3.27 MIL/uL — ABNORMAL LOW (ref 3.87–5.11)
RDW: 20.2 % — ABNORMAL HIGH (ref 11.5–15.5)
WBC: 11 10*3/uL — ABNORMAL HIGH (ref 4.0–10.5)

## 2016-10-21 MED ORDER — FAMOTIDINE IN NACL 20-0.9 MG/50ML-% IV SOLN
20.0000 mg | Freq: Two times a day (BID) | INTRAVENOUS | Status: DC
  Administered 2016-10-21 – 2016-10-23 (×5): 20 mg via INTRAVENOUS
  Filled 2016-10-21 (×5): qty 50

## 2016-10-21 MED ORDER — SODIUM CHLORIDE 0.9 % IV SOLN
30.0000 meq | Freq: Once | INTRAVENOUS | Status: AC
  Administered 2016-10-21: 30 meq via INTRAVENOUS
  Filled 2016-10-21: qty 15

## 2016-10-21 MED ORDER — POTASSIUM CHLORIDE 20 MEQ/15ML (10%) PO SOLN
20.0000 meq | Freq: Once | ORAL | Status: AC
  Administered 2016-10-21: 20 meq
  Filled 2016-10-21: qty 15

## 2016-10-21 NOTE — Progress Notes (Signed)
4 Days Post-Op Procedure(s) (LRB): STERNOTOMY - Repair of Left Atrium (N/A) CLIPPING OF ATRIAL APPENDAGE (N/A) Subjective:  No complaints  Objective: Vital signs in last 24 hours: Temp:  [98.1 F (36.7 C)-98.9 F (37.2 C)] 98.3 F (36.8 C) (12/11 0720) Pulse Rate:  [35-124] 84 (12/11 0900) Cardiac Rhythm: Atrial fibrillation (12/10 2000) Resp:  [0-24] 18 (12/11 0900) BP: (73-151)/(47-71) 132/55 (12/11 0900) SpO2:  [94 %-100 %] 98 % (12/11 0900) Weight:  [105 kg (231 lb 7.7 oz)] 105 kg (231 lb 7.7 oz) (12/11 0500)  Hemodynamic parameters for last 24 hours:    Intake/Output from previous day: 12/10 0701 - 12/11 0700 In: 1409.1 [I.V.:944.1; IV Piggyback:465] Out: 1530 [Urine:1500; Chest Tube:30] Intake/Output this shift: Total I/O In: 63.4 [I.V.:63.4] Out: -   General appearance: slowed mentation Neurologic: intact Heart: irregularly irregular rhythm Lungs: rhonchi bilaterally Abdomen: soft, non-tender; bowel sounds normal; no masses,  no organomegaly Extremities: edema moderate anasarca Wound: incision ok  Lab Results:  Recent Labs  10/20/16 0423 10/20/16 1640 10/21/16 0404  WBC 11.9*  --  11.0*  HGB 10.1* 9.9* 9.9*  HCT 30.5* 29.0* 30.1*  PLT 139*  --  162   BMET:  Recent Labs  10/20/16 0423 10/20/16 1640 10/21/16 0404  NA 139 140 140  K 3.9 3.7 3.5  CL 108 105 108  CO2 25  --  25  GLUCOSE 120* 124* 112*  BUN 21* 23* 24*  CREATININE 1.26* 1.10* 1.17*  CALCIUM 8.3*  --  8.2*    PT/INR: No results for input(s): LABPROT, INR in the last 72 hours. ABG    Component Value Date/Time   PHART 7.347 (L) 10/18/2016 1355   HCO3 21.3 10/18/2016 1355   TCO2 24 10/20/2016 1640   ACIDBASEDEF 4.0 (H) 10/18/2016 1355   O2SAT 98.0 10/18/2016 1355   CBG (last 3)   Recent Labs  10/20/16 2357 10/21/16 0359 10/21/16 0715  GLUCAP 114* 108* 98   CLINICAL DATA:  Chest tube removal.  EXAM: PORTABLE CHEST 1 VIEW  COMPARISON:  10/20/2016.   August 05, 2016.  FINDINGS: Interim removal of mediastinal drainage catheters. Right PICC line in stable position. Prior CABG. Left atrial appendage clip noted . Cardiomegaly with pulmonary vascular prominence and bilateral interstitial prominence with bilateral pleural effusions noted. Findings consistent with congestive heart failure . Low lung volumes with basilar atelectasis. No pneumothorax.  IMPRESSION: 1.  Interim removal of mediastinal drainage catheters.  2. Prior CABG. Cardiomegaly with bilateral interstitial prominence and pleural effusions consistent with congestive heart failure. Low lung volumes with basilar atelectasis .   Electronically Signed   By: Maisie Fushomas  Register   On: 10/21/2016 07:35  Assessment/Plan: S/P Procedure(s) (LRB): STERNOTOMY - Repair of Left Atrium (N/A) CLIPPING OF ATRIAL APPENDAGE (N/A)  She is hemodynamically stable. Persistent atrial fib. On IV amio for rate control. Volume excess: continue gentle diuresis. Bilateral lower lobe atelectasis: encourage IS, Flutter valve and coughing. Deconditioning and chronic debilitation: PT. OOB Dysphagia: speech therapy planning FEES. Continue NPO for now. Depending on result of FEES she may need tube feeds.   LOS: 4 days    Alleen BorneBryan K Adilene Areola 10/21/2016

## 2016-10-21 NOTE — Care Management Note (Signed)
Case Management Note  Patient Details  Name: [name] MRN: [id number] Date of Birth: 21-Oct-1935  Subjective/Objective:    Pt is s/p sternotomy and clipping                Action/Plan:  PTA independent from home alone - neighbor will be support system at discharge.  Recommendation for SNF - CSW consulted for placement   Expected Discharge Date:                  Expected Discharge Plan:     In-House Referral:     Discharge planning Services  CM Consult  Post Acute Care Choice:    Choice offered to:     DME Arranged:    DME Agency:     HH Arranged:    HH Agency:     Status of Service:  In process, will continue to follow  If discussed at Long Length of Stay Meetings, dates discussed:    Additional Comments:  Cherylann ParrClaxton, Mattilynn Forrer S, RN 10/21/2016, 10:12 AM

## 2016-10-21 NOTE — Progress Notes (Signed)
SUBJECTIVE: The patient is making progress.  +incisional pain, but managed with medications.    CURRENT MEDICATIONS: . acetaminophen  1,000 mg Oral Q6H   Or  . acetaminophen (TYLENOL) oral liquid 160 mg/5 mL  1,000 mg Per Tube Q6H  . bisacodyl  10 mg Oral Daily   Or  . bisacodyl  10 mg Rectal Daily  . budesonide (PULMICORT) nebulizer solution  0.25 mg Nebulization BID  . chlorhexidine gluconate (MEDLINE KIT)  15 mL Mouth Rinse BID  . docusate sodium  200 mg Oral Daily  . feeding supplement (JEVITY 1.2 CAL)  1,000 mL Per Tube Q24H  . furosemide  20 mg Intravenous BID  . insulin aspart  0-24 Units Subcutaneous Q4H  . mouth rinse  15 mL Mouth Rinse QID  . metoprolol  5 mg Intravenous Q6H  . metoprolol tartrate  25 mg Oral BID  . pantoprazole  40 mg Oral Daily  . sodium chloride flush  10-40 mL Intracatheter Q12H  . sodium chloride flush  3 mL Intravenous Q12H   . sodium chloride Stopped (10/18/16 1400)  . sodium chloride Stopped (10/18/16 0701)  . sodium chloride Stopped (10/18/16 0700)  . amiodarone 30 mg/hr (10/21/16 0700)  . lactated ringers 10 mL/hr at 10/21/16 0500  . lactated ringers Stopped (10/20/16 0500)  . norepinephrine (LEVOPHED) Adult infusion Stopped (10/19/16 1300)    OBJECTIVE: Physical Exam: Vitals:   10/21/16 0500 10/21/16 0600 10/21/16 0700 10/21/16 0720  BP: 123/63 (!) 105/55    Pulse: 91 82 93   Resp: (!) 24 16 18    Temp:    98.3 F (36.8 C)  TempSrc:    Oral  SpO2: 98% 98% 100%   Weight: 231 lb 7.7 oz (105 kg)     Height:        Intake/Output Summary (Last 24 hours) at 10/21/16 0901 Last data filed at 10/21/16 0600  Gross per 24 hour  Intake           1335.7 ml  Output             1455 ml  Net           -119.3 ml    Telemetry reveals atrial fibrillation with ventricular pacing; underlying atrial fibrillation in the 50-60's  GEN- The patient is ill appearing, alert and oriented x 3 today.   Head- normocephalic, atraumatic Eyes-   Sclera clear, conjunctiva pink Ears- hearing intact Oropharynx- clear Neck- supple, hematoma resolved  Lungs- Clear to ausculation bilaterally, normal work of breathing, +chest tubes  Heart- Regular rate and rhythm (paced) GI- soft, NT, ND, + BS Extremities- no clubbing, cyanosis  Skin- no rash or lesion Psych- euthymic mood, full affect Neuro- strength and sensation are intact  LABS: Basic Metabolic Panel:  Recent Labs  10/18/16 1614  10/20/16 0423 10/20/16 1640 10/21/16 0404  NA  --   < > 139 140 140  K  --   < > 3.9 3.7 3.5  CL  --   < > 108 105 108  CO2  --   < > 25  --  25  GLUCOSE  --   < > 120* 124* 112*  BUN  --   < > 21* 23* 24*  CREATININE 1.08*  < > 1.26* 1.10* 1.17*  CALCIUM  --   < > 8.3*  --  8.2*  MG 2.0  --   --   --   --   < > = values  in this interval not displayed. Liver Function Tests: No results for input(s): AST, ALT, ALKPHOS, BILITOT, PROT, ALBUMIN in the last 72 hours. No results for input(s): LIPASE, AMYLASE in the last 72 hours. CBC:  Recent Labs  10/20/16 0423 10/20/16 1640 10/21/16 0404  WBC 11.9*  --  11.0*  HGB 10.1* 9.9* 9.9*  HCT 30.5* 29.0* 30.1*  MCV 91.3  --  92.0  PLT 139*  --  162     ASSESSMENT AND PLAN:  Active Problems:   Perforation of cardiac device  1.  Permanent atrial fibrillation S/p attempted Watchman placement 12/7. She developed profound hypotension during procedure with large pericardial effusion and tamponade. She was taken emergently to OR for sternotomy and repair of LAA. She also underwent LAA clipping at that time.  Making slow progress V rates mostly controlled  Appreciate TCTS' care   Chanetta Marshall, NP 10/21/2016 9:01 AM     Thompson Grayer MD, [facility] 10/21/2016 12:45 PM

## 2016-10-21 NOTE — Care Management Important Message (Signed)
Important Message  Patient Details  Name: Vernie AmmonsJoanne S Bansal MRN: [id number] Date of Birth: 1934-12-22   Medicare Important Message Given:  Yes    Kyla BalzarineShealy, Jeron Grahn Abena 10/21/2016, 9:55 AM

## 2016-10-21 NOTE — Progress Notes (Signed)
Speech Language Pathology Treatment: Dysphagia  Patient Details Name: [name] MRN: [id number] DOB: [birth year] Today's Date: 10/21/2016 Time: [id number] SLP Time Calculation (min) (ACUTE ONLY): 12 min  Assessment / Plan / Recommendation Clinical Impression  Pt's voice seems mildly stronger than on previous date, but she continues to cough with even single ice chips administered. Min cues provided for more effortful cough as pt has associated pain at incision site. Suspect that she will require temporary, alternative means of nutrition to facilitate overall strength; however, will proceed with FEES this afternoon to more objectively assess prior to possible NGT placement.   HPI HPI: 81 year old female with h/o permanent a-fib admitted 12/7 for implantation of left atrial appendage occluder. Brought to OR for emergency repair cardiac perforaction following attempted procedure. Remained sedated on vent and extubated 12/8.      SLP Plan  Other (Comment) (FEES)     Recommendations  Diet recommendations: NPO Medication Administration: Via alternative means                Oral Care Recommendations: Oral care QID Follow up Recommendations:  (tba) Plan: Other (Comment) (FEES)       GO                [name], [name] 10/21/2016, 9:47 AM  Maxcine HamLaura Paiewonsky, M.A. CCC-SLP [phone]

## 2016-10-21 NOTE — Progress Notes (Addendum)
Initial Nutrition Assessment  DOCUMENTATION CODES:   Obesity unspecified  INTERVENTION:    If TF started via CORTRAK small bore feeding tube, recommend Vital AF 1.2 formula at goal rate of 65 ml/hr   TF regimen to provide 1872 kcals, 117 gm protein, 1265 ml of free water  NUTRITION DIAGNOSIS:   Inadequate oral intake related to inability to eat as evidenced by NPO status  GOAL:   Patient will meet greater than or equal to 90% of their needs  MONITOR:   Diet advancement, PO intake, Labs, Weight trends, I & O's  REASON FOR ASSESSMENT:   Malnutrition Screening Tool  ASSESSMENT:   81 yo female with PMH of Chronic Afib, DM, obesity, HLD, HTN, RBBB, GIB, and amemia; admitted for planned procedure.   Pt s/p procedure 12/7. STERNOTOMY - Repair of Left Atrium  CLIPPING OF ATRIAL APPENDAGE   Extubated 12/8. Bedside swallow evaluation 12/9 >> SLP rec NPO status. Plan or FEES today >> if fails plan is to start TF. Labs and medications reviewed. CBG's [id number].  Diet Order:  Diet NPO time specified  Skin:  Reviewed, no issues  Last BM:  12/9  Height:   Ht Readings from Last 1 Encounters:  10/18/16 5\' 4"  (1.626 m)    Weight:   Wt Readings from Last 1 Encounters:  10/21/16 231 lb 7.7 oz (105 kg)    Ideal Body Weight:  54.5 kg  BMI:  Body mass index is 39.73 kg/m.  Estimated Nutritional Needs:   Kcal:  1800-2000  Protein:  110-120 gm  Fluid:  1.8-2.0 L  EDUCATION NEEDS:   No education needs identified at this time  Maureen ChattersKatie Jerzee Jerome, RD, LDN Pager #: [phone] After-Hours Pager #: [phone]

## 2016-10-21 NOTE — Procedures (Addendum)
Objective Swallowing Evaluation: Type of Study: FEES-Fiberoptic Endoscopic Evaluation of Swallow  Patient Details  Name: Julie AmmonsJoanne S Wurm MRN: [id number] Date of Birth: 1935-08-23  Today's Date: 10/21/2016 Time: SLP Start Time (ACUTE ONLY): 1317-SLP Stop Time (ACUTE ONLY): 1334 SLP Time Calculation (min) (ACUTE ONLY): 17 min  Past Medical History:  Past Medical History:  Diagnosis Date  . Anemia    a. 02/2014 in setting of GIB req 3U PRBC's.  . Cellulitis and abscess of leg, except foot   . Chronic a-fib (HCC)    a. CHA2DS2VASc = 5 (HTN, age > 7275, DM2, female);  b. Eliquis d/c'd 4/015 in setting of BRBPR/GIB;  c. Rate-control w/ BB.  . Diverticulosis   . Gastritis    a. 02/2014 EGD.  Marland Kitchen. GIB (gastrointestinal bleeding)    a. 02/2014 BRBPR;  b. 02/2014 Colonoscopy/EGD: diverticulosis with polys/polypectomy - ascending colon, Gastritis on EDG.  . HTN (hypertension)   . Hyperlipidemia   . Obesity, unspecified   . Personal history of colonic polyps   . Plantar fascial fibromatosis   . RBBB   . Type II or unspecified type diabetes mellitus without mention of complication, not stated as uncontrolled    Past Surgical History:  Past Surgical History:  Procedure Laterality Date  . CARDIAC CATHETERIZATION N/A 11/02/2016   Procedure: Pericardiocentesis;  Surgeon: Hillis RangeJames Allred, MD;  [facility];  Service: Cardiovascular;  Laterality: N/A;  . CLIPPING OF ATRIAL APPENDAGE N/A 11/08/2016   Procedure: CLIPPING OF ATRIAL APPENDAGE;  Surgeon: Alleen BorneBryan K Bartle, MD;  Location: [facility] OR;  Service: Open Heart Surgery;  Laterality: N/A;  . EMBOLIZATION N/A 03/15/2015   Procedure: Embolization;  Surgeon: Annice NeedyJason S Dew, MD;  Location: [facility] INVASIVE CV LAB;  Service: Cardiovascular;  Laterality: N/A;  . LEFT ATRIAL APPENDAGE OCCLUSION N/A 10/22/2016   Procedure: LEFT ATRIAL APPENDAGE OCCLUSION;  Surgeon: Hillis RangeJames Allred, MD;  Location: [facility] INVASIVE CV LAB;  Service: Cardiovascular;  Laterality: N/A;  . NO PAST  SURGERIES    . PERIPHERAL VASCULAR CATHETERIZATION N/A 03/15/2015   Procedure: Visceral Angiography;  Surgeon: Annice NeedyJason S Dew, MD;  Location: [facility] INVASIVE CV LAB;  Service: Cardiovascular;  Laterality: N/A;  . TEE WITHOUT CARDIOVERSION N/A 07/09/2016   Procedure: TRANSESOPHAGEAL ECHOCARDIOGRAM (TEE);  Surgeon: Lewayne BuntingBrian S Crenshaw, MD;  [facility];  Service: Cardiovascular;  Laterality: N/A;  . TEE WITHOUT CARDIOVERSION  07/18/2016   Procedure: Transesophageal Echocardiogram (Tee);  Surgeon: Lars MassonKatarina H Nelson, MD;  [facility];  Service: Cardiovascular;;   HPI: 81 year old female with h/o permanent a-fib admitted 12/7 for implantation of left atrial appendage occluder. Brought to OR for emergency repair cardiac perforaction following attempted procedure. Remained sedated on vent and extubated 12/8.  Subjective: pt alert, eager for something cold to drink   Assessment / Plan / Recommendation  CHL IP CLINICAL IMPRESSIONS 10/21/2016  Therapy Diagnosis Mild oral phase dysphagia;Severe pharyngeal phase dysphagia    Clinical Impression Pt has a mild oral and severe pharyngeal dysphagia due in large part to her significant weakness. Her pharynx and larynx have diffuse erythema, with particularly bright red coloration of almost the entire right true vocal fold. Only ice chip trials were given, with prolonged oral manipulation and delayed swallow initiation to the pyriform sinuses noted. Pt had severe generalized weakness with inability to protect her airway and clear boluses from her pharynx, even with such minimal amounts of PO.  She does not appear to sense penetrates, but she does have reflexive  coughing that is triggered as she aspirates. Unfortunately, her cough is not strong enough to be very effective even with additional cues from SLP. Recommend that pt remain NPO with SLP f/u for pharyngeal strengthening. Pt would benefit from short-term means of alternative nutrition to  facilitate strength rebuilding.    Impact on safety and function Severe aspiration risk      CHL IP TREATMENT RECOMMENDATION 10/21/2016  Treatment Recommendations Therapy as outlined in treatment plan below     Prognosis 10/21/2016  Prognosis for Safe Diet Advancement Good  Barriers to Reach Goals Severity of deficits  Barriers/Prognosis Comment --    CHL IP DIET RECOMMENDATION 10/21/2016  SLP Diet Recommendations NPO;Alternative means - temporary  Liquid Administration via --  Medication Administration Via alternative means  Compensations --  Postural Changes --      CHL IP OTHER RECOMMENDATIONS 10/21/2016  Recommended Consults --  Oral Care Recommendations Oral care QID  Other Recommendations --      CHL IP FOLLOW UP RECOMMENDATIONS 10/21/2016  Follow up Recommendations [facility]illed Nursing facility      [facility] D[facility] 10/21/2016  Speech Therapy Frequency (ACUTE ONLY) min 2x/week  Treatment Duration 2 weeks           CHL IP ORAL PHASE 10/21/2016  Oral Phase Impaired  Oral - Pudding Teaspoon --  Oral - Pudding Cup --  Oral - Honey Teaspoon --  Oral - Honey Cup --  Oral - Nectar Teaspoon --  Oral - Nectar Cup --  Oral - Nectar Straw --  Oral - Thin Teaspoon Weak lingual manipulation;Other (Comment)  Oral - Thin Cup --  Oral - Thin Straw --  Oral - Puree --  Oral - Mech Soft --  Oral - Regular --  Oral - Multi-Consistency --  Oral - Pill --  Oral Phase - Comment --    CHL IP PHARYNGEAL PHASE 10/21/2016  Pharyngeal Phase Impaired  Pharyngeal- Pudding Teaspoon --  Pharyngeal --  Pharyngeal- Pudding Cup --  Pharyngeal --  Pharyngeal- Honey Teaspoon --  Pharyngeal --  Pharyngeal- Honey Cup --  Pharyngeal --  Pharyngeal- Nectar Teaspoon --  Pharyngeal --  Pharyngeal- Nectar Cup --  Pharyngeal --  Pharyngeal- Nectar Straw --  Pharyngeal --  Pharyngeal- Thin Teaspoon Delayed swallow initiation-pyriform sinuses;Reduced pharyngeal  peristalsis;Reduced epiglottic inversion;Reduced anterior laryngeal mobility;Reduced laryngeal elevation;Reduced airway/laryngeal closure;Reduced tongue base retraction;Penetration/Aspiration during swallow;Penetration/Apiration after swallow;Pharyngeal residue - valleculae;Pharyngeal residue - pyriform;Pharyngeal residue - posterior pharnyx;Inter-arytenoid space residue;Lateral channel residue;Other (Comment)  Pharyngeal Material enters airway, passes BELOW cords and not ejected out despite cough attempt by patient  Pharyngeal- Thin Cup --  Pharyngeal --  Pharyngeal- Thin Straw --  Pharyngeal --  Pharyngeal- Puree --  Pharyngeal --  Pharyngeal- Mechanical Soft --  Pharyngeal --  Pharyngeal- Regular --  Pharyngeal --  Pharyngeal- Multi-consistency --  Pharyngeal --  Pharyngeal- Pill --  Pharyngeal --  Pharyngeal Comment --     CHL IP CERVICAL ESOPHAGEAL PHASE 10/21/2016  Cervical Esophageal Phase (No Data)  Pudding Teaspoon --  Pudding Cup --  Honey Teaspoon --  Honey Cup --  Nectar Teaspoon --  Nectar Cup --  Nectar Straw --  Thin Teaspoon --  Thin Cup --  Thin Straw --  Puree --  Mechanical Soft --  Regular --  Multi-consistency --  Pill --  Cervical Esophageal Comment --    No flowsheet data found.  [name], Jolea Dolle 10/21/2016, 2:45 PM   Maxcine HamLaura Paiewonsky, M.A. CCC-SLP [phone]

## 2016-10-21 NOTE — Progress Notes (Signed)
Patient ID: Vernie AmmonsJoanne S Frenz, female   DOB: 28-Dec-1934, 81 y.o.   MRN: [id number] EVENING ROUNDS NOTE :     [address]       [place] [id number]             [phone]                 4 Days Post-Op Procedure(s) (LRB): STERNOTOMY - Repair of Left Atrium (N/A) CLIPPING OF ATRIAL APPENDAGE (N/A)  Total Length of Stay:  LOS: 4 days  BP (!) 127/52   Pulse 79   Temp 97.7 F (36.5 C) (Oral)   Resp 14   Ht 5\' 4"  (1.626 m)   Wt 231 lb 7.7 oz (105 kg)   SpO2 100%   BMI 39.73 kg/m   .Intake/Output      12/10 0701 - 12/11 0700 12/11 0701 - 12/12 0700   I.V. (mL/kg) 960.8 (9.2) 260.3 (2.5)   IV Piggyback 465 50   Total Intake(mL/kg) 1425.8 (13.6) 310.3 (3)   Urine (mL/kg/hr) 1500 (0.6) 730 (0.6)   Chest Tube 30 (0)    Total Output 1530 730   Net -104.2 -419.7          . sodium chloride Stopped (10/18/16 1400)  . sodium chloride Stopped (10/18/16 0701)  . sodium chloride Stopped (10/18/16 0700)  . amiodarone 30 mg/hr (10/21/16 1600)  . lactated ringers 10 mL/hr at 10/21/16 1600  . lactated ringers Stopped (10/20/16 0500)     Lab Results  Component Value Date   WBC 11.0 (H) 10/21/2016   HGB 9.9 (L) 10/21/2016   HCT 30.1 (L) 10/21/2016   PLT 162 10/21/2016   GLUCOSE 112 (H) 10/21/2016   ALT 7 (L) 03/19/2015   AST 19 03/19/2015   NA 140 10/21/2016   K 3.5 10/21/2016   CL 108 10/21/2016   CREATININE 1.17 (H) 10/21/2016   BUN 24 (H) 10/21/2016   CO2 25 10/21/2016   INR 1.86 04-07-2016   Up in chair, very swollen bilaterial arms  Replacing K  Delight OvensEdward B Vernel Donlan MD  Beeper [phone] Office [phone] 10/21/2016 5:47 PM

## 2016-10-22 LAB — BASIC METABOLIC PANEL
Anion gap: 8 (ref 5–15)
BUN: 26 mg/dL — ABNORMAL HIGH (ref 6–20)
CALCIUM: 8.1 mg/dL — AB (ref 8.9–10.3)
CO2: 27 mmol/L (ref 22–32)
CREATININE: 0.99 mg/dL (ref 0.44–1.00)
Chloride: 108 mmol/L (ref 101–111)
GFR, EST NON AFRICAN AMERICAN: 53 mL/min — AB (ref >60)
GLUCOSE: 138 mg/dL — AB (ref 65–99)
Potassium: 3.4 mmol/L — ABNORMAL LOW (ref 3.5–5.1)
Sodium: 143 mmol/L (ref 135–145)

## 2016-10-22 LAB — CBC
HCT: 31.8 % — ABNORMAL LOW (ref 36.0–46.0)
HEMOGLOBIN: 10.4 g/dL — AB (ref 12.0–15.0)
MCH: 30.1 pg (ref 26.0–34.0)
MCHC: 32.7 g/dL (ref 30.0–36.0)
MCV: 92.2 fL (ref 78.0–100.0)
Platelets: 229 10*3/uL (ref 150–400)
RBC: 3.45 MIL/uL — AB (ref 3.87–5.11)
RDW: 20 % — ABNORMAL HIGH (ref 11.5–15.5)
WBC: 13 10*3/uL — AB (ref 4.0–10.5)

## 2016-10-22 LAB — POCT I-STAT, CHEM 8
BUN: 28 mg/dL — ABNORMAL HIGH (ref 6–20)
CALCIUM ION: 1.11 mmol/L — AB (ref 1.15–1.40)
Chloride: 106 mmol/L (ref 101–111)
Creatinine, Ser: 0.9 mg/dL (ref 0.44–1.00)
Glucose, Bld: 177 mg/dL — ABNORMAL HIGH (ref 65–99)
HCT: 32 % — ABNORMAL LOW (ref 36.0–46.0)
HEMOGLOBIN: 10.9 g/dL — AB (ref 12.0–15.0)
Potassium: 4.1 mmol/L (ref 3.5–5.1)
SODIUM: 145 mmol/L (ref 135–145)
TCO2: 26 mmol/L (ref 0–100)

## 2016-10-22 LAB — GLUCOSE, CAPILLARY
GLUCOSE-CAPILLARY: 116 mg/dL — AB (ref 65–99)
GLUCOSE-CAPILLARY: 147 mg/dL — AB (ref 65–99)
Glucose-Capillary: 127 mg/dL — ABNORMAL HIGH (ref 65–99)
Glucose-Capillary: 130 mg/dL — ABNORMAL HIGH (ref 65–99)
Glucose-Capillary: 130 mg/dL — ABNORMAL HIGH (ref 65–99)
Glucose-Capillary: 138 mg/dL — ABNORMAL HIGH (ref 65–99)
Glucose-Capillary: 148 mg/dL — ABNORMAL HIGH (ref 65–99)

## 2016-10-22 MED ORDER — SODIUM CHLORIDE 0.9 % IV SOLN
30.0000 meq | Freq: Once | INTRAVENOUS | Status: AC
  Administered 2016-10-22: 30 meq via INTRAVENOUS
  Filled 2016-10-22: qty 15

## 2016-10-22 MED ORDER — POTASSIUM CHLORIDE 20 MEQ/15ML (10%) PO SOLN
40.0000 meq | Freq: Two times a day (BID) | ORAL | Status: DC
  Administered 2016-10-22 – 2016-10-23 (×4): 40 meq
  Filled 2016-10-22 (×4): qty 30

## 2016-10-22 MED ORDER — VITAL AF 1.2 CAL PO LIQD
1000.0000 mL | ORAL | Status: DC
  Administered 2016-10-22: 1000 mL

## 2016-10-22 MED ORDER — ACETAMINOPHEN 160 MG/5ML PO SOLN
650.0000 mg | Freq: Four times a day (QID) | ORAL | Status: DC | PRN
Start: 2016-10-22 — End: 2016-10-28
  Administered 2016-10-22: 650 mg
  Filled 2016-10-22: qty 20.3

## 2016-10-22 MED ORDER — JEVITY 1.2 CAL PO LIQD: 1000.0000 mL | ORAL | Status: DC

## 2016-10-22 MED ORDER — METOPROLOL TARTRATE 25 MG/10 ML ORAL SUSPENSION
25.0000 mg | Freq: Two times a day (BID) | ORAL | Status: DC
  Administered 2016-10-22 (×2): 25 mg
  Filled 2016-10-22 (×2): qty 10

## 2016-10-22 MED ORDER — FUROSEMIDE 10 MG/ML IJ SOLN
40.0000 mg | Freq: Two times a day (BID) | INTRAMUSCULAR | Status: DC
  Administered 2016-10-22: 40 mg via INTRAVENOUS
  Filled 2016-10-22: qty 4

## 2016-10-22 NOTE — Progress Notes (Addendum)
Clinical Social Worker met patient at bedside to offer support and discuss patients needs at discharge. Patient was confused and unable to answer questions. CSW contacted Eda Paschal who was thought to be patient's POA. Ms. Carlos Levering stated that she is not the POA and "believes she might be the POA for medical needs but is not sure and does not want to state she is". Ms. Carlos Levering stated that patients friend Avis Epley [phone]) is the POA for patient and would be able to answer questions in regards to SNF. CSW contacted Ms. Stone and left voicemail to please contact CSW when available to discuss patients discharge plan.   Spoke to friend Avis Epley and she stated that she has POA over patients finances. Ms. Joaquim Lai stated she share POA over patient's medical needs with Eda Paschal.   Rhea Pink, MSW,  [place]

## 2016-10-22 NOTE — Progress Notes (Signed)
Speech Language Pathology Treatment: Dysphagia  Patient Details Name: [name] MRN: [id number] DOB: 05-17-1935 Today's Date: 10/22/2016 Time: [id number] SLP Time Calculation (min) (ACUTE ONLY): 18 min  Assessment / Plan / Recommendation Clinical Impression  Pt had just completed PT session and was feeling very tired but was still agreeable to participate in dysphagia therapy. She performed oral care via suction with immediate coughing noted, which subjectively seemed mildly stronger than on previous date. She still has frequent coughing with ice chip trials administered to facilitate effortful swallows. Pt had difficulty completing effortful swallows without a bolus to precipitate them, but she was able to complete 2 dry swallows. Additional exercises were not introduced today due to level of difficulty with effortful swallows and level of fatigue. Will continue to follow.   HPI HPI: 81 year old female with h/o permanent a-fib admitted 12/7 for implantation of left atrial appendage occluder. Brought to OR for emergency repair cardiac perforaction following attempted procedure. Remained sedated on vent and extubated 12/8.      SLP Plan  Continue with current plan of care     Recommendations  Diet recommendations: NPO Medication Administration: Via alternative means                Oral Care Recommendations: Oral care QID Follow up Recommendations: Skilled Nursing facility Plan: Continue with current plan of care       GO                [name], Blanchie Zeleznik 10/22/2016, 11:29 AM  Maxcine HamLaura Paiewonsky, M.A. CCC-SLP [phone]

## 2016-10-22 NOTE — Progress Notes (Signed)
Physical Therapy Treatment Patient Details Name: Julie AmmonsJoanne S [name] MRN: [id number] DOB: 07-Mar-1935 Today's Date: 10/22/2016    History of Present Illness Patient is a 81 yo female admitted 11/02/2016 following perforation of Lt atrium during procedure.  Patient now s/p emergent sternotomy for repair Lt atrium and clipping of atrial appendage.   Patient extubated 10/18/16.      PMH:  Chronic Afib, DM, obesity, HLD, HTN, RBBB, GIB, anemia    PT Comments    Pt remains severely deconditioned. Pt did tolerate 1 trial of standing with maxAx2 however only for 15 sec. SpO2 decreased into 80s and then required 2LO2 via [place] to recover into 90s. Pt given LE HEP, family present and to work on with her. Acute PT to follow.  Follow Up Recommendations  SNF;Supervision/Assistance - 24 hour     Equipment Recommendations       Recommendations for Other Services       Precautions / Restrictions Precautions Precautions: Fall;Sternal Precaution Comments: Reviewed sternal precautions Restrictions Weight Bearing Restrictions: No Other Position/Activity Restrictions: limited push/pull with UEs    Mobility  Bed Mobility               General bed mobility comments: pt up in chair upon PT arrival  Transfers Overall transfer level: Needs assistance Equipment used:  (2 person lift with bed pad) Transfers: Sit to/from Stand Sit to Stand: Max assist;+2 physical assistance         General transfer comment: pt hugged heart pillow, PT assisted via blocking knees and using bed pad while tech assisted via pushing up from pt's bottom to adhere to sternal precautions and manage pt's body habitus.. pt tolerated standing x 15 seconds, SpO2 dropped to 80s on RA and required 2Lo2 via [place] to recover into 90s  Ambulation/Gait                 Stairs            Wheelchair Mobility    Modified Rankin (Stroke Patients Only)       Balance                                     Cognition Arousal/Alertness: Awake/alert Behavior During Therapy: Flat affect Overall Cognitive Status: Impaired/Different from baseline     Current Attention Level: Sustained Memory: Decreased short-term memory Following Commands: Follows one step commands inconsistently;Follows one step commands with increased time     Problem Solving: Slow processing;Decreased initiation;Difficulty sequencing;Requires verbal cues;Requires tactile cues General Comments: pt slow to respond and comprehend task at hand, increased time required    Exercises General Exercises - Lower Extremity Ankle Circles/Pumps: AROM;Both;10 reps;Seated Quad Sets: AROM;Both;10 reps;Seated (with LEs extended) Gluteal Sets: AROM;Both;10 reps;Seated    General Comments General comments (skin integrity, edema, etc.): pt very edematous in bilat UEs and LEs, pt seeping from UEs      Pertinent Vitals/Pain Pain Assessment: Faces Faces Pain Scale: Hurts even more Pain Location: chest/incision area with coughing Pain Descriptors / Indicators: Grimacing;Guarding Pain Intervention(s): Limited activity within patient's tolerance    Home Living                      Prior Function            PT Goals (current goals can now be found in the care plan section) Acute Rehab PT Goals Patient Stated Goal: none stated Progress  towards PT goals: Progressing toward goals    Frequency    Min 3X/week      PT Plan Current plan remains appropriate    Co-evaluation             End of Session Equipment Utilized During Treatment: Oxygen Activity Tolerance: Patient limited by fatigue;Patient limited by pain Patient left: in chair;with call bell/phone within reach;with family/visitor present     Time: 0940-1006 PT Time Calculation (min) (ACUTE ONLY): 26 min  Charges:  $Therapeutic Exercise: 8-22 mins $Therapeutic Activity: 8-22 mins                    G Codes:      Ralonda Tartt M Delshon Blanchfield 10/22/2016, 1:28  PM   Lewis ShockAshly Barre Aydelott, PT, DPT Pager #: [phone] Office #: [phone]

## 2016-10-22 NOTE — Progress Notes (Signed)
Events noted Appreciate TCTS care  Hillis RangeJames Sanari Offner MD, [facility] 10/22/2016 11:42 AM

## 2016-10-22 NOTE — Clinical Social Work Placement (Signed)
   CLINICAL SOCIAL WORK PLACEMENT  NOTE  Date:  10/22/2016  Patient Details  Name: [name] MRN: [id number] Date of Birth: Sep 04, 1935  Clinical Social Work is seeking post-discharge placement for this patient at the Skilled  Nursing Facility level of care (*CSW will initial, date and re-position this form in  chart as items are completed):  Yes   Patient/family provided with [facility] Clinical Social Work Department's list of facilities offering this level of care within the geographic area requested by the patient (or if unable, by the patient's family).  Yes   Patient/family informed of their freedom to choose among providers that offer the needed level of care, that participate in Medicare, Medicaid or managed care program needed by the patient, have an available bed and are willing to accept the patient.  Yes   Patient/family informed of [facility] ownership interest in [facility] and [facility], as well as of the fact that they are under no obligation to receive care at these facilities.  PASRR submitted to EDS on       PASRR number received on       Existing PASRR number confirmed on       FL2 transmitted to all facilities in geographic area requested by pt/family on       FL2 transmitted to all facilities within larger geographic area on       Patient informed that his/her managed care company has contracts with or will negotiate with certain facilities, including the following:            Patient/family informed of bed offers received.  Patient chooses bed at       Physician recommends and patient chooses bed at      Patient to be transferred to   on  .  Patient to be transferred to facility by       Patient family notified on   of transfer.  Name of family member notified:        PHYSICIAN Please sign FL2     Additional Comment:    _______________________________________________ Althea CharonAshley C Jayme Cham, LCSW 10/22/2016, 4:49 PM

## 2016-10-22 NOTE — Clinical Social Work Note (Signed)
Clinical Social Work Assessment  Patient Details  Name: [name] MRN: [id number] Date of Birth: 08/27/1935  Date of referral:  10/22/16               Reason for consult:  Discharge Planning                Permission sought to share information with:  Family Supports Permission granted to share information::  Yes, Verbal Permission Granted  Name::     [name]  Agency::     Relationship::  Friend (POA)  Contact Information:  [phone]  Housing/Transportation Living arrangements for the past 2 months:  Apartment Source of Information:  Power of Attorney Patient Interpreter Needed:  None Criminal Activity/Legal Involvement Pertinent to Current Situation/Hospitalization:  No - Comment as needed Significant Relationships:  Friend Lives with:  Self Do you feel safe going back to the place where you live?  Yes Need for family participation in patient care:  Yes (Comment)  Care giving concerns:  No family/friends at bedside  Social Worker assessment / plan:  Clinical Social Worker met patient at bedside to offer support and discuss needs at discharge. Patient  Was unable to do assessment, patient is not completely oriented. CSW contacted patients friend who is believe to be her POA. CSW spoke to [name] who confirmed that she has POA over patients finances and Julie shares POA over patients health with friend [name]. CSW asked ms. [name] to bring a copy of POA for [name] records. Ms. [name] stated she does not mind and will bring copy later on today.  Ms. [name] states she is agreeable for SNF placement for patient and would prefer placement be in [place]. CSW to complete necessary paperwork and initiate SNF search on patients behalf. CSW remains available for support and to facilitate patient discharge needs once medically stable.  Employment status:  Retired Insurance information:  Medicare PT Recommendations:  Skilled Nursing Facility Information / Referral to  community resources:  Skilled Nursing Facility  Patient/Family's Response to care: Patient's friend verbalized appreciation and understanding for CSW role and involvement in care. Patient agreeable with current discharge plan for SNF following discharge.   Patient/Family's Understanding of and Emotional Response to Diagnosis, Current Treatment, and Prognosis:  Patient's friend with good understanding of current medical state and limitations around most recent hospitalization. Patient friend is agreeable for patient to be placed in SNF facility in hopes of transitioning back home.   Emotional Assessment Appearance:  Appears stated age Attitude/Demeanor/Rapport:  Other (aggreassive/demeanor appropriate) Affect (typically observed):  Unable to Assess Orientation:  Oriented to Self, Oriented to  Time Alcohol / Substance use:  Not Applicable Psych involvement (Current and /or in the community):  No (Comment)  Discharge Needs  Concerns to be addressed:  No discharge needs identified Readmission within the last 30 days:  No Current discharge risk:  Lives alone Barriers to Discharge:  No Barriers Identified   [name], MSW,  LCSWA [phone]   

## 2016-10-22 NOTE — Progress Notes (Addendum)
Nutrition Brief Note  Jevity 1.2 formula started 12/11 and currently infusing at 40 ml/hr via CORTRAK small bore feeding tube (tip at duodneum).  Verbal with Read Back order received per Dr. Laneta SimmersBartle to change TF formula to better meet pt's estimated nutrition needs.  Adult Tube Feeding Protocol also ordered.  Maureen ChattersKatie Raheem Kolbe, RD, LDN Pager #: [phone] After-Hours Pager #: [phone]

## 2016-10-22 NOTE — Progress Notes (Signed)
5 Days Post-Op Procedure(s) (LRB): STERNOTOMY - Repair of Left Atrium (N/A) CLIPPING OF ATRIAL APPENDAGE (N/A) Subjective:  No specific complaints. Stood with PT this am.  Objective: Vital signs in last 24 hours: Temp:  [97.7 F (36.5 C)-98.9 F (37.2 C)] 98.9 F (37.2 C) (12/12 0700) Pulse Rate:  [68-141] 95 (12/12 0900) Cardiac Rhythm: Atrial fibrillation (12/12 0851) Resp:  [0-29] 22 (12/12 0900) BP: (105-156)/(52-81) 149/57 (12/12 0900) SpO2:  [89 %-100 %] 94 % (12/12 0925) Weight:  [103.2 kg (227 lb 8.2 oz)] 103.2 kg (227 lb 8.2 oz) (12/12 0500)  Hemodynamic parameters for last 24 hours:    Intake/Output from previous day: 12/11 0701 - 12/12 0700 In: 1315.4 [I.V.:634.1; NG/GT:551.3; IV Piggyback:100] Out: 1925 [Urine:1925] Intake/Output this shift: Total I/O In: 515.1 [I.V.:90.1; NG/GT:160; IV Piggyback:265] Out: 80 [Urine:80]  General appearance: alert and cooperative this am, looks weak though. Neurologic: intact Heart: irregularly irregular rhythm Lungs: diminished breath sounds bibasilar Extremities: edema moderate anasarca Wound: incision ok  Lab Results:  Recent Labs  10/20/16 0423  10/21/16 0404 10/21/16 1744  WBC 11.9*  --  11.0*  --   HGB 10.1*  < > 9.9* 10.2*  HCT 30.5*  < > 30.1* 30.0*  PLT 139*  --  162  --   < > = values in this interval not displayed. BMET:  Recent Labs  10/21/16 0404 10/21/16 1744 10/22/16 0420  NA 140 143 143  K 3.5 3.6 3.4*  CL 108 105 108  CO2 25  --  27  GLUCOSE 112* 112* 138*  BUN 24* 27* 26*  CREATININE 1.17* 1.10* 0.99  CALCIUM 8.2*  --  8.1*    PT/INR: No results for input(s): LABPROT, INR in the last 72 hours. ABG    Component Value Date/Time   PHART 7.347 (L) 10/18/2016 1355   HCO3 21.3 10/18/2016 1355   TCO2 25 10/21/2016 1744   ACIDBASEDEF 4.0 (H) 10/18/2016 1355   O2SAT 98.0 10/18/2016 1355   CBG (last 3)   Recent Labs  10/22/16 0100 10/22/16 0341 10/22/16 0725  GLUCAP 130* 127* 116*     Assessment/Plan: S/P Procedure(s) (LRB): STERNOTOMY - Repair of Left Atrium (N/A) CLIPPING OF ATRIAL APPENDAGE (N/A)  She is hemodynamically stable in permanent atrial fibrillation with rate controlled on IV amiodarone. Will plan to transition to metoprolol at some point.  Volume excess: continue diuresis: weight is about 31 lbs over preop if accurate.  Chronic deconditioning: continue PT  Postop dysphagia: probably secondary to TEE and ETT, swelling in neck from hematoma. Continue tube feeds for now and plan speech therapy followup.   LOS: 5 days    Alleen BorneBryan K Bartle 10/22/2016

## 2016-10-22 NOTE — Progress Notes (Addendum)
[facility] BRIEF SICU PROGRESS NOTE  5 Days Post-Op  S/P Procedure(s) (LRB): STERNOTOMY - Repair of Left Atrium (N/A) CLIPPING OF ATRIAL APPENDAGE (N/A)   Reportedly more confused, perhaps worse after receiving morphine Afibe w/ HR 110 BP stable Breathing comfortably w/ O2 sats 92-94% on 2 L/min UOP adequate  Plan: Continue current plan  Julie Nailslarence H Shavontae Gibeault, MD 10/22/2016 6:39 PM

## 2016-10-23 ENCOUNTER — Inpatient Hospital Stay (HOSPITAL_COMMUNITY): Payer: Medicare Other

## 2016-10-23 DIAGNOSIS — J9601 Acute respiratory failure with hypoxia: Secondary | ICD-10-CM

## 2016-10-23 DIAGNOSIS — J189 Pneumonia, unspecified organism: Secondary | ICD-10-CM

## 2016-10-23 DIAGNOSIS — J9621 Acute and chronic respiratory failure with hypoxia: Secondary | ICD-10-CM

## 2016-10-23 DIAGNOSIS — J810 Acute pulmonary edema: Secondary | ICD-10-CM

## 2016-10-23 DIAGNOSIS — K921 Melena: Secondary | ICD-10-CM

## 2016-10-23 LAB — GLUCOSE, CAPILLARY
GLUCOSE-CAPILLARY: 84 mg/dL (ref 65–99)
Glucose-Capillary: 106 mg/dL — ABNORMAL HIGH (ref 65–99)
Glucose-Capillary: 144 mg/dL — ABNORMAL HIGH (ref 65–99)
Glucose-Capillary: 148 mg/dL — ABNORMAL HIGH (ref 65–99)
Glucose-Capillary: 90 mg/dL (ref 65–99)
Glucose-Capillary: 98 mg/dL (ref 65–99)

## 2016-10-23 LAB — CBC
HCT: 32.4 % — ABNORMAL LOW (ref 36.0–46.0)
HEMATOCRIT: 31.1 % — AB (ref 36.0–46.0)
HEMOGLOBIN: 10.2 g/dL — AB (ref 12.0–15.0)
Hemoglobin: 10.7 g/dL — ABNORMAL LOW (ref 12.0–15.0)
MCH: 30.4 pg (ref 26.0–34.0)
MCH: 30.4 pg (ref 26.0–34.0)
MCHC: 33 g/dL (ref 30.0–36.0)
MCHC: 32.8 g/dL (ref 30.0–36.0)
MCV: 92 fL (ref 78.0–100.0)
MCV: 92.8 fL (ref 78.0–100.0)
PLATELETS: 277 10*3/uL (ref 150–400)
Platelets: 269 10*3/uL (ref 150–400)
RBC: 3.52 MIL/uL — ABNORMAL LOW (ref 3.87–5.11)
RBC: 3.35 MIL/uL — ABNORMAL LOW (ref 3.87–5.11)
RDW: 20.1 % — AB (ref 11.5–15.5)
RDW: 20.1 % — ABNORMAL HIGH (ref 11.5–15.5)
WBC: 16.3 10*3/uL — AB (ref 4.0–10.5)
WBC: 16.6 10*3/uL — ABNORMAL HIGH (ref 4.0–10.5)

## 2016-10-23 LAB — BASIC METABOLIC PANEL
Anion gap: 8 (ref 5–15)
BUN: 30 mg/dL — AB (ref 6–20)
CHLORIDE: 110 mmol/L (ref 101–111)
CO2: 28 mmol/L (ref 22–32)
CREATININE: 0.92 mg/dL (ref 0.44–1.00)
Calcium: 8.1 mg/dL — ABNORMAL LOW (ref 8.9–10.3)
GFR calc Af Amer: >60 mL/min (ref >60)
GFR calc non Af Amer: 58 mL/min — ABNORMAL LOW (ref >60)
GLUCOSE: 144 mg/dL — AB (ref 65–99)
Potassium: 4.3 mmol/L (ref 3.5–5.1)
SODIUM: 146 mmol/L — AB (ref 135–145)

## 2016-10-23 LAB — PROCALCITONIN: Procalcitonin: 0.34 ng/mL

## 2016-10-23 LAB — POCT I-STAT 3, ART BLOOD GAS (G3+)
Acid-Base Excess: 7 mmol/L — ABNORMAL HIGH (ref 0.0–2.0)
BICARBONATE: 31.9 mmol/L — AB (ref 20.0–28.0)
O2 Saturation: 100 %
PCO2 ART: 48.2 mmHg — AB (ref 32.0–48.0)
Patient temperature: 98.3
TCO2: 33 mmol/L (ref 0–100)
pH, Arterial: 7.428 (ref 7.350–7.450)
pO2, Arterial: 361 mmHg — ABNORMAL HIGH (ref 83.0–108.0)

## 2016-10-23 LAB — LACTIC ACID, PLASMA: Lactic Acid, Venous: 1.8 mmol/L (ref 0.5–1.9)

## 2016-10-23 MED ORDER — SODIUM CHLORIDE 0.9 % IV SOLN
8.0000 mg/h | INTRAVENOUS | Status: DC
  Administered 2016-10-23 – 2016-10-25 (×4): 8 mg/h via INTRAVENOUS
  Filled 2016-10-23 (×10): qty 80

## 2016-10-23 MED ORDER — MIDAZOLAM HCL 2 MG/2ML IJ SOLN
4.0000 mg | Freq: Once | INTRAMUSCULAR | Status: AC
  Administered 2016-10-23: 4 mg via INTRAVENOUS

## 2016-10-23 MED ORDER — ETOMIDATE 2 MG/ML IV SOLN
INTRAVENOUS | Status: AC
  Administered 2016-10-23: 20 mg
  Filled 2016-10-23: qty 10

## 2016-10-23 MED ORDER — FENTANYL BOLUS VIA INFUSION
25.0000 ug | INTRAVENOUS | Status: DC | PRN
  Administered 2016-10-24 – 2016-10-29 (×15): 25 ug via INTRAVENOUS
  Filled 2016-10-23: qty 25

## 2016-10-23 MED ORDER — FENTANYL CITRATE (PF) 100 MCG/2ML IJ SOLN
INTRAMUSCULAR | Status: AC
  Administered 2016-10-23: 100 ug
  Filled 2016-10-23: qty 2

## 2016-10-23 MED ORDER — FUROSEMIDE 10 MG/ML IJ SOLN
40.0000 mg | Freq: Once | INTRAMUSCULAR | Status: AC
  Administered 2016-10-23: 40 mg via INTRAVENOUS
  Filled 2016-10-23: qty 4

## 2016-10-23 MED ORDER — DEXTROSE 5 % IV SOLN
2.0000 g | Freq: Three times a day (TID) | INTRAVENOUS | Status: DC
  Administered 2016-10-23 – 2016-10-28 (×16): 2 g via INTRAVENOUS
  Filled 2016-10-23 (×18): qty 2

## 2016-10-23 MED ORDER — MIDAZOLAM HCL 2 MG/2ML IJ SOLN
1.0000 mg | INTRAMUSCULAR | Status: DC | PRN
  Administered 2016-10-23: 1 mg via INTRAVENOUS
  Filled 2016-10-23: qty 2

## 2016-10-23 MED ORDER — FUROSEMIDE 10 MG/ML IJ SOLN
40.0000 mg | Freq: Once | INTRAMUSCULAR | Status: AC
  Administered 2016-10-24: 40 mg via INTRAVENOUS
  Filled 2016-10-23: qty 4

## 2016-10-23 MED ORDER — PANTOPRAZOLE SODIUM 40 MG IV SOLR
40.0000 mg | Freq: Two times a day (BID) | INTRAVENOUS | Status: DC
  Administered 2016-10-26 – 2016-10-28 (×4): 40 mg via INTRAVENOUS
  Filled 2016-10-23 (×4): qty 40

## 2016-10-23 MED ORDER — SODIUM CHLORIDE 0.9 % IV SOLN
80.0000 mg | Freq: Once | INTRAVENOUS | Status: AC
  Administered 2016-10-23: 13:00:00 80 mg via INTRAVENOUS
  Filled 2016-10-23: qty 80

## 2016-10-23 MED ORDER — ORAL CARE MOUTH RINSE
15.0000 mL | Freq: Four times a day (QID) | OROMUCOSAL | Status: DC
  Administered 2016-10-23 – 2016-10-24 (×4): 15 mL via OROMUCOSAL

## 2016-10-23 MED ORDER — CHLORHEXIDINE GLUCONATE 0.12% ORAL RINSE (MEDLINE KIT)
15.0000 mL | Freq: Two times a day (BID) | OROMUCOSAL | Status: DC
  Administered 2016-10-24 – 2016-10-29 (×10): 15 mL via OROMUCOSAL

## 2016-10-23 MED ORDER — FUROSEMIDE 10 MG/ML IJ SOLN
80.0000 mg | Freq: Two times a day (BID) | INTRAMUSCULAR | Status: DC
  Administered 2016-10-23: 80 mg via INTRAVENOUS
  Filled 2016-10-23: qty 8

## 2016-10-23 MED ORDER — FENTANYL CITRATE (PF) 100 MCG/2ML IJ SOLN: 50.0000 ug | INTRAMUSCULAR | Status: DC | PRN

## 2016-10-23 MED ORDER — NOREPINEPHRINE BITARTRATE 1 MG/ML IV SOLN
2.0000 ug/min | INTRAVENOUS | Status: DC
  Administered 2016-10-23: 2 ug/min via INTRAVENOUS
  Filled 2016-10-23: qty 4

## 2016-10-23 MED ORDER — SODIUM CHLORIDE 0.9 % IV SOLN
250.0000 ug/h | INTRAVENOUS | Status: DC
  Administered 2016-10-23: 50 ug/h via INTRAVENOUS
  Administered 2016-10-25: 100 ug/h via INTRAVENOUS
  Administered 2016-10-25 – 2016-10-26 (×2): 200 ug/h via INTRAVENOUS
  Administered 2016-10-29: 250 ug/h via INTRAVENOUS
  Administered 2016-10-29: 175 ug/h via INTRAVENOUS
  Filled 2016-10-23 (×8): qty 50

## 2016-10-23 MED ORDER — CHLORHEXIDINE GLUCONATE 0.12% ORAL RINSE (MEDLINE KIT)
15.0000 mL | Freq: Two times a day (BID) | OROMUCOSAL | Status: DC
  Administered 2016-10-23 – 2016-10-24 (×3): 15 mL via OROMUCOSAL

## 2016-10-23 MED ORDER — METOPROLOL TARTRATE 25 MG/10 ML ORAL SUSPENSION
50.0000 mg | Freq: Two times a day (BID) | ORAL | Status: DC
  Administered 2016-10-23 (×2): 50 mg
  Filled 2016-10-23 (×2): qty 20

## 2016-10-23 MED ORDER — ORAL CARE MOUTH RINSE
15.0000 mL | Freq: Four times a day (QID) | OROMUCOSAL | Status: DC
  Administered 2016-10-24 – 2016-10-29 (×22): 15 mL via OROMUCOSAL

## 2016-10-23 NOTE — Progress Notes (Addendum)
6 Days Post-Op Procedure(s) (LRB): STERNOTOMY - Repair of Left Atrium (N/A) CLIPPING OF ATRIAL APPENDAGE (N/A) Subjective:  No complaints  Objective: Vital signs in last 24 hours: Temp:  [97.9 F (36.6 C)-99.1 F (37.3 C)] 98.3 F (36.8 C) (12/13 0700) Pulse Rate:  [25-118] 95 (12/13 0800) Cardiac Rhythm: Atrial fibrillation (12/13 0800) Resp:  [22-37] 29 (12/13 0800) BP: (99-174)/(50-112) 149/70 (12/13 0800) SpO2:  [85 %-100 %] 98 % (12/13 0833) Weight:  [101.6 kg (223 lb 15.8 oz)] 101.6 kg (223 lb 15.8 oz) (12/13 0500)  Hemodynamic parameters for last 24 hours:    Intake/Output from previous day: 12/12 0701 - 12/13 0700 In: 2345.4 [I.V.:624.1; NG/GT:1406.3; IV Piggyback:315] Out: 2545 [Urine:2545] Intake/Output this shift: No intake/output data recorded.  General appearance: alert and confused, increased work of breathing Neurologic: intact Heart: irregularly irregular rhythm Lungs: rhonchi bilaterally Abdomen: soft, non-tender; bowel sounds normal; no masses,  no organomegaly Extremities: edema moderate anasarca improving Wound: incision ok  Lab Results:  Recent Labs  10/22/16 1124 10/22/16 1550 10/23/16 0430  WBC 13.0*  --  16.3*  HGB 10.4* 10.9* 10.7*  HCT 31.8* 32.0* 32.4*  PLT 229  --  277   BMET:  Recent Labs  10/22/16 0420 10/22/16 1550 10/23/16 0430  NA 143 145 146*  K 3.4* 4.1 4.3  CL 108 106 110  CO2 27  --  28  GLUCOSE 138* 177* 144*  BUN 26* 28* 30*  CREATININE 0.99 0.90 0.92  CALCIUM 8.1*  --  8.1*    PT/INR: No results for input(s): LABPROT, INR in the last 72 hours. ABG    Component Value Date/Time   PHART 7.347 (L) 10/18/2016 1355   HCO3 21.3 10/18/2016 1355   TCO2 26 10/22/2016 1550   ACIDBASEDEF 4.0 (H) 10/18/2016 1355   O2SAT 98.0 10/18/2016 1355   CBG (last 3)   Recent Labs  10/22/16 2350 10/23/16 0411 10/23/16 0735  GLUCAP 148* 144* 148*   CLINICAL DATA:  Shortness of breath.  EXAM: PORTABLE CHEST 1  VIEW  COMPARISON:  10/21/2016.  FINDINGS: Feeding tube noted with its tip below the left hemidiaphragm. Right PICC line stable position. Prior CABG. Left atrial appendage clip. Cardiomegaly with diffuse bilateral pulmonary infiltrates/edema and pleural effusions. Findings have progressed from prior exam . No pneumothorax.  IMPRESSION: 1. Feeding tube noted with its tip below left hemidiaphragm. Right PICC line stable position.  2. Prior CABG. Cardiomegaly. Diffuse bilateral pulmonary alveolar infiltrates/edema with bilateral pleural effusions noted. Findings have progressed from prior exam.   Electronically Signed   By: Maisie Fushomas  Register   On: 10/23/2016 07:58  Assessment/Plan: S/P Procedure(s) (LRB): STERNOTOMY - Repair of Left Atrium (N/A) CLIPPING OF ATRIAL APPENDAGE (N/A)  She is hemodynamically stable in rate controlled atrial fibrillation. I am concerned that she is developing pneumonia with worsening of CXR appearance, increased leukocytosis, no cough and poor effort on IS and flutter valve. Will start empiric Maxipime. I am going to stop amiodarone with lung appearance and increased Lopressor for rate control. Continue to encourage pulmonary toilet but she is so weak she just won't do it and may end up reintubated. NT suction as needed. Continue tube feeds. PT. She still has significant total body volume excess. Will give additional lasix today.   LOS: 6 days    Alleen BorneBryan K Bartle 10/23/2016

## 2016-10-23 NOTE — Procedures (Signed)
Intubation Procedure Note Vernie AmmonsJoanne S Frandsen [id number] 1935/06/06  Procedure: Intubation Indications: Airway protection and maintenance  Procedure Details Consent: Unable to obtain consent because of altered level of consciousness. Time Out: Verified patient identification, verified procedure, site/side was marked, verified correct patient position, special equipment/implants available, medications/allergies/relevent history reviewed, required imaging and test results available.  Performed  Maximum sterile technique was used including gloves, gown, hand hygiene and mask.  MAC and 3    Evaluation Hemodynamic Status: Transient hypotension treated with pressors; O2 sats: stable throughout Patient's Current Condition: stable Complications: No apparent complications Patient did tolerate procedure well. Chest X-ray ordered to verify placement.  CXR: pending.   PT intubated for airway protection using Glidescope #3 blade with 7.5 ETT secured at 24 @ Lip.  PT stable throughout with no complications.  Ventilator settings per MD order.  Positive color change noted on ETCO2 detector, bilateral breath sounds, direct visualization, CXR pending.  RT will continue to monitor.   Carolan ShiverKelley, Shilo Pauwels M 10/23/2016

## 2016-10-23 NOTE — Consult Note (Addendum)
WOC Nurse wound consult note Reason for Consult: Consult requested for buttocks for possible deep tissue injury.  Pt has a dark purple area which is a round 2X1cm area to the right inner buttock. Shape and location are not consistent with a deep tissue injury; which usually has irregular boarders and would be located over a bony prominence. Appearance is consistent with a bruise which occurred previously from unknown etiology. Dressing procedure/placement/frequency: Foam dressing to protect from further injury.  Pt is on a Sport low airloss bed to reduce pressure. She is incontinent of loose stool and a fecal incontinence collector has been placed to protect skin; she was previously reported to hacve moisture associated skin damage to bilat buttocks.  Inner groin is red and macerated from MASD; antifungal powder to promote healing. She has multiple systemic factors which could cause the bruise to decline despite optimal plan of care. No family members present to discuss plan of care. Please re-consult if further assistance is needed.  Thank-you,  Cammie Mcgeeawn Tekeisha Hakim MSN, RN, CWOCN, [place], CNS [phone]

## 2016-10-23 NOTE — Consult Note (Signed)
PCCM CONSULT NOTE  Admission date: 10/15/2016 Consult date: 10/23/2016 Referring provider: Dr. Laneta SimmersBartle  CC: Short of breath  HPI: 81 yo female presented May 05, 2016 for implantation of Lt atrial appendage occluder.  She has permanent A fib on eliquis.  She developed hypotension post procedure from pericardial effusion and tamponade physiology.  She had emergent sternotomy with clipping of atrial appendage.  She improved after surgery and was extubated.  She developed worsening hypoxia with pulmonary infiltrates on 12/13 and required intubation.  She was started on Abx, and continued on diuresis.  She was hypotensive post-procedure and noted to have maroon colored stool.  She has hx of GI bleeding.  PMHx: She  has a past medical history of Anemia; Cellulitis and abscess of leg, except foot; Chronic a-fib (HCC); Diverticulosis; Gastritis; GIB (gastrointestinal bleeding); HTN (hypertension); Hyperlipidemia; Obesity, unspecified; Personal history of colonic polyps; Plantar fascial fibromatosis; RBBB; and Type II or unspecified type diabetes mellitus without mention of complication, not stated as uncontrolled.  PSHx: She  has a past surgical history that includes No past surgeries; Cardiac catheterization (N/A, 03/15/2015); embolization (N/A, 03/15/2015); TEE without cardioversion (N/A, 07/09/2016); TEE without cardioversion (07/18/2016); Left Atrial Appendage Occlusion (N/A, 10/16/2016); Cardiac catheterization (N/A, 10/19/2016); and Clipping of atrial appendage (N/A, 10/19/2016).  FHx: Her family history includes Asthma in her father; Emphysema in her father; Heart attack in her father; Hypertension in her mother.  SHx: She  reports that she has never smoked. She has never used smokeless tobacco. She reports that she does not drink alcohol or use drugs.   Allergies  Allergen Reactions  . Diltiazem Hcl Other (See Comments)    REACTION: SOB/swelling    No current facility-administered medications on file  prior to encounter.    Current Outpatient Prescriptions on File Prior to Encounter  Medication Sig  . doxazosin (CARDURA) 4 MG tablet Take 4 mg by mouth daily.   . furosemide (LASIX) 40 MG tablet Take 40 mg by mouth as directed. Once daily but patient may take an additional dose in the afternoon for fluid retention.  . Magnesium 250 MG TABS Take 250 mg by mouth daily.   Marland Kitchen. albuterol (PROVENTIL HFA) 108 (90 BASE) MCG/ACT inhaler Inhale 2 puffs into the lungs every 6 (six) hours as needed for wheezing or shortness of breath.   . fluticasone (FLONASE) 50 MCG/ACT nasal spray Place 2 sprays into both nostrils 2 (two) times daily as needed for allergies.   . Fluticasone-Salmeterol (ADVAIR DISKUS) 250-50 MCG/DOSE AEPB Inhale 1 puff into the lungs daily as needed (for shortness of breath or wheezing).    ROS: Unable to obtain.  Vital signs: BP (!) 92/44   Pulse 85   Temp 98.3 F (36.8 C) (Oral)   Resp (!) 21   Ht 5\' 4"  (1.626 m)   Wt 223 lb 15.8 oz (101.6 kg)   SpO2 99%   BMI 38.45 kg/m   Intake/output: I/O last 3 completed shifts: In: 3219.1 [I.V.:917.8; Other:30; NG/GT:1906.3; IV Piggyback:365] Out: 3610 [Urine:3610]  General: ill appearing Neuro: alert, confused, moves extremities HEENT: pupils reactive, NG tube in place, no stridor Cardiac: sternal wound clean, irregular Chest: b/l crackles Abd: soft, non tender Ext: 1+ edema Skin: multiple areas of ecchymosis   CMP Latest Ref Rng & Units 10/23/2016 10/22/2016 10/22/2016  Glucose 65 - 99 mg/dL 409(W144(H) 119(J177(H) 478(G138(H)  BUN 6 - 20 mg/dL 95(A30(H) 21(H28(H) 08(M26(H)  Creatinine 0.44 - 1.00 mg/dL 5.780.92 4.690.90 6.290.99  Sodium 135 - 145 mmol/L 146(H) 145 143  Potassium 3.5 - 5.1 mmol/L 4.3 4.1 3.4(L)  Chloride 101 - 111 mmol/L 110 106 108  CO2 22 - 32 mmol/L 28 - 27  Calcium 8.9 - 10.3 mg/dL 8.1(L) - 8.1(L)  Total Protein 6.5 - 8.1 g/dL - - -  Total Bilirubin 0.3 - 1.2 mg/dL - - -  Alkaline Phos 38 - 126 U/L - - -  AST 15 - 41 U/L - - -  ALT  14 - 54 U/L - - -    CBC Latest Ref Rng & Units 10/23/2016 10/22/2016 10/22/2016  WBC 4.0 - 10.5 K/uL 16.3(H) - 13.0(H)  Hemoglobin 12.0 - 15.0 g/dL 10.7(L) 10.9(L) 10.4(L)  Hematocrit 36.0 - 46.0 % 32.4(L) 32.0(L) 31.8(L)  Platelets 150 - 400 K/uL 277 - 229    ABG    Component Value Date/Time   PHART 7.347 (L) 10/18/2016 1355   PCO2ART 38.8 10/18/2016 1355   PO2ART 110.0 (H) 10/18/2016 1355   HCO3 21.3 10/18/2016 1355   TCO2 26 10/22/2016 1550   ACIDBASEDEF 4.0 (H) 10/18/2016 1355   O2SAT 98.0 10/18/2016 1355    CBG (last 3)   Recent Labs  10/22/16 2350 10/23/16 0411 10/23/16 0735  GLUCAP 148* 144* 148*     Imaging: Dg Chest Port 1 View  Result Date: 10/23/2016 CLINICAL DATA:  Shortness of breath. EXAM: PORTABLE CHEST 1 VIEW COMPARISON:  10/21/2016. FINDINGS: Feeding tube noted with its tip below the left hemidiaphragm. Right PICC line stable position. Prior CABG. Left atrial appendage clip. Cardiomegaly with diffuse bilateral pulmonary infiltrates/edema and pleural effusions. Findings have progressed from prior exam . No pneumothorax. IMPRESSION: 1. Feeding tube noted with its tip below left hemidiaphragm. Right PICC line stable position. 2. Prior CABG. Cardiomegaly. Diffuse bilateral pulmonary alveolar infiltrates/edema with bilateral pleural effusions noted. Findings have progressed from prior exam. Electronically Signed   By: Maisie Fushomas  Register   On: 10/23/2016 07:58   Dg Abd Portable 1v  Result Date: 10/21/2016 CLINICAL DATA:  Nasogastric tube placement. EXAM: PORTABLE ABDOMEN - 1 VIEW COMPARISON:  None. FINDINGS: The bowel gas pattern is normal. Nasogastric tube tip is seen in expected position of second portion of duodenum. No radio-opaque calculi or other significant radiographic abnormality are seen. IMPRESSION: Nasogastric tube tip seen in expected position of second portion of duodenum. No evidence of bowel obstruction or ileus is noted. Electronically Signed   By:  Lupita RaiderJames  Green Jr, M.D.   On: 10/21/2016 15:42    Cultures: Sputum 12/13 >>  Antibiotics:  Cefepime 12/13 >>   Events: 12/07 Admit, Watchman procedure, cardiac tamponade, emergent sternotomy 12/13 Hypoxia, VDRF, hypotension after intubation, melanotic stool  Lines/tubes: Rt chest tube 12/07 >> 12/10 Femoral CVL 12/07 >> 12/09 ETT 12/07 >> 12/08 Rt PICC 12/09 >>  ETT 12/13 >>   Assessment/plan:  Acute hypoxic respiratory failure from acute pulmonary edema +/- HCAP. - full vent support - f/u CXR, ABG  Possible HCAP. - check procalcitonin, lactic acid - Day 1 cefepime - f/u sputum cx from 12/13  Cardiac tamponade s/p sternotomy. Permanent A fib. Hypotension after intubation. Hx of HTN, HLD. - hold lasix, lopressor for SBP < 90 - add levophed as needed to keep MAP > 65  Melanotic stool noted 12/13 with hx of GIB. - change to protonix gtt - hold tube feeds - might need GI assessment  DM type II. - SSI  Acute metabolic encephalopathy. - RASS goal 0 to -1  DVT prophylaxis - SCDs SUP - protonix gtt Nutrition - NPO  Goals of care - full code  CC time independent of procedure time 48 minutes.  [name], MD [facility] Pulmonary/Critical Care 10/23/2016, 11:58 AM Pager:  [phone] After 3pm call: [phone]

## 2016-10-23 NOTE — Procedures (Signed)
Intubation Procedure Note Julie AmmonsJoanne S [name] [name] [birth year]  Procedure: Intubation Indications: Respiratory insufficiency  Procedure Details Consent: Risks of procedure as well as the alternatives and risks of each were explained to the (patient/caregiver).  Consent for procedure obtained. Time Out: Verified patient identification, verified procedure, site/side was marked, verified correct patient position, special equipment/implants available, medications/allergies/relevent history reviewed, required imaging and test results available.  Performed  Maximum sterile technique was used including gloves and hand hygiene.  MAC and 3  Given 4 mg versed, 100 mcg fentanyl, 10 mg etomidate.  Inserted 7.5 ETT to 24 at lip.  Used glidescope.  Confirmed with CO2 detector and ausculation.  Evaluation Hemodynamic Status: dropped after intubation >> started levophed; O2 sats: stable throughout Patient's Current Condition: stable Complications: No apparent complications Patient did tolerate procedure well. Chest X-ray ordered to verify placement.  CXR: pending.  Coralyn HellingVineet Julie Zea, MD [facility] 10/23/2016, 11:28 AM Pager:  [phone] After 3pm call: [phone]

## 2016-10-23 NOTE — Progress Notes (Signed)
CT surgery p.m. Rounds  Patient resting comfortably after being intubated for poor oxygenation Currently on IV sedation protocol with low-dose norepinephrine for adequate blood pressure Breath sounds are slightly coarse bilaterally Received 40 mg Lasix IV this afternoon

## 2016-10-23 NOTE — Progress Notes (Signed)
Physical Therapy Treatment Patient Details Name: Julie AmmonsJoanne S [name] MRN: [id number] DOB: 03-05-1935 Today's Date: 10/23/2016    History of Present Illness Patient is a 81 yo female admitted 10/22/2016 following perforation of Lt atrium during procedure.  Patient now s/p emergent sternotomy for repair Lt atrium and clipping of atrial appendage.   Patient extubated 10/18/16.      PMH:  Chronic Afib, DM, obesity, HLD, HTN, RBBB, GIB, anemia    PT Comments    Pt with very poor activity tolerance and unable to attempt any standing today. Remains extremely weak. Expect progress to be slow.  Follow Up Recommendations  SNF;Supervision/Assistance - 24 hour     Equipment Recommendations  Other (comment) (To be assessed at next venue)    Recommendations for Other Services       Precautions / Restrictions Precautions Precautions: Fall;Sternal Restrictions Weight Bearing Restrictions: No Other Position/Activity Restrictions: limited push/pull with UEs    Mobility  Bed Mobility               General bed mobility comments: pt up in chair upon PT arrival  Transfers Overall transfer level: Needs assistance Equipment used: Ambulation equipment used             General transfer comment: Attempted to use Stedy to assist pt with standing. In chair pt dyspneic with RR in 26-35 on 6L of O2. Pt requiring +2 total assist to bring shoulders forward  from back of recliner. Unable to get pt's trunk forward enough to attempt standing. Used Maxisky to transfer back to bed  Ambulation/Gait                 Information systems managertairs            Wheelchair Mobility    Modified Rankin (Stroke Patients Only)       Balance Overall balance assessment: Needs assistance Sitting-balance support: Bilateral upper extremity supported Sitting balance-Leahy Scale: Zero Sitting balance - Comments: Unable to maintain trunk forward from back of recliner without +2 total assist Postural control: Posterior lean                          Cognition Arousal/Alertness: Awake/alert Behavior During Therapy: Flat affect Overall Cognitive Status: Impaired/Different from baseline   Orientation Level: Disoriented to;Time;Situation Current Attention Level: Sustained Memory: Decreased short-term memory;Decreased recall of precautions Following Commands: Follows one step commands inconsistently;Follows one step commands with increased time     Problem Solving: Slow processing;Decreased initiation;Difficulty sequencing;Requires verbal cues;Requires tactile cues      Exercises General Exercises - Upper Extremity Shoulder Flexion: AAROM;Both;10 reps;Supine (To 90 degrees) General Exercises - Lower Extremity Heel Slides: AAROM;Both;10 reps;Supine    General Comments        Pertinent Vitals/Pain Pain Assessment: Faces Faces Pain Scale: Hurts little more Pain Location: chest/incision area with coughing Pain Descriptors / Indicators: Grimacing;Guarding Pain Intervention(s): Limited activity within patient's tolerance;Monitored during session    Home Living                      Prior Function            PT Goals (current goals can now be found in the care plan section) Progress towards PT goals: Not progressing toward goals - comment    Frequency    Min 3X/week      PT Plan Current plan remains appropriate    Co-evaluation  End of Session Equipment Utilized During Treatment: Oxygen Activity Tolerance: Patient limited by fatigue Patient left: with call bell/phone within reach;in bed;with nursing/sitter in room     Time: [id number] PT Time Calculation (min) (ACUTE ONLY): 22 min  Charges:  $Therapeutic Activity: 8-22 mins                    G CodesAngelina [name]:      [name] [facility] 10/23/2016, 11:30 AM [name] PT [phone]

## 2016-10-23 NOTE — NC FL2 (Signed)
[place] LEVEL OF CARE SCREENING TOOL     IDENTIFICATION  Patient Name: [name] Birthdate: 1935-10-29 Sex: female Admission Date (Current Location): 10/19/2016  [facility] [place] and [place] Number:  Herbalist and Address:  [facility] [place], [place] [address] [place], [place] [address]      Provider Number: [id number]  Attending Physician Name and Address:  Gaye Pollack, MD  Relative Name and Phone Number:  Avis Epley, [phone]    Current Level of Care: Hospital Recommended Level of Care: [place] Prior Approval Number:    Date Approved/Denied:   PASRR Number:    Discharge Plan: SNF    Current Diagnoses: Patient Active Problem List   Diagnosis Date Noted  . Perforation of cardiac device 11/10/2016  . Permanent atrial fibrillation ([place])   . GI bleed 03/12/2015  . Gait instability 10/26/2014  . Chronic diastolic CHF (congestive heart failure) ([place]) 03/18/2014  . GIB (gastrointestinal bleeding) 03/01/2014  . Diverticulosis 03/01/2014  . Systolic murmur 54/62/7035  . PULMONARY HYPERTENSION, SECONDARY 01/16/2011  . ALLERGIC RHINITIS 01/16/2011  . Diabetes type 2, controlled ([place]) 01/15/2011  . Hyperlipidemia 01/15/2011  . Obesity 01/15/2011  . Essential hypertension 01/15/2011  . Atrial fibrillation ([place]) 01/15/2011  . VIRAL PNEUMONIA 01/15/2011  . CELLULITIS, LEG, RIGHT 01/15/2011  . PLANTAR FASCIITIS 01/15/2011  . COLONIC POLYPS, HX OF 01/15/2011    Orientation RESPIRATION BLADDER Height & Weight     Self, Situation  Normal Continent Weight: 223 lb 15.8 oz (101.6 kg) Height:  5' 4"  (162.6 cm)  BEHAVIORAL SYMPTOMS/MOOD NEUROLOGICAL BOWEL NUTRITION STATUS      Incontinent Diet (NPO time specified)  AMBULATORY STATUS COMMUNICATION OF NEEDS Skin   Extensive Assist Verbally Normal                       Personal Care Assistance Level of Assistance  Bathing, Feeding, Dressing Bathing Assistance:  Limited assistance Feeding assistance: Limited assistance Dressing Assistance: Limited assistance     Functional Limitations Info  Sight, Hearing, Speech Sight Info: Impaired Hearing Info: Impaired Speech Info: Impaired    SPECIAL CARE FACTORS FREQUENCY  PT (By licensed PT), OT (By licensed OT)     PT Frequency: 5x week OT Frequency: 5x week            Contractures Contractures Info: Not present    Additional Factors Info  Code Status, Allergies Code Status Info: Full Code Allergies Info: DILTIAZEM           Current Medications (10/23/2016):  This is the current hospital active medication list Current Facility-Administered Medications  Medication Dose Route Frequency Provider Last Rate Last Dose  . 0.45 % sodium chloride infusion   Intravenous Continuous PRN Gaye Pollack, MD   Stopped at 10/18/16 1400  . 0.9 %  sodium chloride infusion  250 mL Intravenous Continuous Gaye Pollack, MD   Stopped at 10/18/16 0701  . 0.9 %  sodium chloride infusion   Intravenous Continuous Gaye Pollack, MD   Stopped at 10/18/16 0700  . acetaminophen (TYLENOL) solution 650 mg  650 mg Per Tube Q6H PRN Rexene Alberts, MD   650 mg at 10/22/16 1956  . amiodarone (NEXTERONE PREMIX) 360-4.14 MG/200ML-% (1.8 mg/mL) IV infusion  30 mg/hr Intravenous Continuous Ivin Poot, MD 16.7 mL/hr at 10/23/16 0500 30 mg/hr at 10/23/16 0500  . budesonide (PULMICORT) nebulizer solution 0.25 mg  0.25 mg Nebulization BID Collier Salina  Prescott Gum, MD   0.25 mg at 10/22/16 2017  . chlorhexidine gluconate (MEDLINE KIT) (PERIDEX) 0.12 % solution 15 mL  15 mL Mouth Rinse BID Gaye Pollack, MD   15 mL at 10/22/16 1942  . famotidine (PEPCID) IVPB 20 mg premix  20 mg Intravenous Q12H Gaye Pollack, MD   20 mg at 10/22/16 2215  . feeding supplement (VITAL AF 1.2 CAL) liquid 1,000 mL  1,000 mL Per Tube Continuous Gaye Pollack, MD 65 mL/hr at 10/23/16 0500 1,000 mL at 10/23/16 0500  . furosemide (LASIX) injection 40 mg   40 mg Intravenous BID Gaye Pollack, MD   40 mg at 10/22/16 1707  . hydrALAZINE (APRESOLINE) injection 10 mg  10 mg Intravenous Q4H PRN Gaye Pollack, MD   10 mg at 10/22/16 1844  . insulin aspart (novoLOG) injection 0-24 Units  0-24 Units Subcutaneous Q4H Gaye Pollack, MD   2 Units at 10/23/16 0435  . lactated ringers infusion   Intravenous Continuous Gaye Pollack, MD 10 mL/hr at 10/23/16 0500    . lactated ringers infusion   Intravenous Continuous Gaye Pollack, MD   Stopped at 10/20/16 0500  . MEDLINE mouth rinse  15 mL Mouth Rinse QID Gaye Pollack, MD   15 mL at 10/23/16 0400  . metoprolol (LOPRESSOR) injection 2.5-5 mg  2.5-5 mg Intravenous Q2H PRN Gaye Pollack, MD   2.5 mg at 10/19/16 0000  . metoprolol tartrate (LOPRESSOR) 25 mg/10 mL oral suspension 25 mg  25 mg Per Tube BID Gaye Pollack, MD   25 mg at 10/22/16 2215  . morphine 2 MG/ML injection 2-5 mg  2-5 mg Intravenous Q1H PRN Gaye Pollack, MD   2 mg at 10/22/16 1315  . ondansetron (ZOFRAN) injection 4 mg  4 mg Intravenous Q6H PRN Gaye Pollack, MD      . potassium chloride 20 MEQ/15ML (10%) solution 40 mEq  40 mEq Per Tube BID Gaye Pollack, MD   40 mEq at 10/22/16 2215  . sodium chloride flush (NS) 0.9 % injection 10-40 mL  10-40 mL Intracatheter Q12H Gaye Pollack, MD   10 mL at 10/22/16 2215  . sodium chloride flush (NS) 0.9 % injection 10-40 mL  10-40 mL Intracatheter PRN Gaye Pollack, MD      . sodium chloride flush (NS) 0.9 % injection 3 mL  3 mL Intravenous Q12H Gaye Pollack, MD   3 mL at 10/22/16 2216  . sodium chloride flush (NS) 0.9 % injection 3 mL  3 mL Intravenous PRN Gaye Pollack, MD         Discharge Medications: Please see discharge summary for a list of discharge medications.  Relevant Imaging Results:  Relevant Lab Results:   Additional Information [id number]  Wende Neighbors, LCSW

## 2016-10-24 ENCOUNTER — Inpatient Hospital Stay (HOSPITAL_COMMUNITY): Payer: Medicare Other

## 2016-10-24 LAB — GLUCOSE, CAPILLARY
GLUCOSE-CAPILLARY: 85 mg/dL (ref 65–99)
GLUCOSE-CAPILLARY: 143 mg/dL — AB (ref 65–99)
Glucose-Capillary: 109 mg/dL — ABNORMAL HIGH (ref 65–99)
Glucose-Capillary: 130 mg/dL — ABNORMAL HIGH (ref 65–99)
Glucose-Capillary: 138 mg/dL — ABNORMAL HIGH (ref 65–99)
Glucose-Capillary: 83 mg/dL (ref 65–99)

## 2016-10-24 LAB — BASIC METABOLIC PANEL
ANION GAP: 7 (ref 5–15)
BUN: 33 mg/dL — AB (ref 6–20)
CALCIUM: 7.7 mg/dL — AB (ref 8.9–10.3)
CO2: 31 mmol/L (ref 22–32)
Chloride: 109 mmol/L (ref 101–111)
Creatinine, Ser: 1 mg/dL (ref 0.44–1.00)
GFR calc Af Amer: >60 mL/min (ref >60)
GFR calc non Af Amer: 52 mL/min — ABNORMAL LOW (ref >60)
GLUCOSE: 100 mg/dL — AB (ref 65–99)
POTASSIUM: 3.7 mmol/L (ref 3.5–5.1)
Sodium: 147 mmol/L — ABNORMAL HIGH (ref 135–145)

## 2016-10-24 LAB — CBC
HEMATOCRIT: 31.3 % — AB (ref 36.0–46.0)
HEMOGLOBIN: 10 g/dL — AB (ref 12.0–15.0)
MCH: 29.9 pg (ref 26.0–34.0)
MCHC: 31.9 g/dL (ref 30.0–36.0)
MCV: 93.7 fL (ref 78.0–100.0)
Platelets: 270 10*3/uL (ref 150–400)
RBC: 3.34 MIL/uL — ABNORMAL LOW (ref 3.87–5.11)
RDW: 20.1 % — AB (ref 11.5–15.5)
WBC: 14.1 10*3/uL — ABNORMAL HIGH (ref 4.0–10.5)

## 2016-10-24 LAB — PROCALCITONIN: Procalcitonin: 0.32 ng/mL

## 2016-10-24 MED ORDER — CHLORHEXIDINE GLUCONATE 0.12 % MT SOLN
OROMUCOSAL | Status: AC
  Filled 2016-10-24: qty 15

## 2016-10-24 MED ORDER — POTASSIUM CHLORIDE 20 MEQ/15ML (10%) PO SOLN
20.0000 meq | Freq: Two times a day (BID) | ORAL | Status: DC
  Administered 2016-10-24 – 2016-10-28 (×9): 20 meq
  Filled 2016-10-24 (×9): qty 15

## 2016-10-24 MED ORDER — SODIUM CHLORIDE 0.9 % IV SOLN
30.0000 meq | Freq: Once | INTRAVENOUS | Status: AC
  Administered 2016-10-24: 30 meq via INTRAVENOUS
  Filled 2016-10-24: qty 15

## 2016-10-24 MED ORDER — VITAL AF 1.2 CAL PO LIQD
1000.0000 mL | ORAL | Status: DC
  Administered 2016-10-24 – 2016-10-25 (×2): 1000 mL

## 2016-10-24 MED ORDER — FUROSEMIDE 10 MG/ML IJ SOLN
40.0000 mg | Freq: Two times a day (BID) | INTRAMUSCULAR | Status: AC
Start: 2016-10-24 — End: 2016-10-25
  Administered 2016-10-24 – 2016-10-25 (×2): 40 mg via INTRAVENOUS
  Filled 2016-10-24 (×2): qty 4

## 2016-10-24 MED ORDER — FUROSEMIDE 10 MG/ML IJ SOLN
40.0000 mg | INTRAMUSCULAR | Status: AC
Start: 2016-10-24 — End: 2016-10-24
  Administered 2016-10-24: 40 mg via INTRAVENOUS
  Filled 2016-10-24: qty 4

## 2016-10-24 NOTE — Progress Notes (Signed)
7 Days Post-Op Procedure(s) (LRB): STERNOTOMY - Repair of Left Atrium (N/A) CLIPPING OF ATRIAL APPENDAGE (N/A) Subjective:  Awake on vent on fentanyl drip 75 mcg.   On levophed yesterday afternoon but off now.  Objective: Vital signs in last 24 hours: Temp:  [98.5 F (36.9 C)-99.3 F (37.4 C)] 99 F (37.2 C) (12/14 0400) Pulse Rate:  [40-101] 84 (12/14 0700) Cardiac Rhythm: Atrial fibrillation (12/14 0400) Resp:  [10-27] 18 (12/14 0700) BP: (64-149)/(39-98) 117/50 (12/14 0700) SpO2:  [97 %-100 %] 99 % (12/14 0700) FiO2 (%):  [50 %-100 %] 50 % (12/14 0400) Weight:  [101 kg (222 lb 10.6 oz)] 101 kg (222 lb 10.6 oz) (12/14 0500)  Hemodynamic parameters for last 24 hours:    Intake/Output from previous day: 12/13 0701 - 12/14 0700 In: 1978.1 [I.V.:848.1; NG/GT:615; IV Piggyback:465] Out: 3150 [Urine:3150] Intake/Output this shift: No intake/output data recorded.  General appearance: fatigued and slowed mentation Neurologic: intact Heart: regular rate and rhythm Lungs: rhonchi bilaterally Abdomen: soft, non-tender; bowel sounds normal; no masses,  no organomegaly Extremities: edema mild anasarca improving Wound: chest incision ok  Lab Results:  Recent Labs  10/23/16 1258 10/24/16 0500  WBC 16.6* 14.1*  HGB 10.2* 10.0*  HCT 31.1* 31.3*  PLT 269 270   BMET:  Recent Labs  10/23/16 0430 10/24/16 0500  NA 146* 147*  K 4.3 3.7  CL 110 109  CO2 28 31  GLUCOSE 144* 100*  BUN 30* 33*  CREATININE 0.92 1.00  CALCIUM 8.1* 7.7*    PT/INR: No results for input(s): LABPROT, INR in the last 72 hours. ABG    Component Value Date/Time   PHART 7.428 10/23/2016 1315   HCO3 31.9 (H) 10/23/2016 1315   TCO2 33 10/23/2016 1315   ACIDBASEDEF 4.0 (H) 10/18/2016 1355   O2SAT 100.0 10/23/2016 1315   CBG (last 3)   Recent Labs  10/23/16 2342 10/24/16 0342 10/24/16 0711  GLUCAP 90 85 83   CLINICAL DATA:  Pneumonia.  EXAM: PORTABLE CHEST 1 VIEW  COMPARISON:   Radiograph of October 23, 2016.  FINDINGS: Endotracheal and feeding tube are unchanged in position. Stable right-sided PICC line with distal tip in expected position of cavoatrial junction. No pneumothorax is noted. Stable bibasilar opacities are noted concerning for edema or pneumonia with associated pleural effusions. Bony thorax is unremarkable.  IMPRESSION: Stable support apparatus. Stable bibasilar opacities as described above.   Electronically Signed   By: Lupita RaiderJames  Green Jr, M.D.   On: 10/24/2016 07:47  Assessment/Plan: S/P Procedure(s) (LRB): STERNOTOMY - Repair of Left Atrium (N/A) CLIPPING OF ATRIAL APPENDAGE (N/A)  CV: hemodynamics stable. Levophed briefly yesterday after intubation and sedation but off now.  Resp: VDRF secondary to deconditioning, possible pneumonia, atelectasis and probably some edema. CXR looks stable. CCM is following. I think it will be tough getting her back off the vent due to her age and preop condition.  ID: afebrile, mild leukocytosis. Sputum gram stain unremarkable, culture pending. Continue antibiotic pending cultures. Nurse reports not sucking much out of lungs.  Renal: stable. She had total body volume excess due to peripheral edema but probably intravascularly euvolemic. Continue slow diuresis.  Neuro: stable  GI: hx of GIB on anticoagulation. Will not use any. Her LAA is clipped. SCD's for DVT prophylaxis. Resume tube feeds.  I think her prognosis for successful extubation and progression is guarded.   LOS: 7 days    Alleen BorneBryan K Bartle 10/24/2016

## 2016-10-24 NOTE — Care Management Important Message (Signed)
Important Message  Patient Details  Name: [name] MRN: [id number] Date of Birth: Mar 25, 1935   Medicare Important Message Given:  Yes    Kyla BalzarineShealy, Calandra Madura Abena 10/24/2016, 9:50 AM

## 2016-10-24 NOTE — Progress Notes (Signed)
PCCM CONSULT NOTE  Admission date: 11/10/2016 Consult date: 10/23/2016 Referring provider: Dr. Laneta SimmersBartle  CC: Short of breath  HPI: 81 yo female presented 11/01/2016 for implantation of Lt atrial appendage occluder.  She has permanent A fib on eliquis.  She developed hypotension post procedure from pericardial effusion and tamponade physiology.  She had emergent sternotomy with clipping of atrial appendage.  She improved after surgery and was extubated.  She developed worsening hypoxia with pulmonary infiltrates on 12/13 and required intubation.  She was started on Abx, and continued on diuresis.  She was hypotensive post-procedure and noted to have maroon colored stool.  She has hx of GI bleeding.  Vital signs: BP (!) 117/50   Pulse 84   Temp 99 F (37.2 C) (Core (Comment))   Resp 18   Ht 5\' 4"  (1.626 m)   Wt 222 lb 10.6 oz (101 kg)   SpO2 99%   BMI 38.22 kg/m   Intake/output: I/O last 3 completed shifts: In: 2945.1 [I.V.:1115.1; Other:50; NG/GT:1265; IV Piggyback:515] Out: 4190 [Urine:4190]  General: ill appearing Neuro: alert, confused, moves extremities, nods on vent HEENT: pupils reactive, NG tube in place, ET in place Cardiac: sternal wound clean, irregular Chest: b/l crackles and wheezes Abd: soft, non tender Ext: 1+ edema Skin: multiple areas of ecchymosis   CMP Latest Ref Rng & Units 10/24/2016 10/23/2016 10/22/2016  Glucose 65 - 99 mg/dL 960(A100(H) 540(J144(H) 811(B177(H)  BUN 6 - 20 mg/dL 14(N33(H) 82(N30(H) 56(O28(H)  Creatinine 0.44 - 1.00 mg/dL 1.301.00 8.650.92 7.840.90  Sodium 135 - 145 mmol/L 147(H) 146(H) 145  Potassium 3.5 - 5.1 mmol/L 3.7 4.3 4.1  Chloride 101 - 111 mmol/L 109 110 106  CO2 22 - 32 mmol/L 31 28 -  Calcium 8.9 - 10.3 mg/dL 7.7(L) 8.1(L) -  Total Protein 6.5 - 8.1 g/dL - - -  Total Bilirubin 0.3 - 1.2 mg/dL - - -  Alkaline Phos 38 - 126 U/L - - -  AST 15 - 41 U/L - - -  ALT 14 - 54 U/L - - -    CBC Latest Ref Rng & Units 10/24/2016 10/23/2016 10/23/2016  WBC 4.0 - 10.5  K/uL 14.1(H) 16.6(H) 16.3(H)  Hemoglobin 12.0 - 15.0 g/dL 10.0(L) 10.2(L) 10.7(L)  Hematocrit 36.0 - 46.0 % 31.3(L) 31.1(L) 32.4(L)  Platelets 150 - 400 K/uL 270 269 277    ABG    Component Value Date/Time   PHART 7.428 10/23/2016 1315   PCO2ART 48.2 (H) 10/23/2016 1315   PO2ART 361.0 (H) 10/23/2016 1315   HCO3 31.9 (H) 10/23/2016 1315   TCO2 33 10/23/2016 1315   ACIDBASEDEF 4.0 (H) 10/18/2016 1355   O2SAT 100.0 10/23/2016 1315    CBG (last 3)   Recent Labs  10/23/16 2342 10/24/16 0342 10/24/16 0711  GLUCAP 90 85 83     Imaging: Dg Chest Port 1 View  Result Date: 10/24/2016 CLINICAL DATA:  Pneumonia. EXAM: PORTABLE CHEST 1 VIEW COMPARISON:  Radiograph of October 23, 2016. FINDINGS: Endotracheal and feeding tube are unchanged in position. Stable right-sided PICC line with distal tip in expected position of cavoatrial junction. No pneumothorax is noted. Stable bibasilar opacities are noted concerning for edema or pneumonia with associated pleural effusions. Bony thorax is unremarkable. IMPRESSION: Stable support apparatus. Stable bibasilar opacities as described above. Electronically Signed   By: Lupita RaiderJames  Green Jr, M.D.   On: 10/24/2016 07:47   Dg Chest Port 1 View  Result Date: 10/23/2016 CLINICAL DATA:  Check endotracheal tube EXAM: PORTABLE CHEST  1 VIEW COMPARISON:  10/23/2016 FINDINGS: Cardiac shadow is again enlarged. Postsurgical changes are again seen. Right-sided PICC line is stable in appearance. Endotracheal tube has been placed and lie is just above the carina. This could be withdrawn 1-2 cm. Feeding catheter is seen and stable. Bilateral infiltrative changes are again identified likely related to pulmonary edema. No bony abnormality is seen. IMPRESSION: Status post intubation with the catheter tip approximately 8 mm above the carina. This could be withdrawn 1-2 cm. Bilateral patchy infiltrates.  These are stable from the prior exam. Electronically Signed   By: Alcide CleverMark   Lukens M.D.   On: 10/23/2016 12:39   Dg Chest Port 1 View  Result Date: 10/23/2016 CLINICAL DATA:  Shortness of breath. EXAM: PORTABLE CHEST 1 VIEW COMPARISON:  10/21/2016. FINDINGS: Feeding tube noted with its tip below the left hemidiaphragm. Right PICC line stable position. Prior CABG. Left atrial appendage clip. Cardiomegaly with diffuse bilateral pulmonary infiltrates/edema and pleural effusions. Findings have progressed from prior exam . No pneumothorax. IMPRESSION: 1. Feeding tube noted with its tip below left hemidiaphragm. Right PICC line stable position. 2. Prior CABG. Cardiomegaly. Diffuse bilateral pulmonary alveolar infiltrates/edema with bilateral pleural effusions noted. Findings have progressed from prior exam. Electronically Signed   By: Maisie Fushomas  Register   On: 10/23/2016 07:58    Cultures: Sputum 12/13 >>  Antibiotics:  Cefepime 12/13 >>   Events: 12/07 Admit, Watchman procedure, cardiac tamponade, emergent sternotomy 12/13 Hypoxia, VDRF, hypotension after intubation, melanotic stool  Lines/tubes: Rt chest tube 12/07 >> 12/10 Femoral CVL 12/07 >> 12/09 ETT 12/07 >> 12/08 Rt PICC 12/09 >>  ETT 12/13 >>   Assessment/plan:  Acute hypoxic respiratory failure from acute pulmonary edema +/- HCAP. - full vent support - f/u CXR, ABG  -I suspect she will need trach due to poor functional status   Possible HCAP. - check procalcitonin, lactic acid - Day 2 cefepime - f/u sputum cx from 12/13  Cardiac tamponade s/p sternotomy. Permanent A fib. Hypotension after intubation. Hx of HTN, HLD. - hold lasix, lopressor for SBP < 90 - add levophed as needed to keep MAP > 65  Melanotic stool noted 12/13 with hx of GIB. +hematuria  Recent Labs  10/23/16 1258 10/24/16 0500  HGB 10.2* 10.0*    - change to protonix q12h - hold tube feeds - might need GI assessment, streaks of bloody stool hgb stable - check coags  DM type II. CBG (last 3)   Recent Labs   10/23/16 2342 10/24/16 0342 10/24/16 0711  GLUCAP 90 85 83     - SSI  Acute metabolic encephalopathy. - RASS goal 0 to -1  DVT prophylaxis - SCDs SUP - protonix  Nutrition - NPO Goals of care - full code  CC time=30 min  Brett CanalesSteve Minor ACNP [facility] 10/24/2016, 9:11 AM   More alert.  Denies abd pain.  Ill appearing.  HR regular.  B/l crackles.  Abd soft.  Chest wall ecchymosis.  CXR - b/l ASD  WBC 14.1, Hb 10, Creatine 1, Procalcitonin 0.32  Assessment/plan:  Acute respiratory failure from pulmonary edema and HCAP. - full vent support - lasix - continue Abx  ?GI bleed 12/13 >> improved. Hematuria. - f/u CBC - continue protonix q12h - monitor urine output  A fib. Cardiac tamponade s/p sternotomy. - per TCTS and cardiology  CC time 34 minutes independent of APP time  Coralyn HellingVineet Henri Guedes, MD [facility] 10/24/2016, 10:38 AM Pager:  [phone] After 3pm call: [phone]

## 2016-10-24 NOTE — Progress Notes (Signed)
      [address]       [place] [id number]             [phone]      POD # 7 repair left atrial appendage  BP (!) 151/49 (BP Location: Left Arm)   Pulse (!) 34   Temp 98.4 F (36.9 C) (Core (Comment)) Comment (Src): foley  Resp 18   Ht 5\' 4"  (1.626 m)   Wt 222 lb 10.6 oz (101 kg)   SpO2 97%   BMI 38.22 kg/m    Intake/Output Summary (Last 24 hours) at 10/24/16 1714 Last data filed at 10/24/16 1600  Gross per 24 hour  Intake          2175.91 ml  Output             3360 ml  Net         -1184.09 ml   Remains on vent  Continue current care  Ashe Gago C. Dorris FetchHendrickson, MD [facility] [phone]

## 2016-10-25 ENCOUNTER — Inpatient Hospital Stay (HOSPITAL_COMMUNITY): Payer: Medicare Other

## 2016-10-25 LAB — GLUCOSE, CAPILLARY
GLUCOSE-CAPILLARY: 114 mg/dL — AB (ref 65–99)
GLUCOSE-CAPILLARY: 226 mg/dL — AB (ref 65–99)
GLUCOSE-CAPILLARY: 90 mg/dL (ref 65–99)
Glucose-Capillary: 142 mg/dL — ABNORMAL HIGH (ref 65–99)
Glucose-Capillary: 130 mg/dL — ABNORMAL HIGH (ref 65–99)
Glucose-Capillary: 158 mg/dL — ABNORMAL HIGH (ref 65–99)

## 2016-10-25 LAB — BASIC METABOLIC PANEL WITH GFR
Anion gap: 6 (ref 5–15)
BUN: 37 mg/dL — ABNORMAL HIGH (ref 6–20)
CO2: 33 mmol/L — ABNORMAL HIGH (ref 22–32)
Calcium: 7.6 mg/dL — ABNORMAL LOW (ref 8.9–10.3)
Chloride: 109 mmol/L (ref 101–111)
Creatinine, Ser: 0.96 mg/dL (ref 0.44–1.00)
GFR calc Af Amer: >60 mL/min
GFR calc non Af Amer: 55 mL/min — ABNORMAL LOW
Glucose, Bld: 147 mg/dL — ABNORMAL HIGH (ref 65–99)
Potassium: 3.4 mmol/L — ABNORMAL LOW (ref 3.5–5.1)
Sodium: 148 mmol/L — ABNORMAL HIGH (ref 135–145)

## 2016-10-25 LAB — CBC
HCT: 30.3 % — ABNORMAL LOW (ref 36.0–46.0)
Hemoglobin: 9.6 g/dL — ABNORMAL LOW (ref 12.0–15.0)
MCH: 30.2 pg (ref 26.0–34.0)
MCHC: 31.7 g/dL (ref 30.0–36.0)
MCV: 95.3 fL (ref 78.0–100.0)
PLATELETS: 285 10*3/uL (ref 150–400)
RBC: 3.18 MIL/uL — ABNORMAL LOW (ref 3.87–5.11)
RDW: 20.3 % — ABNORMAL HIGH (ref 11.5–15.5)
WBC: 13.4 10*3/uL — ABNORMAL HIGH (ref 4.0–10.5)

## 2016-10-25 LAB — PROTIME-INR
INR: 1.61
Prothrombin Time: 19.3 s — ABNORMAL HIGH (ref 11.4–15.2)

## 2016-10-25 LAB — POCT I-STAT 3, ART BLOOD GAS (G3+)
ACID-BASE EXCESS: 10 mmol/L — AB (ref 0.0–2.0)
BICARBONATE: 35.2 mmol/L — AB (ref 20.0–28.0)
O2 Saturation: 99 %
TCO2: 37 mmol/L (ref 0–100)
pCO2 arterial: 48.6 mmHg — ABNORMAL HIGH (ref 32.0–48.0)
pH, Arterial: 7.467 — ABNORMAL HIGH (ref 7.350–7.450)
pO2, Arterial: 151 mmHg — ABNORMAL HIGH (ref 83.0–108.0)

## 2016-10-25 LAB — MAGNESIUM: Magnesium: 1.7 mg/dL (ref 1.7–2.4)

## 2016-10-25 LAB — APTT: APTT: 43 s — AB (ref 24–36)

## 2016-10-25 LAB — PROCALCITONIN: Procalcitonin: 0.28 ng/mL

## 2016-10-25 LAB — PHOSPHORUS: Phosphorus: <1 mg/dL — CL (ref 2.5–4.6)

## 2016-10-25 MED ORDER — VITAL AF 1.2 CAL PO LIQD
1000.0000 mL | ORAL | Status: DC
Start: 2016-10-25 — End: 2016-10-28
  Administered 2016-10-27 – 2016-10-28 (×2): 1000 mL

## 2016-10-25 MED ORDER — MAGNESIUM SULFATE 2 GM/50ML IV SOLN
2.0000 g | Freq: Once | INTRAVENOUS | Status: AC
  Administered 2016-10-25: 2 g via INTRAVENOUS
  Filled 2016-10-25: qty 50

## 2016-10-25 MED ORDER — POTASSIUM PHOSPHATES 15 MMOLE/5ML IV SOLN
30.0000 mmol | Freq: Once | INTRAVENOUS | Status: AC
  Administered 2016-10-25: 30 mmol via INTRAVENOUS
  Filled 2016-10-25: qty 10

## 2016-10-25 MED ORDER — PRO-STAT SUGAR FREE PO LIQD
30.0000 mL | Freq: Three times a day (TID) | ORAL | Status: DC
  Administered 2016-10-25 – 2016-10-28 (×9): 30 mL
  Filled 2016-10-25 (×11): qty 30

## 2016-10-25 NOTE — Progress Notes (Signed)
PCCM CONSULT NOTE  Admission date: 11/10/2016 Consult date: 10/23/2016 Referring provider: Dr. Laneta SimmersBartle  CC: Short of breath  HPI: 81 yo female presented 2016/08/21 for implantation of Lt atrial appendage occluder.  She has permanent A fib on eliquis.  She developed hypotension post procedure from pericardial effusion and tamponade physiology.  She had emergent sternotomy with clipping of atrial appendage.  She improved after surgery and was extubated.  She developed worsening hypoxia with pulmonary infiltrates on 12/13 and required intubation.  She was started on Abx, and continued on diuresis.  She was hypotensive post-procedure and noted to have maroon colored stool.  She has hx of GI bleeding.  Subjective: Agitated with WUA.  Didn't tolerate SBT.  Vital signs: BP (!) 158/63   Pulse 86   Temp 97.3 F (36.3 C) (Core (Comment))   Resp 18   Ht 5\' 4"  (1.626 m)   Wt 220 lb 14.4 oz (100.2 kg)   SpO2 100%   BMI 37.92 kg/m   Intake/output: I/O last 3 completed shifts: In: 3965.9 [I.V.:1860.1; Other:70; NG/GT:1570.8; IV Piggyback:465] Out: 4485 [Urine:4235; Stool:250]  General: ill appearing Neuro: follows simple commands HEENT: ETT in place Cardiac: sternal wound clean, irregular Chest: b/l crackles and wheezes Abd: soft, non tender Ext: 1+ edema Skin: multiple areas of ecchymosis   CMP Latest Ref Rng & Units 10/25/2016 10/24/2016 10/23/2016  Glucose 65 - 99 mg/dL 191(Y147(H) 782(N100(H) 562(Z144(H)  BUN 6 - 20 mg/dL 30(Q37(H) 65(H33(H) 84(O30(H)  Creatinine 0.44 - 1.00 mg/dL 9.620.96 9.521.00 8.410.92  Sodium 135 - 145 mmol/L 148(H) 147(H) 146(H)  Potassium 3.5 - 5.1 mmol/L 3.4(L) 3.7 4.3  Chloride 101 - 111 mmol/L 109 109 110  CO2 22 - 32 mmol/L 33(H) 31 28  Calcium 8.9 - 10.3 mg/dL 7.6(L) 7.7(L) 8.1(L)  Total Protein 6.5 - 8.1 g/dL - - -  Total Bilirubin 0.3 - 1.2 mg/dL - - -  Alkaline Phos 38 - 126 U/L - - -  AST 15 - 41 U/L - - -  ALT 14 - 54 U/L - - -    CBC Latest Ref Rng & Units 10/25/2016 10/24/2016  10/23/2016  WBC 4.0 - 10.5 K/uL 13.4(H) 14.1(H) 16.6(H)  Hemoglobin 12.0 - 15.0 g/dL 3.2(G9.6(L) 10.0(L) 10.2(L)  Hematocrit 36.0 - 46.0 % 30.3(L) 31.3(L) 31.1(L)  Platelets 150 - 400 K/uL 285 270 269    ABG    Component Value Date/Time   PHART 7.467 (H) 10/25/2016 0403   PCO2ART 48.6 (H) 10/25/2016 0403   PO2ART 151.0 (H) 10/25/2016 0403   HCO3 35.2 (H) 10/25/2016 0403   TCO2 37 10/25/2016 0403   ACIDBASEDEF 4.0 (H) 10/18/2016 1355   O2SAT 99.0 10/25/2016 0403    CBG (last 3)   Recent Labs  10/24/16 2335 10/25/16 0350 10/25/16 0730  GLUCAP 130* 142* 130*     Imaging: Dg Chest Port 1 View  Result Date: 10/25/2016 CLINICAL DATA:  Intubated patient, respiratory failure. EXAM: PORTABLE CHEST 1 VIEW COMPARISON:  Portable chest x-ray of October 24, 2016 FINDINGS: The lungs are adequately inflated. The interstitial markings remain increased. Bilateral infiltrates are present in the mid and lower lung zones. The hemidiaphragms are obscured. The cardiac silhouette is top-normal in size. The left atrial appendage clip appears stable. The pulmonary vascularity is engorged and indistinct. There are small bilateral pleural effusions layering posteriorly. The endotracheal tube tip lies approximately 2.4 cm above the carina. The feeding tube tip projects below the inferior margin of the image. The right PICC line tip  projects over the midportion of the SVC. There is calcification in the wall of the aortic arch. IMPRESSION: Stable bibasilar atelectasis or pneumonia with smaller moderate-sized pleural effusions. Pulmonary vascular congestion and pulmonary interstitial edema, stable. The support tubes are in reasonable position. Thoracic aortic atherosclerosis. Electronically Signed   By: David  SwazilandJordan M.D.   On: 10/25/2016 07:33   Dg Chest Port 1 View  Result Date: 10/24/2016 CLINICAL DATA:  Pneumonia. EXAM: PORTABLE CHEST 1 VIEW COMPARISON:  Radiograph of October 23, 2016. FINDINGS: Endotracheal  and feeding tube are unchanged in position. Stable right-sided PICC line with distal tip in expected position of cavoatrial junction. No pneumothorax is noted. Stable bibasilar opacities are noted concerning for edema or pneumonia with associated pleural effusions. Bony thorax is unremarkable. IMPRESSION: Stable support apparatus. Stable bibasilar opacities as described above. Electronically Signed   By: Lupita RaiderJames  Green Jr, M.D.   On: 10/24/2016 07:47   Dg Chest Port 1 View  Result Date: 10/23/2016 CLINICAL DATA:  Check endotracheal tube EXAM: PORTABLE CHEST 1 VIEW COMPARISON:  10/23/2016 FINDINGS: Cardiac shadow is again enlarged. Postsurgical changes are again seen. Right-sided PICC line is stable in appearance. Endotracheal tube has been placed and lie is just above the carina. This could be withdrawn 1-2 cm. Feeding catheter is seen and stable. Bilateral infiltrative changes are again identified likely related to pulmonary edema. No bony abnormality is seen. IMPRESSION: Status post intubation with the catheter tip approximately 8 mm above the carina. This could be withdrawn 1-2 cm. Bilateral patchy infiltrates.  These are stable from the prior exam. Electronically Signed   By: Alcide CleverMark  Lukens M.D.   On: 10/23/2016 12:39    Cultures: Sputum 12/13 >>  Antibiotics:  Cefepime 12/13 >>   Events: 12/07 Admit, Watchman procedure, cardiac tamponade, emergent sternotomy 12/13 Hypoxia, VDRF, hypotension after intubation, melanotic stool 12/15 Off protonix gtt  Lines/tubes: Rt chest tube 12/07 >> 12/10 Femoral CVL 12/07 >> 12/09 ETT 12/07 >> 12/08 Rt PICC 12/09 >>  ETT 12/13 >>   Assessment/plan:  Acute hypoxic respiratory failure from acute pulmonary edema +/- HCAP. - full vent support - f/u CXR - doubt she will be able to wean off vent anytime soon, and would likely need trach  Possible HCAP. - Day 3 cefepime - f/u sputum cx from 12/13  Cardiac tamponade s/p sternotomy. Permanent A  fib. Hypotension after intubation. Hx of HTN, HLD. - per TCTS and cardiology  Hypokalemia. Hypophosphatemia. - replace electrolytes  Melanotic stool noted 12/13 with hx of GIB >> no further episodes since. - changed to protonix q12h  Anemia of critical illness. - f/u CBC  DM type II. - SSI  Acute metabolic encephalopathy. - RASS goal 0 to -1  DVT prophylaxis - SCDs SUP - protonix  Nutrition - tube feeds Goals of care - full code  Updated pt's family at bedside.  CC time 32 minutes.  Coralyn HellingVineet Chet Greenley, MD [facility] 10/25/2016, 10:56 AM Pager:  [phone] After 3pm call: [phone]

## 2016-10-25 NOTE — Progress Notes (Addendum)
Nutrition Follow Up  DOCUMENTATION CODES:   Obesity unspecified  INTERVENTION:    Decrease Vital AF 1.2 formula to goal rate of 40 ml/hr   Add Prostat liquid protein 30 ml TID via tube  Total TF regimen to provide 1452 kcals, 117 gm protein, 803 ml of free water  NUTRITION DIAGNOSIS:   Inadequate oral intake related to inability to eat as evidenced by NPO status, onoging  NEW GOAL:   Provide needs based on ASPEN/SCCM guidelines, progressing  MONITOR:   TF tolerance, Vent status, Labs, Weight trends, Skin, I & O's  ASSESSMENT:   81 yo female with PMH of Chronic Afib, DM, obesity, HLD, HTN, RBBB, GIB, and amemia; admitted for planned procedure.   Pt s/p procedure 12/7. STERNOTOMY - Repair of Left Atrium  CLIPPING OF ATRIAL APPENDAGE   Patient is currently intubated on ventilator support Temp (24hrs), Avg:97.9 F (36.6 C), Min:97.3 F (36.3 C), Max:98.4 F (36.9 C)  VDRF likely due to possible PNA, atelectasis and edema. Vital AF 1.2 formula infusing at goal rate of 65 ml/hr via CORTRAK small bore feeding tube (tip in duodenum). CWOCN note reviewed >> pt with bruise to buttock; MASD to inner groin. Labs and medications reviewed. CBG's 142-130-158.  Diet Order:  Diet NPO time specified  Skin:  Wound (see comment) (MASD to groin)  Last BM:  12/13  Height:   Ht Readings from Last 1 Encounters:  10/18/16 5\' 4"  (1.626 m)    Weight:   Wt Readings from Last 1 Encounters:  10/25/16 220 lb 14.4 oz (100.2 kg)    Ideal Body Weight:  54.5 kg  BMI:  Body mass index is 37.92 kg/m.  Re-estimated Nutritional Needs:   Kcal:  1100-1400  Protein:  110-120  gm  Fluid:  per MD  EDUCATION NEEDS:   No education needs identified at this time  Maureen ChattersKatie Chong January, RD, LDN Pager #: [phone] After-Hours Pager #: [phone]

## 2016-10-25 NOTE — Progress Notes (Signed)
Pt placed on 5/5 CP/PS for SBT. Pt tol very well. Will cont to monitor.

## 2016-10-25 NOTE — Progress Notes (Signed)
Patient ID: [name], female   DOB: 1935-09-17, 81 y.o.   MRN: [id number] EVENING ROUNDS NOTE :     [address]       [place] [id number]             [phone]                 8 Days Post-Op Procedure(s) (LRB): STERNOTOMY - Repair of Left Atrium (N/A) CLIPPING OF ATRIAL APPENDAGE (N/A)  Total Length of Stay:  LOS: 8 days  BP (!) 130/50   Pulse 92   Temp 98.1 F (36.7 C) (Other (Comment)) Comment: Simultaneous filing. User may not have seen previous data. Comment (Src): foley temp Simultaneous filing. User may not have seen previous data.  Resp 14   Ht 5\' 4"  (1.626 m)   Wt 220 lb 14.4 oz (100.2 kg)   SpO2 100%   BMI 37.92 kg/m   .Intake/Output      12/14 0701 - 12/15 0700 12/15 0701 - 12/16 0700   I.V. (mL/kg) 1250.1 (12.5) 362.5 (3.6)   Other 70 30   NG/GT 1410.8 700   IV Piggyback 150 560   Total Intake(mL/kg) 2880.9 (28.8) 1652.5 (16.5)   Urine (mL/kg/hr) 3160 (1.3) 1150 (1.1)   Stool 250 (0.1) 0 (0)   Total Output 3410 1150   Net -529.1 +502.5          . sodium chloride Stopped (10/18/16 1400)  . sodium chloride Stopped (10/18/16 0701)  . sodium chloride Stopped (10/18/16 0700)  . feeding supplement (VITAL AF 1.2 CAL) 1,000 mL (10/25/16 1700)  . fentaNYL infusion INTRAVENOUS 200 mcg/hr (10/25/16 1700)  . lactated ringers 10 mL/hr at 10/25/16 1700  . lactated ringers 10 mL/hr at 10/25/16 1700     Lab Results  Component Value Date   WBC 13.4 (H) 10/25/2016   HGB 9.6 (L) 10/25/2016   HCT 30.3 (L) 10/25/2016   PLT 285 10/25/2016   GLUCOSE 147 (H) 10/25/2016   ALT 7 (L) 03/19/2015   AST 19 03/19/2015   NA 148 (H) 10/25/2016   K 3.4 (L) 10/25/2016   CL 109 10/25/2016   CREATININE 0.96 10/25/2016   BUN 37 (H) 10/25/2016   CO2 33 (H) 10/25/2016   INR 1.61 10/25/2016   Sedated on vent   Delight OvensEdward B Sanaa Zilberman MD  Beeper [phone] Office [phone] 10/25/2016 5:23 PM

## 2016-10-25 NOTE — Progress Notes (Signed)
CRITICAL VALUE ALERT  Critical value received:  Phos < 1  Date of notification:  10/25/2016  Time of notification:  0600  Critical value read back:Yes.    Nurse who received alert:  Burna CashEric Aneesah Hernan  MD notified (1st page):  E - Link  Time of first page:  0645  MD notified (2nd page):  Time of second page:  Responding MD:    Time MD responded:

## 2016-10-25 NOTE — Progress Notes (Signed)
8 Days Post-Op Procedure(s) (LRB): STERNOTOMY - Repair of Left Atrium (N/A) CLIPPING OF ATRIAL APPENDAGE (N/A) Subjective: Intubated and on Fentanyl drip but awake and nods weakly to questions.  Objective: Vital signs in last 24 hours: Temp:  [97.3 F (36.3 C)-98.4 F (36.9 C)] 97.3 F (36.3 C) (12/15 0733) Pulse Rate:  [34-113] 104 (12/15 0700) Cardiac Rhythm: Atrial fibrillation (12/15 0400) Resp:  [17-21] 18 (12/15 0700) BP: (108-168)/(45-81) 137/53 (12/15 0700) SpO2:  [97 %-100 %] 100 % (12/15 0700) FiO2 (%):  [40 %-50 %] 40 % (12/15 0400) Weight:  [100.2 kg (220 lb 14.4 oz)] 100.2 kg (220 lb 14.4 oz) (12/15 0500)  Hemodynamic parameters for last 24 hours:    Intake/Output from previous day: 12/14 0701 - 12/15 0700 In: 2880.9 [I.V.:1250.1; NG/GT:1410.8; IV Piggyback:150] Out: 3410 [Urine:3160; Stool:250] Intake/Output this shift: No intake/output data recorded.  General appearance: slowed mentation Neurologic: intact Heart: irregularly irregular rhythm Lungs: coarse BS Abdomen: soft, non-tender; bowel sounds normal; no masses,  no organomegaly Extremities: moderate anasarca stable Wound: incision healing well  Lab Results:  Recent Labs  10/24/16 0500 10/25/16 0500  WBC 14.1* 13.4*  HGB 10.0* 9.6*  HCT 31.3* 30.3*  PLT 270 285   BMET:  Recent Labs  10/24/16 0500 10/25/16 0500  NA 147* 148*  K 3.7 3.4*  CL 109 109  CO2 31 33*  GLUCOSE 100* 147*  BUN 33* 37*  CREATININE 1.00 0.96  CALCIUM 7.7* 7.6*    PT/INR:  Recent Labs  10/25/16 0500  LABPROT 19.3*  INR 1.61   ABG    Component Value Date/Time   PHART 7.467 (H) 10/25/2016 0403   HCO3 35.2 (H) 10/25/2016 0403   TCO2 37 10/25/2016 0403   ACIDBASEDEF 4.0 (H) 10/18/2016 1355   O2SAT 99.0 10/25/2016 0403   CBG (last 3)   Recent Labs  10/24/16 2335 10/25/16 0350 10/25/16 0730  GLUCAP 130* 142* 130*   CLINICAL DATA:  Intubated patient, respiratory failure.  EXAM: PORTABLE CHEST 1  VIEW  COMPARISON:  Portable chest x-ray of October 24, 2016  FINDINGS: The lungs are adequately inflated. The interstitial markings remain increased. Bilateral infiltrates are present in the mid and lower lung zones. The hemidiaphragms are obscured. The cardiac silhouette is top-normal in size. The left atrial appendage clip appears stable. The pulmonary vascularity is engorged and indistinct. There are small bilateral pleural effusions layering posteriorly. The endotracheal tube tip lies approximately 2.4 cm above the carina. The feeding tube tip projects below the inferior margin of the image. The right PICC line tip projects over the midportion of the SVC.  There is calcification in the wall of the aortic arch.  IMPRESSION: Stable bibasilar atelectasis or pneumonia with smaller moderate-sized pleural effusions. Pulmonary vascular congestion and pulmonary interstitial edema, stable. The support tubes are in reasonable position.  Thoracic aortic atherosclerosis.   Electronically Signed   By: David  Swazil[place]Jordan M.D.   On: 10/25/2016 07:33  Assessment/Plan: S/P Procedure(s) (LRB): STERNOTOMY - Repair of Left Atrium (N/A) CLIPPING OF ATRIAL APPENDAGE (N/A)  CV: hemodynamics stable. Rate controlled permanent atrial fib.  Resp: VDRF secondary to deconditioning, possible pneumonia, atelectasis and probably some edema. CXR looks stable. CCM is following. I think it will be tough getting her back off the vent due to her age and preop condition.  ID: afebrile, mild leukocytosis stable. Sputum gram stain unremarkable, culture pending. Continue antibiotic pending cultures. Nurse reports still not sucking much out of lungs.  Renal: stable. She had total  body volume excess due to peripheral edema but probably intravascularly euvolemic to dry. She has contraction alkalosis. Avoid over-diuresis. I doubt that she has cardiogenic pulmonary edema with normal LV function on preop echo  and intra-op TEE. Magnesium and potassium and phosphorus low. Replacement ordered.  Neuro: stable  GI: hx of GIB on anticoagulation. Will not use any. Her LAA is clipped. SCD's for DVT prophylaxis. Resume tube feeds.  I think her prognosis for successful extubation and progression is guarded.   LOS: 8 days    Alleen BorneBryan K Lorene Klimas 10/25/2016

## 2016-10-25 NOTE — Progress Notes (Signed)
eLink Physician-Brief Progress Note Patient Name: Julie AmmonsJoanne S [name] DOB: [birth year] MRN: [id number]   Date of Service  10/25/2016  HPI/Events of Note  Hypokalemia, hypophos, moderately low mag.  On standing potassium replacement  eICU Interventions  Additional potassium, phos and mag replaced     Intervention Category Intermediate Interventions: Electrolyte abnormality - evaluation and management  DETERDING,ELIZABETH 10/25/2016, 6:45 AM

## 2016-10-25 NOTE — Progress Notes (Signed)
RT placed back back on rest mode due to increased WOB. Pt was weaning well and had a visitor. Pt them became SOB. I believe it was more anxiety related that respiratory distress. Will cont to monitor

## 2016-10-26 ENCOUNTER — Inpatient Hospital Stay (HOSPITAL_COMMUNITY): Payer: Medicare Other

## 2016-10-26 LAB — CULTURE, RESPIRATORY
CULTURE: NO GROWTH
SPECIAL REQUESTS: NORMAL

## 2016-10-26 LAB — CBC
HEMATOCRIT: 31.4 % — AB (ref 36.0–46.0)
HEMOGLOBIN: 9.7 g/dL — AB (ref 12.0–15.0)
MCH: 30.1 pg (ref 26.0–34.0)
MCHC: 30.9 g/dL (ref 30.0–36.0)
MCV: 97.5 fL (ref 78.0–100.0)
Platelets: 335 10*3/uL (ref 150–400)
RBC: 3.22 MIL/uL — ABNORMAL LOW (ref 3.87–5.11)
RDW: 21 % — ABNORMAL HIGH (ref 11.5–15.5)
WBC: 15.2 10*3/uL — AB (ref 4.0–10.5)

## 2016-10-26 LAB — BASIC METABOLIC PANEL
ANION GAP: 7 (ref 5–15)
BUN: 35 mg/dL — ABNORMAL HIGH (ref 6–20)
CHLORIDE: 107 mmol/L (ref 101–111)
CO2: 32 mmol/L (ref 22–32)
Calcium: 7.5 mg/dL — ABNORMAL LOW (ref 8.9–10.3)
Creatinine, Ser: 0.9 mg/dL (ref 0.44–1.00)
GFR calc non Af Amer: 59 mL/min — ABNORMAL LOW (ref >60)
Glucose, Bld: 126 mg/dL — ABNORMAL HIGH (ref 65–99)
POTASSIUM: 3.9 mmol/L (ref 3.5–5.1)
Sodium: 146 mmol/L — ABNORMAL HIGH (ref 135–145)

## 2016-10-26 LAB — GLUCOSE, CAPILLARY
GLUCOSE-CAPILLARY: 119 mg/dL — AB (ref 65–99)
GLUCOSE-CAPILLARY: 142 mg/dL — AB (ref 65–99)
GLUCOSE-CAPILLARY: 125 mg/dL — AB (ref 65–99)
Glucose-Capillary: 127 mg/dL — ABNORMAL HIGH (ref 65–99)
Glucose-Capillary: 132 mg/dL — ABNORMAL HIGH (ref 65–99)

## 2016-10-26 LAB — CULTURE, RESPIRATORY W GRAM STAIN

## 2016-10-26 MED ORDER — CLONAZEPAM 0.5 MG PO TABS
0.2500 mg | ORAL_TABLET | Freq: Two times a day (BID) | ORAL | Status: DC
  Administered 2016-10-26 – 2016-10-27 (×4): 0.25 mg
  Filled 2016-10-26 (×5): qty 1

## 2016-10-26 MED ORDER — CLONAZEPAM 0.1 MG/ML ORAL SUSPENSION: 0.2500 mg | Freq: Two times a day (BID) | ORAL | Status: DC

## 2016-10-26 MED ORDER — METOPROLOL TARTRATE 25 MG/10 ML ORAL SUSPENSION
25.0000 mg | Freq: Two times a day (BID) | ORAL | Status: DC
  Administered 2016-10-26 – 2016-10-27 (×4): 25 mg
  Filled 2016-10-26 (×4): qty 10

## 2016-10-26 NOTE — Progress Notes (Signed)
TCTS DAILY ICU PROGRESS NOTE                   [address]            [place] [address]          [phone]   9 Days Post-Op Procedure(s) (LRB): STERNOTOMY - Repair of Left Atrium (N/A) CLIPPING OF ATRIAL APPENDAGE (N/A)  Total Length of Stay:  LOS: 9 days   Subjective: Sedated on vent   Objective: Vital signs in last 24 hours: Temp:  [97.7 F (36.5 C)-99.1 F (37.3 C)] 99.1 F (37.3 C) (12/16 0400) Pulse Rate:  [72-157] 98 (12/16 0800) Cardiac Rhythm: Atrial fibrillation (12/16 0800) Resp:  [14-27] 18 (12/16 0800) BP: (102-181)/(48-140) 169/55 (12/16 0800) SpO2:  [84 %-100 %] 97 % (12/16 0858) FiO2 (%):  [40 %] 40 % (12/16 0858)  Filed Weights   10/23/16 0500 10/24/16 0500 10/25/16 0500  Weight: 223 lb 15.8 oz (101.6 kg) 222 lb 10.6 oz (101 kg) 220 lb 14.4 oz (100.2 kg)    Weight change:    Hemodynamic parameters for last 24 hours:    Intake/Output from previous day: 12/15 0701 - 12/16 0700 In: 3142.5 [I.V.:922.5; NG/GT:1530; IV Piggyback:660] Out: 0802 [Urine:1655]  Intake/Output this shift: Total I/O In: 140 [I.V.:40; Other:30; NG/GT:70] Out: 90 [Urine:90]  Current Meds: Scheduled Meds: . budesonide (PULMICORT) nebulizer solution  0.25 mg Nebulization BID  . ceFEPime (MAXIPIME) IV  2 g Intravenous Q8H  . chlorhexidine gluconate (MEDLINE KIT)  15 mL Mouth Rinse BID  . clonazePAM  0.25 mg Per Tube BID  . feeding supplement (PRO-STAT SUGAR FREE 64)  30 mL Per Tube TID  . insulin aspart  0-24 Units Subcutaneous Q4H  . mouth rinse  15 mL Mouth Rinse QID  . metoprolol tartrate  25 mg Per Tube BID  . pantoprazole  40 mg Intravenous Q12H  . potassium chloride  20 mEq Per Tube BID  . sodium chloride flush  10-40 mL Intracatheter Q12H   Continuous Infusions: . sodium chloride Stopped (10/18/16 1400)  . sodium chloride Stopped (10/18/16 0701)  . sodium chloride 10 mL/hr at 10/25/16 1724  . feeding supplement (VITAL AF 1.2 CAL) 1,000 mL  (10/26/16 0800)  . fentaNYL infusion INTRAVENOUS 100 mcg/hr (10/26/16 0800)  . lactated ringers 10 mL/hr at 10/26/16 0800  . lactated ringers 10 mL/hr at 10/26/16 0800   PRN Meds:.sodium chloride, acetaminophen (TYLENOL) oral liquid 160 mg/5 mL, fentaNYL, metoprolol, midazolam, ondansetron (ZOFRAN) IV, sodium chloride flush  General appearance: sedated on vent  Neurologic: sedated  Heart: irregularly irregular rhythm Lungs: diminished breath sounds bibasilar Abdomen: soft, non-tender; bowel sounds normal; no masses,  no organomegaly Extremities: extremities normal, atraumatic, no cyanosis or edema and Homans sign is negative, no sign of DVT Wound: sternum intact  Lab Results: CBC: Recent Labs  10/25/16 0500 10/26/16 0424  WBC 13.4* 15.2*  HGB 9.6* 9.7*  HCT 30.3* 31.4*  PLT 285 335   BMET:  Recent Labs  10/25/16 0500 10/26/16 0424  NA 148* 146*  K 3.4* 3.9  CL 109 107  CO2 33* 32  GLUCOSE 147* 126*  BUN 37* 35*  CREATININE 0.96 0.90  CALCIUM 7.6* 7.5*    CMET: Lab Results  Component Value Date   WBC 15.2 (H) 10/26/2016   HGB 9.7 (L) 10/26/2016   HCT 31.4 (L) 10/26/2016   PLT 335 10/26/2016   GLUCOSE 126 (H) 10/26/2016   ALT 7 (L) 03/19/2015  AST 19 03/19/2015   NA 146 (H) 10/26/2016   K 3.9 10/26/2016   CL 107 10/26/2016   CREATININE 0.90 10/26/2016   BUN 35 (H) 10/26/2016   CO2 32 10/26/2016   INR 1.61 10/25/2016    PT/INR:  Recent Labs  10/25/16 0500  LABPROT 19.3*  INR 1.61   Radiology: Dg Chest Port 1 View  Result Date: 10/26/2016 CLINICAL DATA:  Pneumonia EXAM: PORTABLE CHEST 1 VIEW COMPARISON:  10/25/2016 FINDINGS: Endotracheal tube and right PICC line remain in place, unchanged. There is cardiomegaly with bilateral airspace opacities and layering effusions. No real change since prior study. IMPRESSION: Continued bilateral airspace opacities and layering effusions. No real change. Electronically Signed   By: Rolm Baptise M.D.   On:  10/26/2016 07:51     Assessment/Plan: S/P Procedure(s) (LRB): STERNOTOMY - Repair of Left Atrium (N/A) CLIPPING OF ATRIAL APPENDAGE (N/A) CV: hemodynamics stable. Rate controlled permanent atrial fib. Resp: VDRF secondary to deconditioning, possible pneumonia, atelectasis and probably some edema. CXR looks stable. CCM is following. I think it will be tough getting her back off the vent due to her age and preop condition. ID: afebrile, mild leukocytosis stable. Sputum gram stain unremarkable, culture pending. Continue antibiotic pending cultures. Nurse reports still not sucking much out of lungs. Renal: stable. Neuro: stable GI: hx of GIB on anticoagulation. Not on full anticoagulation currently . Her LAA is clipped. SCD's for DVT prophylaxis.  Continue  tube feeds.      [name] 10/26/2016 9:31 AM

## 2016-10-26 NOTE — Progress Notes (Signed)
Patient ID: Julie AmmonsJoanne S [name], female   DOB: 1935/01/25, 81 y.o.   MRN: [id number] EVENING ROUNDS NOTE :     [address]       [place] [id number]             [phone]                 9 Days Post-Op Procedure(s) (LRB): STERNOTOMY - Repair of Left Atrium (N/A) CLIPPING OF ATRIAL APPENDAGE (N/A)  Total Length of Stay:  LOS: 9 days  BP (!) 148/71   Pulse 90   Temp (!) 100.8 F (38.2 C)   Resp 20   Ht 5\' 4"  (1.626 m)   Wt 220 lb 14.4 oz (100.2 kg)   SpO2 99%   BMI 37.92 kg/m   .Intake/Output      12/16 0701 - 12/17 0700   I.V. (mL/kg) 305 (3)   Other 30   NG/GT 630   IV Piggyback 100   Total Intake(mL/kg) 1065 (10.6)   Urine (mL/kg/hr) 455 (0.4)   Stool    Total Output 455   Net +610         . sodium chloride Stopped (10/18/16 1400)  . sodium chloride Stopped (10/18/16 0701)  . sodium chloride 10 mL/hr at 10/25/16 1724  . feeding supplement (VITAL AF 1.2 CAL) 1,000 mL (10/26/16 0800)  . fentaNYL infusion INTRAVENOUS 50 mcg/hr (10/26/16 1300)  . lactated ringers 10 mL/hr at 10/26/16 0800  . lactated ringers 10 mL/hr at 10/26/16 0800     Lab Results  Component Value Date   WBC 15.2 (H) 10/26/2016   HGB 9.7 (L) 10/26/2016   HCT 31.4 (L) 10/26/2016   PLT 335 10/26/2016   GLUCOSE 126 (H) 10/26/2016   ALT 7 (L) 03/19/2015   AST 19 03/19/2015   NA 146 (H) 10/26/2016   K 3.9 10/26/2016   CL 107 10/26/2016   CREATININE 0.90 10/26/2016   BUN 35 (H) 10/26/2016   CO2 32 10/26/2016   INR 1.61 10/25/2016   Not weanable - from vent   Delight OvensEdward B Kaycee Haycraft MD  Beeper [phone] Office [phone] 10/26/2016 7:31 PM

## 2016-10-26 NOTE — Progress Notes (Signed)
PCCM CONSULT NOTE  Admission date: 10/20/2016 Consult date: 10/23/2016 Referring provider: Dr. Laneta SimmersBartle  CC: Short of breath  HPI: 81 yo female presented 01-07-2016 for implantation of Lt atrial appendage occluder.  She has permanent A fib on eliquis.  She developed hypotension post procedure from pericardial effusion and tamponade physiology.  She had emergent sternotomy with clipping of atrial appendage.  She improved after surgery and was extubated.  She developed worsening hypoxia with pulmonary infiltrates on 12/13 and required intubation.  She was started on Abx, and continued on diuresis.  She was hypotensive post-procedure and noted to have maroon colored stool.  She has hx of GI bleeding.  Subjective: Tolerating PS 5/5.  Hypertensive. Follows some commands intermittently. Agitated this am per nurse.   Vital signs: BP (!) 169/55 (BP Location: Left Arm)   Pulse 98   Temp 99.1 F (37.3 C) (Other (Comment))   Resp 18   Ht 5\' 4"  (1.626 m)   Wt 100.2 kg (220 lb 14.4 oz)   SpO2 97%   BMI 37.92 kg/m   Intake/output: I/O last 3 completed shifts: In: 4762.5 [I.V.:1577.5; Other:30; ZO/XW:9604G/GT:2445; IV Piggyback:710] Out: 3130 [Urine:2980; Stool:150]  General: ill appearing, frail Neuro: follows simple commands intermittently, weak, MAE  HEENT: ETT in place Cardiac: sternal wound clean, s1 s2 irregular Chest: resps even non labored on PS 5/5,  Few crackles Abd: soft, non tender Ext: 1+ edema Skin: multiple areas of ecchymosis   CMP Latest Ref Rng & Units 10/26/2016 10/25/2016 10/24/2016  Glucose 65 - 99 mg/dL 540(J126(H) 811(B147(H) 147(W100(H)  BUN 6 - 20 mg/dL 29(F35(H) 62(Z37(H) 30(Q33(H)  Creatinine 0.44 - 1.00 mg/dL 6.570.90 8.460.96 9.621.00  Sodium 135 - 145 mmol/L 146(H) 148(H) 147(H)  Potassium 3.5 - 5.1 mmol/L 3.9 3.4(L) 3.7  Chloride 101 - 111 mmol/L 107 109 109  CO2 22 - 32 mmol/L 32 33(H) 31  Calcium 8.9 - 10.3 mg/dL 7.5(L) 7.6(L) 7.7(L)  Total Protein 6.5 - 8.1 g/dL - - -  Total Bilirubin 0.3 - 1.2  mg/dL - - -  Alkaline Phos 38 - 126 U/L - - -  AST 15 - 41 U/L - - -  ALT 14 - 54 U/L - - -    CBC Latest Ref Rng & Units 10/26/2016 10/25/2016 10/24/2016  WBC 4.0 - 10.5 K/uL 15.2(H) 13.4(H) 14.1(H)  Hemoglobin 12.0 - 15.0 g/dL 9.5(M9.7(L) 8.4(X9.6(L) 10.0(L)  Hematocrit 36.0 - 46.0 % 31.4(L) 30.3(L) 31.3(L)  Platelets 150 - 400 K/uL 335 285 270    ABG    Component Value Date/Time   PHART 7.467 (H) 10/25/2016 0403   PCO2ART 48.6 (H) 10/25/2016 0403   PO2ART 151.0 (H) 10/25/2016 0403   HCO3 35.2 (H) 10/25/2016 0403   TCO2 37 10/25/2016 0403   ACIDBASEDEF 4.0 (H) 10/18/2016 1355   O2SAT 99.0 10/25/2016 0403    CBG (last 3)   Recent Labs  10/25/16 2323 10/26/16 0352 10/26/16 0758  GLUCAP 90 127* 119*     Imaging: Dg Chest Port 1 View  Result Date: 10/26/2016 CLINICAL DATA:  Pneumonia EXAM: PORTABLE CHEST 1 VIEW COMPARISON:  10/25/2016 FINDINGS: Endotracheal tube and right PICC line remain in place, unchanged. There is cardiomegaly with bilateral airspace opacities and layering effusions. No real change since prior study. IMPRESSION: Continued bilateral airspace opacities and layering effusions. No real change. Electronically Signed   By: Charlett NoseKevin  Dover M.D.   On: 10/26/2016 07:51   Dg Chest Port 1 View  Result Date: 10/25/2016 CLINICAL DATA:  Intubated  patient, respiratory failure. EXAM: PORTABLE CHEST 1 VIEW COMPARISON:  Portable chest x-ray of October 24, 2016 FINDINGS: The lungs are adequately inflated. The interstitial markings remain increased. Bilateral infiltrates are present in the mid and lower lung zones. The hemidiaphragms are obscured. The cardiac silhouette is top-normal in size. The left atrial appendage clip appears stable. The pulmonary vascularity is engorged and indistinct. There are small bilateral pleural effusions layering posteriorly. The endotracheal tube tip lies approximately 2.4 cm above the carina. The feeding tube tip projects below the inferior margin of  the image. The right PICC line tip projects over the midportion of the SVC. There is calcification in the wall of the aortic arch. IMPRESSION: Stable bibasilar atelectasis or pneumonia with smaller moderate-sized pleural effusions. Pulmonary vascular congestion and pulmonary interstitial edema, stable. The support tubes are in reasonable position. Thoracic aortic atherosclerosis. Electronically Signed   By: David  Swazil[place]Jordan M.D.   On: 10/25/2016 07:33    Cultures: Sputum 12/13 >>  Antibiotics:  Cefepime 12/13 >>   Events: 12/07 Admit, Watchman procedure, cardiac tamponade, emergent sternotomy 12/13 Hypoxia, VDRF, hypotension after intubation, melanotic stool 12/15 Off protonix gtt  Lines/tubes: Rt chest tube 12/07 >> 12/10 Femoral CVL 12/07 >> 12/09 ETT 12/07 >> 12/08 Rt PICC 12/09 >>  ETT 12/13 >>   Assessment/plan:  Acute hypoxic respiratory failure from acute pulmonary edema +/- HCAP. - full vent support - continue daily attempt at SBT - f/u CXR - doubt she will be able to wean off vent anytime soon, and would likely need trach  Possible HCAP. - Day 4 cefepime - f/u sputum cx from 12/13  Cardiac tamponade s/p sternotomy. Permanent A fib. Hypotension after intubation - resolved Hx of HTN, HLD. - per TCTS and cardiology - will resume low dose metoprolol per tube (home med) with rising BP and tachy  Hypokalemia. Hypophosphatemia. - replace electrolytes PRN  Melanotic stool noted 12/13 with hx of GIB >> no further episodes since. - protonix q12h  Anemia of critical illness. - f/u CBC  DM type II. - SSI  Acute metabolic encephalopathy. - RASS goal 0 to -1 - fentanyl gtt - wean as able  - will add low dose clonazepam for agitation and attempt wean fent gtt to off   DVT prophylaxis - SCDs SUP - protonix  Nutrition - tube feeds Goals of care - full code  No family available 12/16  Dirk DressKaty Whiteheart, NP 10/26/2016  9:28 AM Pager: [phone] or ([phone])  [phone]  Tolerating pressure support better.  Still agitated.  B/l faint crackles, but better air movement.  1+ edema.  Abd soft.  Assessment/plan:  Acute respiratory failure. - continue pressure support >> not ready for extubation trial - concerned her overall deconditioned state will limit her ability to stay off vent, and she might need trach to assist with vent weaning  HCAP. - continue Abx  CC time by me independent of APP time 31 minutes  Coralyn HellingVineet Ivyana Locey, MD [facility] 10/26/2016, 12:25 PM Pager:  [phone] After 3pm call: [phone]

## 2016-10-26 NOTE — Progress Notes (Signed)
Pt placed back on FS. On arrival tolerating wean well 10/5 40% no distress or complications noted.

## 2016-10-27 ENCOUNTER — Inpatient Hospital Stay (HOSPITAL_COMMUNITY): Payer: Medicare Other

## 2016-10-27 LAB — COMPREHENSIVE METABOLIC PANEL
ALT: 10 U/L — ABNORMAL LOW (ref 14–54)
AST: 20 U/L (ref 15–41)
Albumin: 2 g/dL — ABNORMAL LOW (ref 3.5–5.0)
Alkaline Phosphatase: 87 U/L (ref 38–126)
Anion gap: 9 (ref 5–15)
BUN: 44 mg/dL — ABNORMAL HIGH (ref 6–20)
CO2: 29 mmol/L (ref 22–32)
Calcium: 7.8 mg/dL — ABNORMAL LOW (ref 8.9–10.3)
Chloride: 109 mmol/L (ref 101–111)
Creatinine, Ser: 0.93 mg/dL (ref 0.44–1.00)
GFR calc Af Amer: >60 mL/min (ref >60)
GFR calc non Af Amer: 57 mL/min — ABNORMAL LOW (ref >60)
Glucose, Bld: 124 mg/dL — ABNORMAL HIGH (ref 65–99)
Potassium: 4.5 mmol/L (ref 3.5–5.1)
Sodium: 147 mmol/L — ABNORMAL HIGH (ref 135–145)
Total Bilirubin: 1.4 mg/dL — ABNORMAL HIGH (ref 0.3–1.2)
Total Protein: 5.3 g/dL — ABNORMAL LOW (ref 6.5–8.1)

## 2016-10-27 LAB — GLUCOSE, CAPILLARY
GLUCOSE-CAPILLARY: 123 mg/dL — AB (ref 65–99)
GLUCOSE-CAPILLARY: 114 mg/dL — AB (ref 65–99)
GLUCOSE-CAPILLARY: 137 mg/dL — AB (ref 65–99)
Glucose-Capillary: 120 mg/dL — ABNORMAL HIGH (ref 65–99)
Glucose-Capillary: 125 mg/dL — ABNORMAL HIGH (ref 65–99)
Glucose-Capillary: 133 mg/dL — ABNORMAL HIGH (ref 65–99)

## 2016-10-27 LAB — CBC
HEMATOCRIT: 31.6 % — AB (ref 36.0–46.0)
Hemoglobin: 9.7 g/dL — ABNORMAL LOW (ref 12.0–15.0)
MCH: 30.6 pg (ref 26.0–34.0)
MCHC: 30.7 g/dL (ref 30.0–36.0)
MCV: 99.7 fL (ref 78.0–100.0)
Platelets: 349 10*3/uL (ref 150–400)
RBC: 3.17 MIL/uL — ABNORMAL LOW (ref 3.87–5.11)
RDW: 21.4 % — AB (ref 11.5–15.5)
WBC: 17.3 10*3/uL — AB (ref 4.0–10.5)

## 2016-10-27 MED ORDER — SODIUM CHLORIDE 0.9 % IV SOLN
INTRAVENOUS | Status: DC
  Administered 2016-10-27 – 2016-10-29 (×2): via INTRAVENOUS

## 2016-10-27 MED ORDER — SODIUM CHLORIDE 0.9 % IV SOLN
INTRAVENOUS | Status: DC
  Administered 2016-10-27: 17:00:00 via INTRAVENOUS
  Administered 2016-10-28: 10 mL/h via INTRAVENOUS

## 2016-10-27 NOTE — Progress Notes (Signed)
PCCM CONSULT NOTE  Admission date: 10/12/2016 Consult date: 10/23/2016 Referring provider: Dr. Laneta SimmersBartle  CC: Short of breath  HPI: 81 yo female presented 10/25/2016 for implantation of Lt atrial appendage occluder.  She has permanent A fib on eliquis.  She developed hypotension post procedure from pericardial effusion and tamponade physiology.  She had emergent sternotomy with clipping of atrial appendage.  She improved after surgery and was extubated.  She developed worsening hypoxia with pulmonary infiltrates on 12/13 and required intubation.  She was started on Abx, and continued on diuresis.  She was hypotensive post-procedure and noted to have maroon colored stool.  She has hx of GI bleeding.  Subjective: Tolerating pressure support.  Vital signs: BP (!) 99/48   Pulse 77   Temp 98.4 F (36.9 C) (Other (Comment)) Comment (Src): foley temp  Resp 18   Ht 5\' 4"  (1.626 m)   Wt 223 lb 1.7 oz (101.2 kg)   SpO2 99%   BMI 38.30 kg/m   Intake/output: I/O last 3 completed shifts: In: 3495 [I.V.:1115; Other:30; NG/GT:2150; IV Piggyback:200] Out: 1410 [Urine:1410]  General: ill appearing, frail Neuro: follows simple commands intermittently, weak, MAE  HEENT: ETT in place Cardiac: sternal wound clean, s1 s2 irregular Chest: resps even non labored on PS 5/5,  Few crackles Abd: soft, non tender Ext: 1+ edema Skin: multiple areas of ecchymosis   CMP Latest Ref Rng & Units 10/27/2016 10/26/2016 10/25/2016  Glucose 65 - 99 mg/dL 161(W124(H) 960(A126(H) 540(J147(H)  BUN 6 - 20 mg/dL 81(X44(H) 91(Y35(H) 78(G37(H)  Creatinine 0.44 - 1.00 mg/dL 9.560.93 2.130.90 0.860.96  Sodium 135 - 145 mmol/L 147(H) 146(H) 148(H)  Potassium 3.5 - 5.1 mmol/L 4.5 3.9 3.4(L)  Chloride 101 - 111 mmol/L 109 107 109  CO2 22 - 32 mmol/L 29 32 33(H)  Calcium 8.9 - 10.3 mg/dL 7.8(L) 7.5(L) 7.6(L)  Total Protein 6.5 - 8.1 g/dL 5.3(L) - -  Total Bilirubin 0.3 - 1.2 mg/dL 5.7(Q1.4(H) - -  Alkaline Phos 38 - 126 U/L 87 - -  AST 15 - 41 U/L 20 - -  ALT 14  - 54 U/L 10(L) - -    CBC Latest Ref Rng & Units 10/27/2016 10/26/2016 10/25/2016  WBC 4.0 - 10.5 K/uL 17.3(H) 15.2(H) 13.4(H)  Hemoglobin 12.0 - 15.0 g/dL 4.6(N9.7(L) 6.2(X9.7(L) 5.2(W9.6(L)  Hematocrit 36.0 - 46.0 % 31.6(L) 31.4(L) 30.3(L)  Platelets 150 - 400 K/uL 349 335 285    ABG    Component Value Date/Time   PHART 7.467 (H) 10/25/2016 0403   PCO2ART 48.6 (H) 10/25/2016 0403   PO2ART 151.0 (H) 10/25/2016 0403   HCO3 35.2 (H) 10/25/2016 0403   TCO2 37 10/25/2016 0403   ACIDBASEDEF 4.0 (H) 10/18/2016 1355   O2SAT 99.0 10/25/2016 0403    CBG (last 3)   Recent Labs  10/27/16 0022 10/27/16 0355 10/27/16 0753  GLUCAP 133* 123* 114*     Imaging: Dg Chest Port 1 View  Result Date: 10/27/2016 CLINICAL DATA:  Followup bilateral airspace opacities and pleural fluid. EXAM: PORTABLE CHEST 1 VIEW COMPARISON:  Yesterday. FINDINGS: Endotracheal tube in satisfactory position. Feeding tube extending into the stomach. Right PICC tip in the inferior aspect of the superior vena cava. Stable median sternotomy wires and metallic device overlying the left heart. Stable enlarged cardiac silhouette. Mildly increased patchy opacity in the left lower lung zone without significant change in patchy opacity at the right lung base. The pulmonary vasculature and interstitial markings remain prominent. Small bilateral pleural effusions are unchanged. No  acute bony abnormality. IMPRESSION: 1. Mildly increased alveolar edema, pneumonia or aspiration pneumonitis in the left lower lung zone. 2. Stable right basilar atelectasis, pneumonia or aspiration pneumonitis. 3. Stable cardiomegaly and congestive heart failure with bilateral pleural effusions. Electronically Signed   By: Beckie SaltsSteven  Reid M.D.   On: 10/27/2016 07:54   Dg Chest Port 1 View  Result Date: 10/26/2016 CLINICAL DATA:  Pneumonia EXAM: PORTABLE CHEST 1 VIEW COMPARISON:  10/25/2016 FINDINGS: Endotracheal tube and right PICC line remain in place, unchanged. There  is cardiomegaly with bilateral airspace opacities and layering effusions. No real change since prior study. IMPRESSION: Continued bilateral airspace opacities and layering effusions. No real change. Electronically Signed   By: Charlett NoseKevin  Dover M.D.   On: 10/26/2016 07:51    Cultures: Sputum 12/13 >>  Antibiotics:  Cefepime 12/13 >>   Events: 12/07 Admit, Watchman procedure, cardiac tamponade, emergent sternotomy 12/13 Hypoxia, VDRF, hypotension after intubation, melanotic stool 12/15 Off protonix gtt  Lines/tubes: Rt chest tube 12/07 >> 12/10 Femoral CVL 12/07 >> 12/09 ETT 12/07 >> 12/08 Rt PICC 12/09 >>  ETT 12/13 >>   Assessment/plan:  Acute hypoxic respiratory failure from acute pulmonary edema +/- HCAP. - pressure support wean as tolerated - f/u CXR - would need to have goals of care clarified with family before attempting extubation again >> not sure she would be able to maintain herself off vent at this time  Possible HCAP. - Day 5 cefepime - f/u sputum cx from 12/13  Cardiac tamponade s/p sternotomy. Permanent A fib. Hypotension after intubation - resolved Hx of HTN, HLD. - per TCTS and cardiology  Hypokalemia. Hypophosphatemia. - replace electrolytes PRN  Melanotic stool noted 12/13 with hx of GIB >> no further episodes since. - protonix q12h  Anemia of critical illness. - f/u CBC  DM type II. - SSI  Acute metabolic encephalopathy. - RASS goal 0 to -1 - fentanyl gtt - wean as able  - continue klonopin  DVT prophylaxis - SCDs SUP - protonix  Nutrition - tube feeds Goals of care - full code  CC time 31 minutes  D/w Dr. Marguarite ArbourGerhardt  Quinterius Gaida, MD [facility] 10/27/2016, 10:51 AM Pager:  [phone] After 3pm call: [phone]

## 2016-10-27 NOTE — Progress Notes (Addendum)
Hanging Fentanyl drip noted to be expired. 70 cc/ 700 mcg wasted and witnessed by C. Alferd ApaMoseley RN.  I, Dianna Rossettiharles Jaicey Sweaney, RN witnessed this waste.

## 2016-10-27 NOTE — Progress Notes (Signed)
Patient ID: Vernie AmmonsJoanne S Fedewa, female   DOB: 12/31/1934, 81 y.o.   MRN: [id number] EVENING ROUNDS NOTE :     [address]       [place] [id number]             [phone]                 10 Days Post-Op Procedure(s) (LRB): STERNOTOMY - Repair of Left Atrium (N/A) CLIPPING OF ATRIAL APPENDAGE (N/A)  Total Length of Stay:  LOS: 10 days  BP (!) 141/67   Pulse 83   Temp 98.2 F (36.8 C) (Other (Comment)) Comment (Src): foley probe  Resp 17   Ht 5\' 4"  (1.626 m)   Wt 223 lb 1.7 oz (101.2 kg)   SpO2 98%   BMI 38.30 kg/m   .Intake/Output      12/17 0701 - 12/18 0700   I.V. (mL/kg) 292 (2.9)   Other    NG/GT 670   IV Piggyback 100   Total Intake(mL/kg) 1062 (10.5)   Urine (mL/kg/hr) 598 (0.5)   Emesis/NG output 0 (0)   Stool 0 (0)   Total Output 598   Net +464         . sodium chloride 10 mL/hr at 10/27/16 1900  . sodium chloride 10 mL/hr at 10/27/16 1900  . feeding supplement (VITAL AF 1.2 CAL) 1,000 mL (10/27/16 1900)  . fentaNYL infusion INTRAVENOUS 25 mcg/hr (10/27/16 1900)     Lab Results  Component Value Date   WBC 17.3 (H) 10/27/2016   HGB 9.7 (L) 10/27/2016   HCT 31.6 (L) 10/27/2016   PLT 349 10/27/2016   GLUCOSE 124 (H) 10/27/2016   ALT 10 (L) 10/27/2016   AST 20 10/27/2016   NA 147 (H) 10/27/2016   K 4.5 10/27/2016   CL 109 10/27/2016   CREATININE 0.93 10/27/2016   BUN 44 (H) 10/27/2016   CO2 29 10/27/2016   INR 1.61 10/25/2016   Opens eyes but not improving   Delight OvensEdward B Hendrixx Severin MD  Beeper [phone] Office [phone] 10/27/2016 7:20 PM

## 2016-10-27 NOTE — Progress Notes (Signed)
Patient ID: [name], female   DOB: Sep 02, 1935, 81 y.o.   MRN: [id number] TCTS DAILY ICU PROGRESS NOTE                   [address]            [place] [address]          [phone]   10 Days Post-Op Procedure(s) (LRB): STERNOTOMY - Repair of Left Atrium (N/A) CLIPPING OF ATRIAL APPENDAGE (N/A)  Total Length of Stay:  LOS: 10 days   Subjective: Sedated on vent but does open eyes to name   Objective: Vital signs in last 24 hours: Temp:  [98.2 F (36.8 C)-100.8 F (38.2 C)] 98.4 F (36.9 C) (12/17 0800) Pulse Rate:  [60-94] 80 (12/17 0800) Cardiac Rhythm: Atrial fibrillation (12/17 0745) Resp:  [10-24] 19 (12/17 0800) BP: (93-174)/(42-147) 135/51 (12/17 0800) SpO2:  [96 %-99 %] 99 % (12/17 0844) FiO2 (%):  [40 %] 40 % (12/17 0844) Weight:  [223 lb 1.7 oz (101.2 kg)] 223 lb 1.7 oz (101.2 kg) (12/17 0406)  Filed Weights   10/24/16 0500 10/25/16 0500 10/27/16 0406  Weight: 222 lb 10.6 oz (101 kg) 220 lb 14.4 oz (100.2 kg) 223 lb 1.7 oz (101.2 kg)    Weight change:    Hemodynamic parameters for last 24 hours:    Intake/Output from previous day: 12/16 0701 - 12/17 0700 In: 2215 [I.V.:635; NG/GT:1400; IV Piggyback:150] Out: 1005 [Urine:1005]  Intake/Output this shift: Total I/O In: 84.4 [I.V.:24.4; NG/GT:60] Out: 105 [Urine:105]  Current Meds: Scheduled Meds: . budesonide (PULMICORT) nebulizer solution  0.25 mg Nebulization BID  . ceFEPime (MAXIPIME) IV  2 g Intravenous Q8H  . chlorhexidine gluconate (MEDLINE KIT)  15 mL Mouth Rinse BID  . clonazePAM  0.25 mg Per Tube BID  . feeding supplement (PRO-STAT SUGAR FREE 64)  30 mL Per Tube TID  . insulin aspart  0-24 Units Subcutaneous Q4H  . mouth rinse  15 mL Mouth Rinse QID  . metoprolol tartrate  25 mg Per Tube BID  . pantoprazole  40 mg Intravenous Q12H  . potassium chloride  20 mEq Per Tube BID  . sodium chloride flush  10-40 mL Intracatheter Q12H   Continuous Infusions: . sodium  chloride Stopped (10/18/16 1400)  . sodium chloride Stopped (10/18/16 0701)  . sodium chloride 10 mL/hr at 10/25/16 1724  . feeding supplement (VITAL AF 1.2 CAL) 1,000 mL (10/27/16 0800)  . fentaNYL infusion INTRAVENOUS 25 mcg/hr (10/27/16 0800)  . lactated ringers 10 mL/hr at 10/27/16 0800  . lactated ringers 10 mL/hr at 10/27/16 0800   PRN Meds:.sodium chloride, acetaminophen (TYLENOL) oral liquid 160 mg/5 mL, fentaNYL, metoprolol, midazolam, ondansetron (ZOFRAN) IV, sodium chloride flush  General appearance: slowed mentation and opens eyes to name follows simple commands  Neurologic: hard to fully evauate , but follows simple commands  Heart: irregularly irregular rhythm Lungs: diminished breath sounds bilaterally Abdomen: soft, non-tender; bowel sounds normal; no masses,  no organomegaly Extremities: extremities normal, atraumatic, no cyanosis or edema and Homans sign is negative, no sign of DVT Wound: incision intact   Lab Results: CBC: Recent Labs  10/26/16 0424 10/27/16 0400  WBC 15.2* 17.3*  HGB 9.7* 9.7*  HCT 31.4* 31.6*  PLT 335 349   BMET:  Recent Labs  10/26/16 0424 10/27/16 0400  NA 146* 147*  K 3.9 4.5  CL 107 109  CO2 32 29  GLUCOSE 126* 124*  BUN 35* 44*  CREATININE 0.90 0.93  CALCIUM 7.5* 7.8*    CMET: Lab Results  Component Value Date   WBC 17.3 (H) 10/27/2016   HGB 9.7 (L) 10/27/2016   HCT 31.6 (L) 10/27/2016   PLT 349 10/27/2016   GLUCOSE 124 (H) 10/27/2016   ALT 10 (L) 10/27/2016   AST 20 10/27/2016   NA 147 (H) 10/27/2016   K 4.5 10/27/2016   CL 109 10/27/2016   CREATININE 0.93 10/27/2016   BUN 44 (H) 10/27/2016   CO2 29 10/27/2016   INR 1.61 10/25/2016    PT/INR:  Recent Labs  10/25/16 0500  LABPROT 19.3*  INR 1.61   Radiology: Dg Chest Port 1 View  Result Date: 10/27/2016 CLINICAL DATA:  Followup bilateral airspace opacities and pleural fluid. EXAM: PORTABLE CHEST 1 VIEW COMPARISON:  Yesterday. FINDINGS: Endotracheal tube  in satisfactory position. Feeding tube extending into the stomach. Right PICC tip in the inferior aspect of the superior vena cava. Stable median sternotomy wires and metallic device overlying the left heart. Stable enlarged cardiac silhouette. Mildly increased patchy opacity in the left lower lung zone without significant change in patchy opacity at the right lung base. The pulmonary vasculature and interstitial markings remain prominent. Small bilateral pleural effusions are unchanged. No acute bony abnormality. IMPRESSION: 1. Mildly increased alveolar edema, pneumonia or aspiration pneumonitis in the left lower lung zone. 2. Stable right basilar atelectasis, pneumonia or aspiration pneumonitis. 3. Stable cardiomegaly and congestive heart failure with bilateral pleural effusions. Electronically Signed   By: Claudie Revering M.D.   On: 10/27/2016 07:54     Assessment/Plan: S/P Procedure(s) (LRB): STERNOTOMY - Repair of Left Atrium (N/A) CLIPPING OF ATRIAL APPENDAGE (N/A) Renal function stable  Remains on vent  severely malnourishment  Likely will need to make decisions about trach vs extubation- should have input from EP and family about long term goals     [name] 10/27/2016 9:10 AM

## 2016-10-28 ENCOUNTER — Inpatient Hospital Stay (HOSPITAL_COMMUNITY): Payer: Medicare Other

## 2016-10-28 DIAGNOSIS — Z515 Encounter for palliative care: Secondary | ICD-10-CM

## 2016-10-28 DIAGNOSIS — R112 Nausea with vomiting, unspecified: Secondary | ICD-10-CM

## 2016-10-28 DIAGNOSIS — G9340 Encephalopathy, unspecified: Secondary | ICD-10-CM

## 2016-10-28 DIAGNOSIS — Z66 Do not resuscitate: Secondary | ICD-10-CM

## 2016-10-28 DIAGNOSIS — R0600 Dyspnea, unspecified: Secondary | ICD-10-CM

## 2016-10-28 DIAGNOSIS — R0609 Other forms of dyspnea: Secondary | ICD-10-CM

## 2016-10-28 DIAGNOSIS — J9621 Acute and chronic respiratory failure with hypoxia: Secondary | ICD-10-CM

## 2016-10-28 LAB — BASIC METABOLIC PANEL
Anion gap: 5 (ref 5–15)
BUN: 46 mg/dL — ABNORMAL HIGH (ref 6–20)
CO2: 31 mmol/L (ref 22–32)
Calcium: 7.9 mg/dL — ABNORMAL LOW (ref 8.9–10.3)
Chloride: 111 mmol/L (ref 101–111)
Creatinine, Ser: 0.85 mg/dL (ref 0.44–1.00)
GFR calc Af Amer: >60 mL/min (ref >60)
GFR calc non Af Amer: >60 mL/min (ref >60)
Glucose, Bld: 113 mg/dL — ABNORMAL HIGH (ref 65–99)
Potassium: 4.6 mmol/L (ref 3.5–5.1)
Sodium: 147 mmol/L — ABNORMAL HIGH (ref 135–145)

## 2016-10-28 LAB — GLUCOSE, CAPILLARY
GLUCOSE-CAPILLARY: 125 mg/dL — AB (ref 65–99)
GLUCOSE-CAPILLARY: 128 mg/dL — AB (ref 65–99)
GLUCOSE-CAPILLARY: 133 mg/dL — AB (ref 65–99)
Glucose-Capillary: 110 mg/dL — ABNORMAL HIGH (ref 65–99)

## 2016-10-28 LAB — CULTURE, RESPIRATORY

## 2016-10-28 LAB — CULTURE, RESPIRATORY W GRAM STAIN: Culture: NORMAL

## 2016-10-28 LAB — CBC
HCT: 32.5 % — ABNORMAL LOW (ref 36.0–46.0)
Hemoglobin: 9.9 g/dL — ABNORMAL LOW (ref 12.0–15.0)
MCH: 31.1 pg (ref 26.0–34.0)
MCHC: 30.5 g/dL (ref 30.0–36.0)
MCV: 102.2 fL — ABNORMAL HIGH (ref 78.0–100.0)
Platelets: 371 10*3/uL (ref 150–400)
RBC: 3.18 MIL/uL — ABNORMAL LOW (ref 3.87–5.11)
RDW: 22 % — ABNORMAL HIGH (ref 11.5–15.5)
WBC: 18.9 10*3/uL — ABNORMAL HIGH (ref 4.0–10.5)

## 2016-10-28 MED ORDER — BUDESONIDE 0.5 MG/2ML IN SUSP: 0.5000 mg | Freq: Two times a day (BID) | RESPIRATORY_TRACT | Status: DC

## 2016-10-28 MED ORDER — GLYCOPYRROLATE 0.2 MG/ML IJ SOLN
0.4000 mg | Freq: Four times a day (QID) | INTRAMUSCULAR | Status: DC
  Administered 2016-10-28 – 2016-10-29 (×5): 0.4 mg via INTRAVENOUS
  Filled 2016-10-28 (×6): qty 2

## 2016-10-28 MED ORDER — CLONAZEPAM 0.5 MG PO TABS: 0.2500 mg | ORAL_TABLET | Freq: Every day | ORAL | Status: DC

## 2016-10-28 MED ORDER — MIDAZOLAM HCL 2 MG/2ML IJ SOLN
2.0000 mg | INTRAMUSCULAR | Status: DC | PRN
  Administered 2016-10-29 (×3): 2 mg via INTRAVENOUS
  Filled 2016-10-28 (×3): qty 2

## 2016-10-28 NOTE — Plan of Care (Signed)
Problem: Education: Goal: Knowledge of the prescribed therapeutic regimen will improve Outcome: Progressing HCPOA educated on plan  Problem: Coping: Goal: Ability to identify and develop effective coping behavior will improve Outcome: Progressing Education provided with friends present  Problem: Physical Regulation: Goal: Complications related to the disease process, condition or treatment will be avoided or minimized Outcome: Not Progressing PT terminally extubated 10/28/2016.  Goal: Quality of life will improve Outcome: Not Progressing See above  Problem: Respiratory: Goal: Verbalizations of increased ease of respirations will increase Outcome: Not Progressing Pt nonverbal at this time  Problem: Role Relationship: Goal: Familys ability to cope with current situation will improve Outcome: Progressing Pt appropriate at bedside Goal: Ability to verbalize concerns, feelings, and thoughts to partner or family member will improve Outcome: Not Progressing Pt nonverbal Goal: Ability to express an understanding of role expectations in relation to illness will improve Outcome: Not Progressing See above  Problem: Pain Management: Goal: General experience of comfort will improve Outcome: Progressing Pt appears comfortable at this time. On fentyal gtt

## 2016-10-28 NOTE — Progress Notes (Signed)
LCSW aware of consult for discharge planning. Patient remains on vent and per notes, MD working to reach family to discuss goals of care and plans.  LCSW will continue to follow acutely for needs and assist with disposition.  Deretha EmoryHannah Jaliya Siegmann LCSW, MSW Clinical Social Work: Optician, dispensingystem [employer] Coverage for :   [phone]

## 2016-10-28 NOTE — Progress Notes (Addendum)
      [address]       [place] [id number]             [phone]      11 Days Post-Op Procedure(s) (LRB): STERNOTOMY - Repair of Left Atrium (N/A) CLIPPING OF ATRIAL APPENDAGE (N/A)   Subjective:  Remains on vent, patient was responsive yesterday, currently unresponsive... She does have some twitching of her left arm. Opens eyes but not following commands   Objective: Vital signs in last 24 hours: Temp:  [98.1 F (36.7 C)-98.6 F (37 C)] 98.1 F (36.7 C) (12/18 0400) Pulse Rate:  [51-104] 51 (12/18 0800) Cardiac Rhythm: Atrial fibrillation (12/18 0730) Resp:  [16-23] 18 (12/18 0800) BP: (88-151)/(38-96) 128/56 (12/18 0800) SpO2:  [97 %-100 %] 100 % (12/18 0823) FiO2 (%):  [40 %] 40 % (12/18 0823) Weight:  [224 lb 3.3 oz (101.7 kg)] 224 lb 3.3 oz (101.7 kg) (12/18 0700)  Intake/Output from previous day: 12/17 0701 - 12/18 0700 In: 2072 [I.V.:562; NG/GT:1360; IV Piggyback:150] Out: 1455 [Urine:1455] Intake/Output this shift: Total I/O In: 91 [I.V.:21; NG/GT:70] Out: 40 [Urine:40]  General appearance: on vent, blank staring, not responsive Heart: regular rate and rhythm Lungs: coarse Abdomen: non-distended, minimal to no BS Wound: clean and dry  Lab Results:  Recent Labs  10/27/16 0400 10/28/16 0401  WBC 17.3* 18.9*  HGB 9.7* 9.9*  HCT 31.6* 32.5*  PLT 349 371   BMET:  Recent Labs  10/27/16 0400 10/28/16 0401  NA 147* 147*  K 4.5 4.6  CL 109 111  CO2 29 31  GLUCOSE 124* 113*  BUN 44* 46*  CREATININE 0.93 0.85  CALCIUM 7.8* 7.9*    PT/INR: No results for input(s): LABPROT, INR in the last 72 hours. ABG    Component Value Date/Time   PHART 7.467 (H) 10/25/2016 0403   HCO3 35.2 (H) 10/25/2016 0403   TCO2 37 10/25/2016 0403   ACIDBASEDEF 4.0 (H) 10/18/2016 1355   O2SAT 99.0 10/25/2016 0403   CBG (last 3)   Recent Labs  10/28/16 0010 10/28/16 0345 10/28/16 0709  GLUCAP 133* 110* 125*    Assessment/Plan: S/P  Procedure(s) (LRB): STERNOTOMY - Repair of Left Atrium (N/A) CLIPPING OF ATRIAL APPENDAGE (N/A)  1. Remains on vent, minimally  unresponsive- less responsive then several days ago  from yesterday 2. CV- Brady at times, occasional pause Bp ranging from 120-150.Marland Kitchen. Lopressor discontinued, Currently not on antihypertensive therapy 3. Malnourishment 4. Palliative care consult- may be appropriate if patient's condition doesn't improve 5. Dispo- patient not unresponsive, have contacted EP to please come and evaluate patient and assist in further decision making process as she is familiar to their practice... Continue current supportive care  care for now   LOS: 11 days    BARRETT, ERIN 10/28/2016   Have discussed with Dr Johney FrameAllred, longer term care plan, palliative care vs trach & peg and LTAC  I have seen and examined [name] and agree with the above assessment  and plan.  Delight OvensEdward B Mecca Barga MD Beeper [phone] Office [phone] 10/28/2016 9:07 AM

## 2016-10-28 NOTE — Progress Notes (Signed)
PCCM CONSULT NOTE  Admission date: 11/10/2016 Consult date: 10/23/2016 Referring provider: Dr. Laneta Simmers  CC: Short of breath  HPI: 81 yo female presented 11/02/2016 for implantation of Lt atrial appendage occluder.  She has permanent A fib on eliquis.  She developed hypotension post procedure from pericardial effusion and tamponade physiology.  She had emergent sternotomy with clipping of atrial appendage.  She improved after surgery and was extubated.  She developed worsening hypoxia with pulmonary infiltrates on 12/13 and required intubation.  She was started on Abx, and continued on diuresis.  She was hypotensive post-procedure and noted to have maroon colored stool.  She has hx of GI bleeding.  Subjective:  RN reports concern for possible neuro change.  Pt remains on fentanyl gtt.  Episode of nausea / vomiting (yellow material) earlier in am.  TF on hold.  Palliative care consulted per primary.  Vital signs: BP (!) 156/67   Pulse 85   Temp 98.1 F (36.7 C) (Other (Comment)) Comment (Src): foley temp  Resp 18   Ht 5\' 4"  (1.626 m)   Wt 224 lb 3.3 oz (101.7 kg)   SpO2 98%   BMI 38.49 kg/m   Intake/output: I/O last 3 completed shifts: In: 3122 [I.V.:872; NG/GT:2050; IV Piggyback:200] Out: 2005 [Urine:2005]  General: ill appearing, frail Neuro: eyes open, generalized weakness, makes eye contact, moves LE's, no follow commands  HEENT: ETT in place Cardiac: sternal wound clean, s1 s2 irregular Chest: resps even non labored, lungs bilaterally coarse Abd: soft, non tender Ext: 1+ edema Skin: multiple areas of ecchymosis   CMP Latest Ref Rng & Units 10/28/2016 10/27/2016 10/26/2016  Glucose 65 - 99 mg/dL 161(W) 960(A) 540(J)  BUN 6 - 20 mg/dL 81(X) 91(Y) 78(G)  Creatinine 0.44 - 1.00 mg/dL 9.56 2.13 0.86  Sodium 135 - 145 mmol/L 147(H) 147(H) 146(H)  Potassium 3.5 - 5.1 mmol/L 4.6 4.5 3.9  Chloride 101 - 111 mmol/L 111 109 107  CO2 22 - 32 mmol/L 31 29 32  Calcium 8.9 - 10.3  mg/dL 7.9(L) 7.8(L) 7.5(L)  Total Protein 6.5 - 8.1 g/dL - 5.3(L) -  Total Bilirubin 0.3 - 1.2 mg/dL - 5.7(Q) -  Alkaline Phos 38 - 126 U/L - 87 -  AST 15 - 41 U/L - 20 -  ALT 14 - 54 U/L - 10(L) -   CBC Latest Ref Rng & Units 10/28/2016 10/27/2016 10/26/2016  WBC 4.0 - 10.5 K/uL 18.9(H) 17.3(H) 15.2(H)  Hemoglobin 12.0 - 15.0 g/dL 4.6(N) 6.2(X) 5.2(W)  Hematocrit 36.0 - 46.0 % 32.5(L) 31.6(L) 31.4(L)  Platelets 150 - 400 K/uL 371 349 335   ABG    Component Value Date/Time   PHART 7.467 (H) 10/25/2016 0403   PCO2ART 48.6 (H) 10/25/2016 0403   PO2ART 151.0 (H) 10/25/2016 0403   HCO3 35.2 (H) 10/25/2016 0403   TCO2 37 10/25/2016 0403   ACIDBASEDEF 4.0 (H) 10/18/2016 1355   O2SAT 99.0 10/25/2016 0403    CBG (last 3)   Recent Labs  10/28/16 0010 10/28/16 0345 10/28/16 0709  GLUCAP 133* 110* 125*     Imaging: Dg Chest Port 1 View  Result Date: 10/28/2016 CLINICAL DATA:  Intubated EXAM: PORTABLE CHEST 1 VIEW COMPARISON:  Chest radiograph from one day prior. FINDINGS: Endotracheal tube tip is 2.1 cm above the carina. Enteric tube enters stomach with the tip not seen on this image. Right PICC terminates in the lower third of the superior vena cava. Stable configuration of median sternotomy wires. Stable cardiomediastinal silhouette with  moped cardiomegaly. No pneumothorax. Stable small bilateral pleural effusions. Patchy bibasilar lung opacities appear stable. Stable mild pulmonary edema. IMPRESSION: 1. Well-positioned support structures as described. 2. Stable mild congestive heart failure and small bilateral pleural effusions. 3. Stable patchy bibasilar lung opacities, which could represent atelectasis, aspiration or pneumonia. Electronically Signed   By: Delbert PhenixJason A Poff M.D.   On: 10/28/2016 07:38   Dg Chest Port 1 View  Result Date: 10/27/2016 CLINICAL DATA:  Followup bilateral airspace opacities and pleural fluid. EXAM: PORTABLE CHEST 1 VIEW COMPARISON:  Yesterday. FINDINGS:  Endotracheal tube in satisfactory position. Feeding tube extending into the stomach. Right PICC tip in the inferior aspect of the superior vena cava. Stable median sternotomy wires and metallic device overlying the left heart. Stable enlarged cardiac silhouette. Mildly increased patchy opacity in the left lower lung zone without significant change in patchy opacity at the right lung base. The pulmonary vasculature and interstitial markings remain prominent. Small bilateral pleural effusions are unchanged. No acute bony abnormality. IMPRESSION: 1. Mildly increased alveolar edema, pneumonia or aspiration pneumonitis in the left lower lung zone. 2. Stable right basilar atelectasis, pneumonia or aspiration pneumonitis. 3. Stable cardiomegaly and congestive heart failure with bilateral pleural effusions. Electronically Signed   By: Beckie SaltsSteven  Reid M.D.   On: 10/27/2016 07:54    Cultures: Sputum 12/13 >>  Antibiotics:  Cefepime 12/13 >>   Events: 12/07 Admit, Watchman procedure, cardiac tamponade, emergent sternotomy 12/13 Hypoxia, VDRF, hypotension after intubation, melanotic stool 12/15 Off protonix gtt 12/17 Weaned on PSV 12/18 Concern for mental status change > CT head   Lines/tubes: Rt chest tube 12/07 >> 12/10 Femoral CVL 12/07 >> 12/09 ETT 12/07 >> 12/08 Rt PICC 12/09 >>  ETT 12/13 >>   Assessment/plan:  Acute hypoxic respiratory failure from acute pulmonary edema +/- HCAP. - pressure support wean as tolerated - f/u CXR - would need to have goals of care clarified with family before attempting extubation again >> not sure she would be able to maintain herself off vent at this time  Possible HCAP. - Day 6 cefepime - f/u sputum cx from 12/16  Cardiac tamponade s/p sternotomy. Permanent A fib s/p Watchman Procedure. Hypotension after intubation - resolved Hx of HTN, HLD. - per TCTS and cardiology  Hypokalemia. Hypophosphatemia. - replace electrolytes PRN  Melanotic stool noted  12/13 with hx of GIB >> no further episodes since. - protonix q12h  Anemia of critical illness. - f/u CBC  DM type II. - SSI  Acute metabolic encephalopathy. R/O CNS Event - concern for change in neuro exam 12/18 - RASS goal 0 to -1 - fentanyl gtt - wean as able  - reduce klonopin to QHS dosing only with encephalopathy  - assess CT head to ensure no acute process   DVT prophylaxis - SCDs SUP - protonix  Nutrition - tube feeds Goals of care - full code  CC time 30 minutes  Canary BrimBrandi Ollis, NP-C [facility] Pgr: [phone] or if no answer [phone] 10/28/2016, 10:08 AM  Attending Note:  81 year old female s/p perforation after a watchman procedure that required a sternotomy with subsequent respiratory failure.  The patient's neuro exam is alarming this AM, patient is not following commands and not withdrawing to pain.  Requiring a lot of prompting to get patient to track in the room.  I am concerned that there may be an issue more than sedation this AM.  I reviewed CXR myself, ETT ok.  Continue full vent  support for now.  Head CT today.  Keep fluid even as able.  Hold TF x4 hours then restart.  May need to consider TPN if that continues to be an issue.  KVO IVF.  Replace electrolytes.  AM labs ordered.  The patient is critically ill with multiple organ systems failure and requires high complexity decision making for assessment and support, frequent evaluation and titration of therapies, application of advanced monitoring technologies and extensive interpretation of multiple databases.   Critical Care Time devoted to patient care services described in this note is  35  Minutes. This time reflects time of care of this signee Dr Koren BoundWesam Yacoub. This critical care time does not reflect procedure time, or teaching time or supervisory time of PA/NP/Med student/Med Resident etc but could involve care discussion time.  Alyson ReedyWesam G. Yacoub, M.D. [facility]. Pager: [phone]. After hours pager: [phone].

## 2016-10-28 NOTE — Progress Notes (Signed)
Once placed patient on CPAP/PSV mode this AM, patient noted to have possible seizure like activity.  Placed patient back on full support.  RN aware.  Will continue to monitor.

## 2016-10-28 NOTE — Consult Note (Signed)
Consultation Note Date: 10/28/2016   Patient Name: [name]  DOB: 1935/05/14  MRN: [id number]  Age / Sex: 81 y.o., female  PCP: Dorian Heckle, MD Referring Physician: Gaye Pollack, MD  Reason for Consultation: Establishing goals of care and Psychosocial/spiritual support  HPI/Patient Profile: 81 y.o. female   admitted on 10/19/2016  for implantation of Lt atrial appendage occluder.  She has permanent A fib on eliquis.  She developed hypotension post procedure from pericardial effusion and tamponade physiology.  She had emergent sternotomy with clipping of atrial appendage.  She improved after surgery and was extubated.  She developed worsening hypoxia with pulmonary infiltrates on 12/13 and required intubation.  She was started on Abx, and continued on diuresis.  She was hypotensive post-procedure and noted to have maroon colored stool.  She has hx of GI bleeding.  Cognition decreased over the past 24 hrs, head CT negative.  Patient is unresponsive to verbal stimuli.  Long term poor prognosis.   HPOAs face advanced directive decisions and treatment option decisions   Clinical Assessment and Goals of Care:  This NP Wadie Lessen reviewed medical records, received report from team, assessed the patient and then meet at the patient's bedside along with her family and friends [name], [name], [name] and [name])  to discuss diagnosis, prognosis, GOC, EOL wishes disposition and options.  Dr Joylene Grapes meet with family and discussed current medical situation and answered questions and concerns.  The difference between a aggressive medical intervention path  and a palliative comfort care path for this patient at this time was had.  Values and goals of care important to patient and family were attempted to be elicited.  Patient has a living will that details and family/friends "know" that [name] would not want any long term  support or extreme measures.   They speak to her recent continued physical and functional decline.  All present speak to their great love and appreciation of [name].  Main decision makers agree that liberation from the ventilator and focus on comfort and dignity is in the patient's best interest.   "She would not want  to go through this".  Plan is for  several other loved ones arrive, then  plan is for a one way extubation.  Natural trajectory and expectations at EOL were discussed.  Questions and concerns addressed.   PMT will continue to support holistically.   HCPOA/ [name] and [name]/life long friends    SUMMARY OF RECOMMENDATIONS    ( 1545) Liberation from the ventilator, family at bedside.  Fentanyl gtt in place with titration orders, prn Versed, scheduled Robinol.  Patient appears comfortable.   Code Status/Advance Care Planning:  DNR  Symptom Management:   Pain/Dyspnea: Fentanyl gtt with titration  Agitation: Versed prn  Terminal secretions: Robinul schedule qid  Palliative Prophylaxis:   Frequent Pain Assessment and Oral Care  Additional Recommendations (Limitations, Scope, Preferences):  Full Comfort Care  Psycho-social/Spiritual:   Desire for further Chaplaincy support:no-strong community church support  Additional  Recommendations: Compassionate Wean Education  Prognosis:   Hours - Days     Preferably patient will remain in this unit through the night, expect rapid decline.  If she stabilizes consider transition to 6N for eol care  Discharge Planning: Anticipated Hospital Death      Primary Diagnoses: Present on Admission: . Perforation of cardiac device   I have reviewed the medical record, interviewed the patient and family, and examined the patient. The following aspects are pertinent.  Past Medical History:  Diagnosis Date  . Anemia    a. 02/2014 in setting of GIB req 3U PRBC's.  . Cellulitis and abscess of leg, except foot   .  Chronic a-fib (HCC)    a. CHA2DS2VASc = 5 (HTN, age > 64, DM2, female);  b. Eliquis d/c'd 4/015 in setting of BRBPR/GIB;  c. Rate-control w/ BB.  . Diverticulosis   . Gastritis    a. 02/2014 EGD.  Marland Kitchen GIB (gastrointestinal bleeding)    a. 02/2014 BRBPR;  b. 02/2014 Colonoscopy/EGD: diverticulosis with polys/polypectomy - ascending colon, Gastritis on EDG.  . HTN (hypertension)   . Hyperlipidemia   . Obesity, unspecified   . Personal history of colonic polyps   . Plantar fascial fibromatosis   . RBBB   . Type II or unspecified type diabetes mellitus without mention of complication, not stated as uncontrolled    Social History   Social History  . Marital status: Single    Spouse name: N/A  . Number of children: N/A  . Years of education: N/A   Occupational History  . Retired    Social History Main Topics  . Smoking status: Never Smoker  . Smokeless tobacco: Never Used  . Alcohol use No     Comment: occasional  . Drug use: No  . Sexual activity: No   Other Topics Concern  . None   Social History Narrative  . None   Family History  Problem Relation Age of Onset  . Asthma Father   . Emphysema Father   . Heart attack Father   . Hypertension Mother    Scheduled Meds: . budesonide (PULMICORT) nebulizer solution  0.5 mg Nebulization BID  . ceFEPime (MAXIPIME) IV  2 g Intravenous Q8H  . chlorhexidine gluconate (MEDLINE KIT)  15 mL Mouth Rinse BID  . [START ON October 30, 2016] clonazePAM  0.25 mg Per Tube QHS  . feeding supplement (PRO-STAT SUGAR FREE 64)  30 mL Per Tube TID  . insulin aspart  0-24 Units Subcutaneous Q4H  . mouth rinse  15 mL Mouth Rinse QID  . pantoprazole  40 mg Intravenous Q12H  . potassium chloride  20 mEq Per Tube BID  . sodium chloride flush  10-40 mL Intracatheter Q12H   Continuous Infusions: . sodium chloride 10 mL/hr at 10/28/16 1300  . sodium chloride 10 mL/hr at 10/28/16 1300  . feeding supplement (VITAL AF 1.2 CAL) Stopped (10/28/16 0941)  .  fentaNYL infusion INTRAVENOUS 25 mcg/hr (10/28/16 1300)   PRN Meds:.acetaminophen (TYLENOL) oral liquid 160 mg/5 mL, fentaNYL, metoprolol, midazolam, ondansetron (ZOFRAN) IV, sodium chloride flush Medications Prior to Admission:  Prior to Admission medications   Medication Sig Start Date End Date Taking? Authorizing Provider  doxazosin (CARDURA) 4 MG tablet Take 4 mg by mouth daily.    Yes Historical Provider, MD  ELIQUIS 5 MG TABS tablet TAKE 1 TABLET BY MOUTH TWICE A DAY 09/11/16  Yes Amber Sena Slate, NP  furosemide (LASIX) 40 MG tablet Take 40 mg by mouth  as directed. Once daily but patient may take an additional dose in the afternoon for fluid retention. 04/13/16  Yes Historical Provider, MD  Magnesium 250 MG TABS Take 250 mg by mouth daily.  03/10/16  Yes Historical Provider, MD  albuterol (PROVENTIL HFA) 108 (90 BASE) MCG/ACT inhaler Inhale 2 puffs into the lungs every 6 (six) hours as needed for wheezing or shortness of breath.     Historical Provider, MD  fluticasone (FLONASE) 50 MCG/ACT nasal spray Place 2 sprays into both nostrils 2 (two) times daily as needed for allergies.  12/25/11   Historical Provider, MD  Fluticasone-Salmeterol (ADVAIR DISKUS) 250-50 MCG/DOSE AEPB Inhale 1 puff into the lungs daily as needed (for shortness of breath or wheezing).  01/02/16   Historical Provider, MD  metoprolol (LOPRESSOR) 100 MG tablet TAKE 1 TABLET TWICE A DAY 10/21/16   Minna Merritts, MD   Allergies  Allergen Reactions  . Diltiazem Hcl Other (See Comments)    REACTION: SOB/swelling   Review of Systems  Unable to perform ROS: Acuity of condition    Physical Exam  Constitutional: She appears cachectic. She is intubated.  Cardiovascular: Tachycardia present.   Pulmonary/Chest: She is intubated. She has decreased breath sounds in the right lower field and the left lower field.  Abdominal: Soft. Bowel sounds are decreased.  Neurological: She is unresponsive.  Skin: Skin is warm and dry.     Vital Signs: BP (!) 145/74   Pulse 94   Temp 98.1 F (36.7 C) (Other (Comment))   Resp 19   Ht 5' 4"  (1.626 m)   Wt 101.7 kg (224 lb 3.3 oz)   SpO2 97%   BMI 38.49 kg/m  Pain Assessment: CPOT POSS *See Group Information*: S-Acceptable,Sleep, easy to arouse Pain Score: 0-No pain   SpO2: SpO2: 97 % O2 Device:SpO2: 97 % O2 Flow Rate: .O2 Flow Rate (L/min): 40 L/min  IO: Intake/output summary:  Intake/Output Summary (Last 24 hours) at 10/28/16 1334 Last data filed at 10/28/16 1300  Gross per 24 hour  Intake          1809.74 ml  Output             1645 ml  Net           164.74 ml    LBM: Last BM Date: 10/26/16 Baseline Weight: Weight: 88.9 kg (196 lb) Most recent weight: Weight: 101.7 kg (224 lb 3.3 oz)     Palliative Assessment/Data: 10 % post extubation    Discussed with Dr Joylene Grapes and he is updating attending team.  Time In: 1300 Time Out: 1500 Time Total: 120 min Greater than 50%  of this time was spent counseling and coordinating care related to the above assessment and plan.  Signed by: Wadie Lessen, NP   Please contact Palliative Medicine Team phone at [phone] for questions and concerns.  For individual provider: See Shea Evans

## 2016-10-28 NOTE — Progress Notes (Signed)
Patient transported to CT and back to room 2S16 without complications.

## 2016-10-28 NOTE — Procedures (Signed)
Extubation Procedure Note  Patient Details:   Name: [name] DOB: [birth year] MRN: [id number]   Airway Documentation:     Evaluation  O2 sats: stable throughout Complications: No apparent complications Patient did tolerate procedure well. Bilateral Breath Sounds: Clear   No   Patient extubated per MD per Withdrawal of Life protocol.  Patient tolerated well.  No complications noted.  Durwin GlazeBrown, Trentin Knappenberger N 10/28/2016, 3:47 PM

## 2016-10-28 NOTE — Progress Notes (Signed)
SUBJECTIVE: Awake on vent but only responsive to pain. Does not follow commands  CURRENT MEDICATIONS: . budesonide (PULMICORT) nebulizer solution  0.25 mg Nebulization BID  . ceFEPime (MAXIPIME) IV  2 g Intravenous Q8H  . chlorhexidine gluconate (MEDLINE KIT)  15 mL Mouth Rinse BID  . clonazePAM  0.25 mg Per Tube BID  . feeding supplement (PRO-STAT SUGAR FREE 64)  30 mL Per Tube TID  . insulin aspart  0-24 Units Subcutaneous Q4H  . mouth rinse  15 mL Mouth Rinse QID  . pantoprazole  40 mg Intravenous Q12H  . potassium chloride  20 mEq Per Tube BID  . sodium chloride flush  10-40 mL Intracatheter Q12H   . sodium chloride 10 mL/hr at 10/28/16 0800  . sodium chloride 10 mL/hr at 10/28/16 0800  . feeding supplement (VITAL AF 1.2 CAL) 1,000 mL (10/28/16 0800)  . fentaNYL infusion INTRAVENOUS Stopped (10/28/16 0725)    OBJECTIVE: Physical Exam: Vitals:   10/28/16 0600 10/28/16 0700 10/28/16 0800 10/28/16 0823  BP:  (!) 151/69 (!) 128/56   Pulse: 95 88 (!) 51   Resp: _0 Temp:      TempSrc:      SpO2: 98% 99% 100% 100%  Weight:  224 lb 3.3 oz (101.7 kg)    Height:        Intake/Output Summary (Last 24 hours) at 10/28/16 6812 Last data filed at 10/28/16 0800  Gross per 24 hour  Intake           2078.7 ml  Output             1390 ml  Net            688.7 ml    Telemetry reveals atrial fibrillation with controlled ventricular response, some pauses  GEN- The patient is acutely ill appearing, non responsive on vent  Head- normocephalic, atraumatic Eyes-  Sclera clear, conjunctiva pink Ears- hearing intact Oropharynx- clear Neck- supple, hematoma resolved  Lungs- Clear to ausculation bilaterally, normal work of breathing Heart- Irregular rate and rhythm  GI- soft, NT, ND, + BS Extremities- no clubbing, cyanosis  Skin- diffuse ecchymosis   LABS: Basic Metabolic Panel:  Recent Labs  10/27/16 0400 10/28/16 0401  NA 147* 147*  K 4.5 4.6  CL 109 111  CO2  29 31  GLUCOSE 124* 113*  BUN 44* 46*  CREATININE 0.93 0.85  CALCIUM 7.8* 7.9*   Liver Function Tests:  Recent Labs  10/27/16 0400  AST 20  ALT 10*  ALKPHOS 87  BILITOT 1.4*  PROT 5.3*  ALBUMIN 2.0*   CBC:  Recent Labs  10/27/16 0400 10/28/16 0401  WBC 17.3* 18.9*  HGB 9.7* 9.9*  HCT 31.6* 32.5*  MCV 99.7 102.2*  PLT 349 371     ASSESSMENT AND PLAN:  Active Problems:   Perforation of cardiac device   Acute respiratory failure with hypoxia (HCC)  1.  Permanent atrial fibrillation S/p attempted Watchman placement 12/7. She developed profound hypotension during procedure with large pericardial effusion and tamponade. She was taken emergently to OR for sternotomy and repair of LAA. She also underwent LAA clipping at that time.  Required re-intubation last week Per nursing, was following commands yesterday, but not today Only responsive to pain this morning Will need to discuss with HCPOA today goals of care to determine plan. Dr Rayann Heman will call today as they are not in hospital this morning.   Chanetta Marshall, NP 10/28/2016 8:32  AM   Addendum - Dr  spoke with Sherry - HCPOA - cell [phone].  I have placed consult for palliative care and spoken with their team.  Sherry aware that palliative care will be in contact to set up time for meeting as they are able.   [name], NP 10/28/2016 9:32 AM  I have seen, examined the patient, and reviewed the above assessment and plan.  She is very ill.  Change of recovery appears low.  I am very sorry for her condition.  Long conversation with POA by phone.  I was able to meet with the patients family support in person with the palliative care team.  Her POA and close friends are considering withdrawal of care.  We will be very diligent to keep the patient comfortable during this time.  Dr Gerhardt made aware.  Co Sign:  , MD 10/28/2016 2:30 PM    

## 2016-10-29 DIAGNOSIS — F411 Generalized anxiety disorder: Secondary | ICD-10-CM

## 2016-10-29 DIAGNOSIS — R0600 Dyspnea, unspecified: Secondary | ICD-10-CM

## 2016-10-29 DIAGNOSIS — Z66 Do not resuscitate: Secondary | ICD-10-CM

## 2016-10-29 DIAGNOSIS — Z515 Encounter for palliative care: Secondary | ICD-10-CM

## 2016-10-29 MED ORDER — MIDAZOLAM HCL 2 MG/2ML IJ SOLN: 2.0000 mg | INTRAMUSCULAR | Status: DC | PRN

## 2016-10-29 MED ORDER — FENTANYL BOLUS VIA INFUSION
50.0000 ug | INTRAVENOUS | Status: DC | PRN
  Administered 2016-10-29: 50 ug via INTRAVENOUS
  Filled 2016-10-29: qty 50

## 2016-10-29 MED ORDER — SODIUM CHLORIDE 0.9 % IV SOLN
2.0000 mg/h | INTRAVENOUS | Status: DC
  Administered 2016-10-29: 1 mg/h via INTRAVENOUS
  Filled 2016-10-29: qty 10

## 2016-10-31 ENCOUNTER — Telehealth: Payer: Self-pay | Admitting: Cardiovascular Disease

## 2016-10-31 NOTE — Telephone Encounter (Signed)
[facility] Dr.Cooper signed and completed. Called spoke with Vernona RiegerLaura she is aware d/c ready for pick up.

## 2016-11-11 NOTE — Progress Notes (Signed)
Nutrition Follow Up/Brief Note  Chart reviewed. Pt extubated and TF discontinued. Transitioning to full comfort care.  Please re-consult as needed.   Maureen ChattersKatie Donovyn Guidice, RD, LDN Pager #: [phone] After-Hours Pager #: [phone]

## 2016-11-11 NOTE — Discharge Summary (Signed)
     ELECTROPHYSIOLOGY PROCEDURE DISCHARGE SUMMARY    Patient ID: [name],  MRN: [id number], DOB/AGE: [birth year] 82 y.o.  Admit date: 10/22/2016 Discharge date: 10/30/2016  Primary Care Physician: Dorian Heckle, MD Primary Cardiologist: Rockey Situ  Primary Discharge Diagnosis:  1.  Persistent atrial fibrillation 2.  Prior GI bleeding 3.  Pericardial bleeding as a complication of watchman device placement requiring emergent surgical intervention  Allergies  Allergen Reactions  . Diltiazem Hcl Other (See Comments)    REACTION: SOB/swelling    Procedures This Admission:  1.  Watchman device placement on 10/13/2016 by Dr Rayann Heman and Dr Burt Knack.  The procedure was complicated by pericardial bleeding requiring emergent surgical intervention 2.  Sternotomy with left atrial appendage repair and clipping on 10/21/2016 by Dr Cyndia Bent.   Brief HPI/Hospital Course:  [name] is a 82 y.o. female with a past medical history significant for persistent atrial fibrillation, prior GI bleeding, left atrial appendage thrombus, hypertension, hyperlipidemia, diabetes, and obesity.  She was previously on Eliquis but developed GI bleeding requiring transfusion. She was referred for consideration of Watchman implant.  At time of TEE, she was found to have left atrial appendage thrombus.  She was restarted on Eliquis and after several months, her thrombus resolved. She then presented for Watchman device implant. Risks, benefits of the procedure were reviewed with the patient who wished to proceed.  During procedure on 11/05/2016, she developed acute hypotension with tamponade and was taken emergently to the OR for left atrial appendage repair and clipping by Dr Cyndia Bent. She initially did well post op, but then developed increasing respiratory difficulties and confusion. She required re-intubation for acute hypoxic respiratory failure. After several days of support, she was not progressing and became unresponsive to  commands.  Palliative care was consulted who met with the family and Dr Rayann Heman. After discussion, the patient's support team (POA and friends) made the decision to transition patient to comfort care.  She was terminally extubated and kept comfortable under the direction of the palliative care team. She passed away on 28-Nov-2016.    Duration of discharge encounter: Greater than 30 minutes    Signed, Chanetta Marshall, NP 10/30/2016 9:47 AM  Thompson Grayer MD, [facility] 10/30/2016 2:52 PM

## 2016-11-11 NOTE — Care Management Important Message (Signed)
Important Message  Patient Details  Name: Julie AmmonsJoanne S Montejano MRN: [id number] Date of Birth: [birth year]   Medicare Important Message Given:  Yes    Kimarion Chery Abena 10/14/2016, 11:19 AM

## 2016-11-11 NOTE — Progress Notes (Signed)
Extubated yesterday afternoon, comfort measures in place.    Appreciate palliative care team.  Will transfer patient back to our service and place transfer orders to [facility]  Gypsy BalsamAmber Seiler, NP 09-01-16 7:08 AM  Comfort measures in place.  Family at bedside.    Hillis RangeJames Shallyn Constancio MD, [facility] Center[facility]ACC 09-01-16 10:19 AM

## 2016-11-11 NOTE — Progress Notes (Signed)
Pt. Had expired versed I.v 20 c.c wasted, and fentanyl 170 c.c s wasted in sink witnessed by Hermine MessickJ. Carson RN.

## 2016-11-11 NOTE — Progress Notes (Signed)
PCCM CONSULT NOTE  Admission date: 10/13/2016 Consult date: 10/23/2016 Referring provider: Dr. Laneta SimmersBartle  CC: Short of breath  HPI: 82 yo female presented 2016-03-17 for implantation of Lt atrial appendage occluder.  She has permanent A fib on eliquis.  She developed hypotension post procedure from pericardial effusion and tamponade physiology.  She had emergent sternotomy with clipping of atrial appendage.  She improved after surgery and was extubated.  She developed worsening hypoxia with pulmonary infiltrates on 12/13 and required intubation.  She was started on Abx, and continued on diuresis.  She was hypotensive post-procedure and noted to have maroon colored stool.  She has hx of GI bleeding.  Subjective:  RN reports concern for possible neuro change.  Pt remains on fentanyl gtt.  Episode of nausea / vomiting (yellow material) earlier in am.  TF on hold.  Palliative care consulted per primary.  Vital signs: BP (!) 167/68   Pulse 82   Temp 98.8 F (37.1 C) (Oral)   Resp 18   Ht 5\' 4"  (1.626 m)   Wt 101.7 kg (224 lb 3.3 oz)   SpO2 (!) 87%   BMI 38.49 kg/m   Intake/output: I/O last 3 completed shifts: In: 1722 [I.V.:794.7; NG/GT:827.3; IV Piggyback:100] Out: 1825 [Urine:1725; Stool:100]  General: ill appearing, frail Neuro: eyes open, generalized weakness, makes eye contact, moves LE's, no follow commands  HEENT: ETT in place Cardiac: sternal wound clean, s1 s2 irregular Chest: resps even non labored, lungs bilaterally coarse Abd: soft, non tender Ext: 1+ edema Skin: multiple areas of ecchymosis   CMP Latest Ref Rng & Units 10/28/2016 10/27/2016 10/26/2016  Glucose 65 - 99 mg/dL 161(W113(H) 960(A124(H) 540(J126(H)  BUN 6 - 20 mg/dL 81(X46(H) 91(Y44(H) 78(G35(H)  Creatinine 0.44 - 1.00 mg/dL 9.560.85 2.130.93 0.860.90  Sodium 135 - 145 mmol/L 147(H) 147(H) 146(H)  Potassium 3.5 - 5.1 mmol/L 4.6 4.5 3.9  Chloride 101 - 111 mmol/L 111 109 107  CO2 22 - 32 mmol/L 31 29 32  Calcium 8.9 - 10.3 mg/dL 7.9(L) 7.8(L)  7.5(L)  Total Protein 6.5 - 8.1 g/dL - 5.3(L) -  Total Bilirubin 0.3 - 1.2 mg/dL - 5.7(Q1.4(H) -  Alkaline Phos 38 - 126 U/L - 87 -  AST 15 - 41 U/L - 20 -  ALT 14 - 54 U/L - 10(L) -   CBC Latest Ref Rng & Units 10/28/2016 10/27/2016 10/26/2016  WBC 4.0 - 10.5 K/uL 18.9(H) 17.3(H) 15.2(H)  Hemoglobin 12.0 - 15.0 g/dL 4.6(N9.9(L) 6.2(X9.7(L) 5.2(W9.7(L)  Hematocrit 36.0 - 46.0 % 32.5(L) 31.6(L) 31.4(L)  Platelets 150 - 400 K/uL 371 349 335   ABG    Component Value Date/Time   PHART 7.467 (H) 10/25/2016 0403   PCO2ART 48.6 (H) 10/25/2016 0403   PO2ART 151.0 (H) 10/25/2016 0403   HCO3 35.2 (H) 10/25/2016 0403   TCO2 37 10/25/2016 0403   ACIDBASEDEF 4.0 (H) 10/18/2016 1355   O2SAT 99.0 10/25/2016 0403    CBG (last 3)   Recent Labs  10/28/16 0345 10/28/16 0709 10/28/16 1114  GLUCAP 110* 125* 128*     Imaging: Ct Head Wo Contrast  Result Date: 10/28/2016 CLINICAL DATA:  82 year old female with history of nausea and vomiting this morning. Clinical concern for encephalopathy. EXAM: CT HEAD WITHOUT CONTRAST TECHNIQUE: Contiguous axial images were obtained from the base of the skull through the vertex without intravenous contrast. COMPARISON:  No prior. FINDINGS: Brain: Mild cerebral atrophy. Patchy and confluent areas of decreased attenuation are noted throughout the deep and periventricular white  matter of the cerebral hemispheres bilaterally, compatible with chronic microvascular ischemic disease. No evidence of acute infarction, hemorrhage, hydrocephalus, extra-axial collection or mass lesion/mass effect. Vascular: No hyperdense vessel or unexpected calcification. Skull: Normal. Negative for fracture or focal lesion. Sinuses/Orbits: Extensive mucosal thickening in the right frontal sinus and frontoethmoidal recess. Nasogastric tube noted. Other: Endotracheal tube incompletely visualized. IMPRESSION: 1. No acute intracranial abnormalities. 2. Mild cerebral atrophy with chronic microvascular ischemic  changes in the cerebral white matter, as above. 3. Chronic sinus disease in the right frontal sinus and frontoethmoidal recess. Electronically Signed   By: Trudie Reedaniel  Entrikin M.D.   On: 10/28/2016 12:49   Dg Chest Port 1 View  Result Date: 10/28/2016 CLINICAL DATA:  Intubated EXAM: PORTABLE CHEST 1 VIEW COMPARISON:  Chest radiograph from one day prior. FINDINGS: Endotracheal tube tip is 2.1 cm above the carina. Enteric tube enters stomach with the tip not seen on this image. Right PICC terminates in the lower third of the superior vena cava. Stable configuration of median sternotomy wires. Stable cardiomediastinal silhouette with moped cardiomegaly. No pneumothorax. Stable small bilateral pleural effusions. Patchy bibasilar lung opacities appear stable. Stable mild pulmonary edema. IMPRESSION: 1. Well-positioned support structures as described. 2. Stable mild congestive heart failure and small bilateral pleural effusions. 3. Stable patchy bibasilar lung opacities, which could represent atelectasis, aspiration or pneumonia. Electronically Signed   By: Delbert PhenixJason A Poff M.D.   On: 10/28/2016 07:38    Cultures: Sputum 12/13 >>  Antibiotics:  Cefepime 12/13 >>   Events: 12/07 Admit, Watchman procedure, cardiac tamponade, emergent sternotomy 12/13 Hypoxia, VDRF, hypotension after intubation, melanotic stool 12/15 Off protonix gtt 12/17 Weaned on PSV 12/18 Concern for mental status change > CT head   Lines/tubes: Rt chest tube 12/07 >> 12/10 Femoral CVL 12/07 >> 12/09 ETT 12/07 >> 12/08 Rt PICC 12/09 >>  ETT 12/13 >>   Assessment/plan:  Acute hypoxic respiratory failure from acute pulmonary edema +/- HCAP. - Terminally extubated. - Full comfort care.  Possible HCAP. - D/C abx, full comfort care.  Cardiac tamponade s/p sternotomy. Permanent A fib s/p Watchman Procedure. Hypotension after intubation - resolved Hx of HTN, HLD. - Morphine for comfort  Hypokalemia. Hypophosphatemia. - D/C  further blood draws  Melanotic stool noted 12/13 with hx of GIB >> no further episodes since. - D/C protonix - Comfort feeds if able (not currently).  Anemia of critical illness. - D/C further blood draws.  DM type II. - D/C CBGs  Acute metabolic encephalopathy. R/O CNS Event - concern for change in neuro exam 12/18 - Comfort measures only  DVT prophylaxis - none SUP - none Nutrition - D/c TF Goals of care - DNR, full comfort care  Discussed with bedside RN  PCCM will sign off, please call back if needed.  Alyson ReedyWesam G. Ai Sonnenfeld, M.D. [facility]. Pager: [phone]. After hours pager: [phone].

## 2016-11-11 NOTE — Progress Notes (Signed)
Responded to spiritual care consult who is preparing for discharge to comfort care. Family at bedside and very support of patient.  Family is accepting and prepared for patient transition.  Prayed with patient and family. Provided listening, ministry of presence,emotional and spiritual support.  Will follow as needed.   10/11/2016 1200  Clinical Encounter Type  Visited With Patient and family together;Health care provider  Visit Type Initial;Spiritual support;Patient actively dying  Referral From Nurse  Spiritual Encounters  Spiritual Needs Prayer;Emotional  Venida JarvisWatlington, Reon Hunley, Chaplain,pager [phone]

## 2016-11-11 NOTE — Care Management Note (Signed)
Case Management Note  Patient Details  Name: Vernie AmmonsJoanne S Po MRN: [id number] Date of Birth: [birth year]  Subjective/Objective:    Pt is s/p sternotomy and clipping                Action/Plan:  PTA independent from home alone - neighbor will be support system at discharge.  Recommendation for SNF - CSW consulted for placement   Expected Discharge Date:                  Expected Discharge Plan:     In-House Referral:     Discharge planning Services  CM Consult  Post Acute Care Choice:    Choice offered to:     DME Arranged:    DME Agency:     HH Arranged:    HH Agency:     Status of Service:  In process, will continue to follow  If discussed at Long Length of Stay Meetings, dates discussed:    Additional Comments: 12/09/2015 Pt transitioned to full terminally extubated this am and transitioned to full comfort care this am Cherylann ParrClaxton, Johnsie Moscoso S, RN 12/09/2015, 10:16 AM

## 2016-11-11 NOTE — Progress Notes (Signed)
Daily Progress Note   Patient Name: SIMRAT KENDRICK       Date: 2016/10/31 DOB: [birth year]  Age: 82 y.o. MRN#: [id number] Attending Physician: Thompson Grayer, MD Primary Care Physician: Dorian Heckle, MD Admit Date: 10/30/2016  Reason for Consultation/Follow-up: Non pain symptom management, Psychosocial/spiritual support and Terminal Care  Subjective: Patient is barely responsive.  Close friend at bedside ([place] husband).  He says they are prepared for her passing.  He is interested in her comfort.  We discussed medication changes, move to 6N and spiritual support.  Length of Stay: 12  Current Medications: Scheduled Meds:  . chlorhexidine gluconate (MEDLINE KIT)  15 mL Mouth Rinse BID  . glycopyrrolate  0.4 mg Intravenous QID  . mouth rinse  15 mL Mouth Rinse QID  . sodium chloride flush  10-40 mL Intracatheter Q12H    Continuous Infusions: . sodium chloride 10 mL/hr at October 31, 2016 0600  . sodium chloride Stopped (10/28/16 2053)  . fentaNYL infusion INTRAVENOUS 200 mcg/hr (October 31, 2016 0927)  . midazolam (VERSED) infusion      PRN Meds: fentaNYL, midazolam, ondansetron (ZOFRAN) IV, sodium chloride flush  Physical Exam  Constitutional:  Appears actively dying.  Complexion reddened. Lips purple. Eyes open.  Foley with approx 100 ml of urine.  HENT:  Head: Normocephalic and atraumatic.  Eyes:  fixed  Neck:  Significant left sided pulsations noted.  Cardiovascular:  irreg irreg with murmur  Pulmonary/Chest:  Min-mod increased work of breathing.  Coarse sounds.  Abdominal: Soft.  Musculoskeletal: She exhibits edema.  2-3+ edema in arms and LE  Neurological:  Essentially non-responsive to voice or light touch.  Skin: Skin is warm. There is erythema.  Psychiatric:  Unable to  assess        Vital Signs: BP (!) 167/68   Pulse 82   Temp 98.8 F (37.1 C) (Oral)   Resp 18   Ht _0  (1.626 m)   Wt 101.7 kg (224 lb 3.3 oz)   SpO2 (!) 87%   BMI 38.49 kg/m  SpO2: SpO2: (!) 87 % O2 Device: O2 Device: Not Delivered O2 Flow Rate: O2 Flow Rate (L/min): 40 L/min  Intake/output summary:  Intake/Output Summary (Last 24 hours) at Oct 31, 2016 0935 Last data filed at 2016-10-31 0600  Gross per 24 hour  Intake  500.99 ml  Output             1225 ml  Net          -724.01 ml   LBM: Last BM Date: 10/26/16 Baseline Weight: Weight: 88.9 kg (196 lb) Most recent weight: Weight: 101.7 kg (224 lb 3.3 oz)       Palliative Assessment/Data:    Flowsheet Rows   Flowsheet Row Most Recent Value  Intake Tab  Referral Department  Cardiology  Unit at Time of Referral  ICU  Date Notified  10/28/16  Palliative Care Type  New Palliative care  Reason for referral  Clarify Goals of Care  Date of Admission  11/05/2016  Date first seen by Palliative Care  10/28/16  # of days Palliative referral response time  0 Day(s)  # of days IP prior to Palliative referral  11  Clinical Assessment  Psychosocial & Spiritual Assessment  Palliative Care Outcomes      Patient Active Problem List   Diagnosis Date Noted  . DNR (do not resuscitate) 10/28/2016  . Palliative care by specialist 10/28/2016  . Dyspnea 10/28/2016  . Acute on chronic respiratory failure with hypoxia ([place])   . Perforation of cardiac device 10/16/2016  . Permanent atrial fibrillation ([place])   . GI bleed 03/12/2015  . Gait instability 10/26/2014  . Chronic diastolic CHF (congestive heart failure) ([place]) 03/18/2014  . GIB (gastrointestinal bleeding) 03/01/2014  . Diverticulosis 03/01/2014  . Systolic murmur 20/25/4270  . PULMONARY HYPERTENSION, SECONDARY 01/16/2011  . ALLERGIC RHINITIS 01/16/2011  . Diabetes type 2, controlled ([place]) 01/15/2011  . Hyperlipidemia 01/15/2011  . Obesity 01/15/2011  . Essential  hypertension 01/15/2011  . Atrial fibrillation ([place]) 01/15/2011  . VIRAL PNEUMONIA 01/15/2011  . CELLULITIS, LEG, RIGHT 01/15/2011  . PLANTAR FASCIITIS 01/15/2011  . COLONIC POLYPS, HX OF 01/15/2011    Palliative Care Assessment & Plan   Patient Profile: 82 y.o. female   admitted on 10/23/2016  for implantation of Lt atrial appendage occluder. She has permanent A fib on eliquis. She developed hypotension post procedure from pericardial effusion and tamponade physiology. She had emergent sternotomy with clipping of atrial appendage. She improved after surgery and was extubated. She developed worsening hypoxia with pulmonary infiltrates on 12/13 and required intubation. She was started on Abx, and continued on diuresis. She was hypotensive post-procedure and noted to have maroon colored stool. She has hx of GI bleeding.  Patient is extubated.  On fentanyl drip, with eyes open, weakly responsive.  Actively dying.  Recommendations/Plan:  Will start low dose versed gtt as patient has received 24 hr benzodiazepines in the past. Continue fentanyl gtt.  Raise infusion rate to 200 mcg/hr.  Continue PRN bolus Continue robinul   Goals of Care and Additional Recommendations:  Limitations on Scope of Treatment: Full Comfort Care  Code Status:  DNR  Prognosis:   Hours - Days  Discharge Planning:  Anticipated Hospital Death  Care plan was discussed with family at bedside and RN  Thank you for allowing the Palliative Medicine Team to assist in the care of this patient.   Time In: 9:05 Time Out: 9:40 Total Time 35 min Prolonged Time Billed no      Greater than 50%  of this time was spent counseling and coordinating care related to the above assessment and plan.  Imogene Burn, PA-C Palliative Medicine   Please contact Palliative MedicineTeam phone at [phone] for questions and concerns.   Please see AMION for individual provider pager  numbers.

## 2016-11-11 NOTE — Progress Notes (Signed)
Patient transferred from 3S to 6n, non-vocal, labored breathing, bounding heart rate, report received from 3S nurse via phone. Fetanyl and Versed infusing through PICC line. Foley in place. Patient comfort care, family at bedside. No complaints at this time. Family oriented to room and staff. Will let patient recover from transport, if doesn't seem to be more comfortable will give more medicine.

## 2016-11-11 NOTE — Progress Notes (Signed)
      [address]       [place] [id number]             [phone]      12 Days Post-Op Procedure(s) (LRB): STERNOTOMY - Repair of Left Atrium (N/A) CLIPPING OF ATRIAL APPENDAGE (N/A)   Subjective:  Palliative care initiated. Extubated yesterday.  Remains unresponsive  Objective: Vital signs in last 24 hours: Temp:  [97.5 F (36.4 C)-98.8 F (37.1 C)] 98.8 F (37.1 C) (12/19 0700) Pulse Rate:  [69-120] 82 (12/19 0700) Cardiac Rhythm: Atrial fibrillation (12/19 0445) Resp:  [14-26] 18 (12/19 0700) BP: (129-167)/(51-86) 167/68 (12/19 0600) SpO2:  [87 %-100 %] 87 % (12/19 0700) FiO2 (%):  [40 %] 40 % (12/18 1245)  Intake/Output from previous day: 12/18 0701 - 12/19 0700 In: 712 [I.V.:524.7; NG/GT:137.3; IV Piggyback:50] Out: 1265 [Urine:1165; Stool:100]  General appearance: alert, cooperative and no distress Heart: regular rate and rhythm and tachy Lungs: clear to auscultation bilaterally Abdomen: soft, non-tender; bowel sounds normal; no masses,  no organomegaly Wound: clean and dry  Lab Results:  Recent Labs  10/27/16 0400 10/28/16 0401  WBC 17.3* 18.9*  HGB 9.7* 9.9*  HCT 31.6* 32.5*  PLT 349 371   BMET:  Recent Labs  10/27/16 0400 10/28/16 0401  NA 147* 147*  K 4.5 4.6  CL 109 111  CO2 29 31  GLUCOSE 124* 113*  BUN 44* 46*  CREATININE 0.93 0.85  CALCIUM 7.8* 7.9*    PT/INR: No results for input(s): LABPROT, INR in the last 72 hours. ABG    Component Value Date/Time   PHART 7.467 (H) 10/25/2016 0403   HCO3 35.2 (H) 10/25/2016 0403   TCO2 37 10/25/2016 0403   ACIDBASEDEF 4.0 (H) 10/18/2016 1355   O2SAT 99.0 10/25/2016 0403   CBG (last 3)   Recent Labs  10/28/16 0345 10/28/16 0709 10/28/16 1114  GLUCAP 110* 125* 128*    Assessment/Plan: S/P Procedure(s) (LRB): STERNOTOMY - Repair of Left Atrium (N/A) CLIPPING OF ATRIAL APPENDAGE (N/A)  1. Palliative care initiated, patient extubated yesterday- patient to be  transferred to [facility] on EP service 2. Will sign off   LOS: 12 days    Naylene Foell 10/25/2016

## 2016-11-11 NOTE — Progress Notes (Signed)
No charge note.   Patient with labored breathing.  Appears uncomfortable.  Will increase versed gtt and fentanyl bolus dose.  PMT will check back frequently.  Algis DownsMarianne York, New JerseyPA-C Palliative Medicine [phone] available 7am - 7 pm

## 2016-11-11 DEATH — deceased

## 2016-11-12 ENCOUNTER — Encounter (HOSPITAL_COMMUNITY): Payer: Self-pay | Admitting: Surgery

## 2017-08-17 IMAGING — CR DG CHEST 1V PORT
1 series · 1 of 1 positions shown · non-contrast
Comparison: Radiograph October 23, 2016.

CLINICAL DATA: Pneumonia.

EXAM:
PORTABLE CHEST 1 VIEW

[AP]
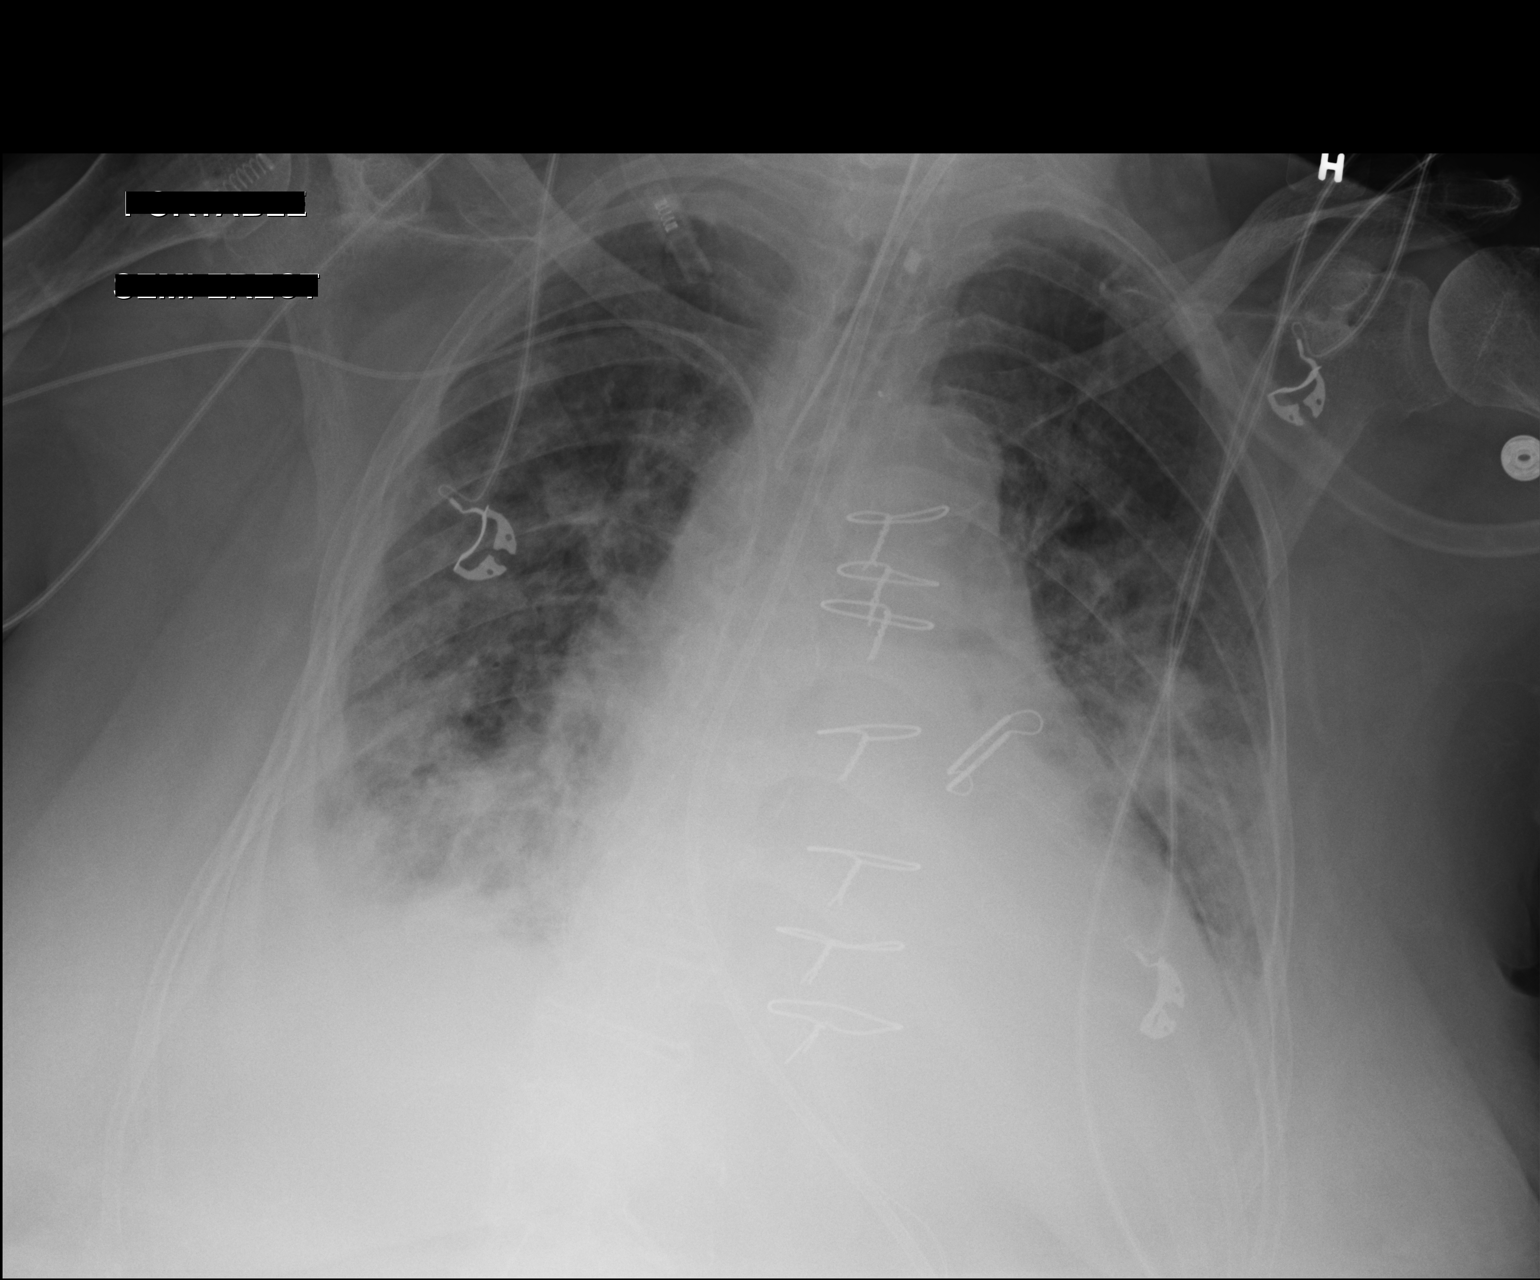

[1 of 1 positions shown; findings below may reference images not displayed]

FINDINGS: Endotracheal and feeding tube are unchanged in position. Stable
right-sided PICC line with distal tip in expected position of
cavoatrial junction. No pneumothorax is noted. Stable bibasilar
opacities are noted concerning for edema or pneumonia with
associated pleural effusions. Bony thorax is unremarkable.
IMPRESSION: Stable support apparatus. Stable bibasilar opacities as described
above.

## 2017-08-20 IMAGING — CR DG CHEST 1V PORT
1 series · 1 of 1 positions shown · non-contrast
Comparison: Yesterday.

CLINICAL DATA: Followup bilateral airspace opacities and pleural
fluid.

EXAM:
PORTABLE CHEST 1 VIEW

[AP]
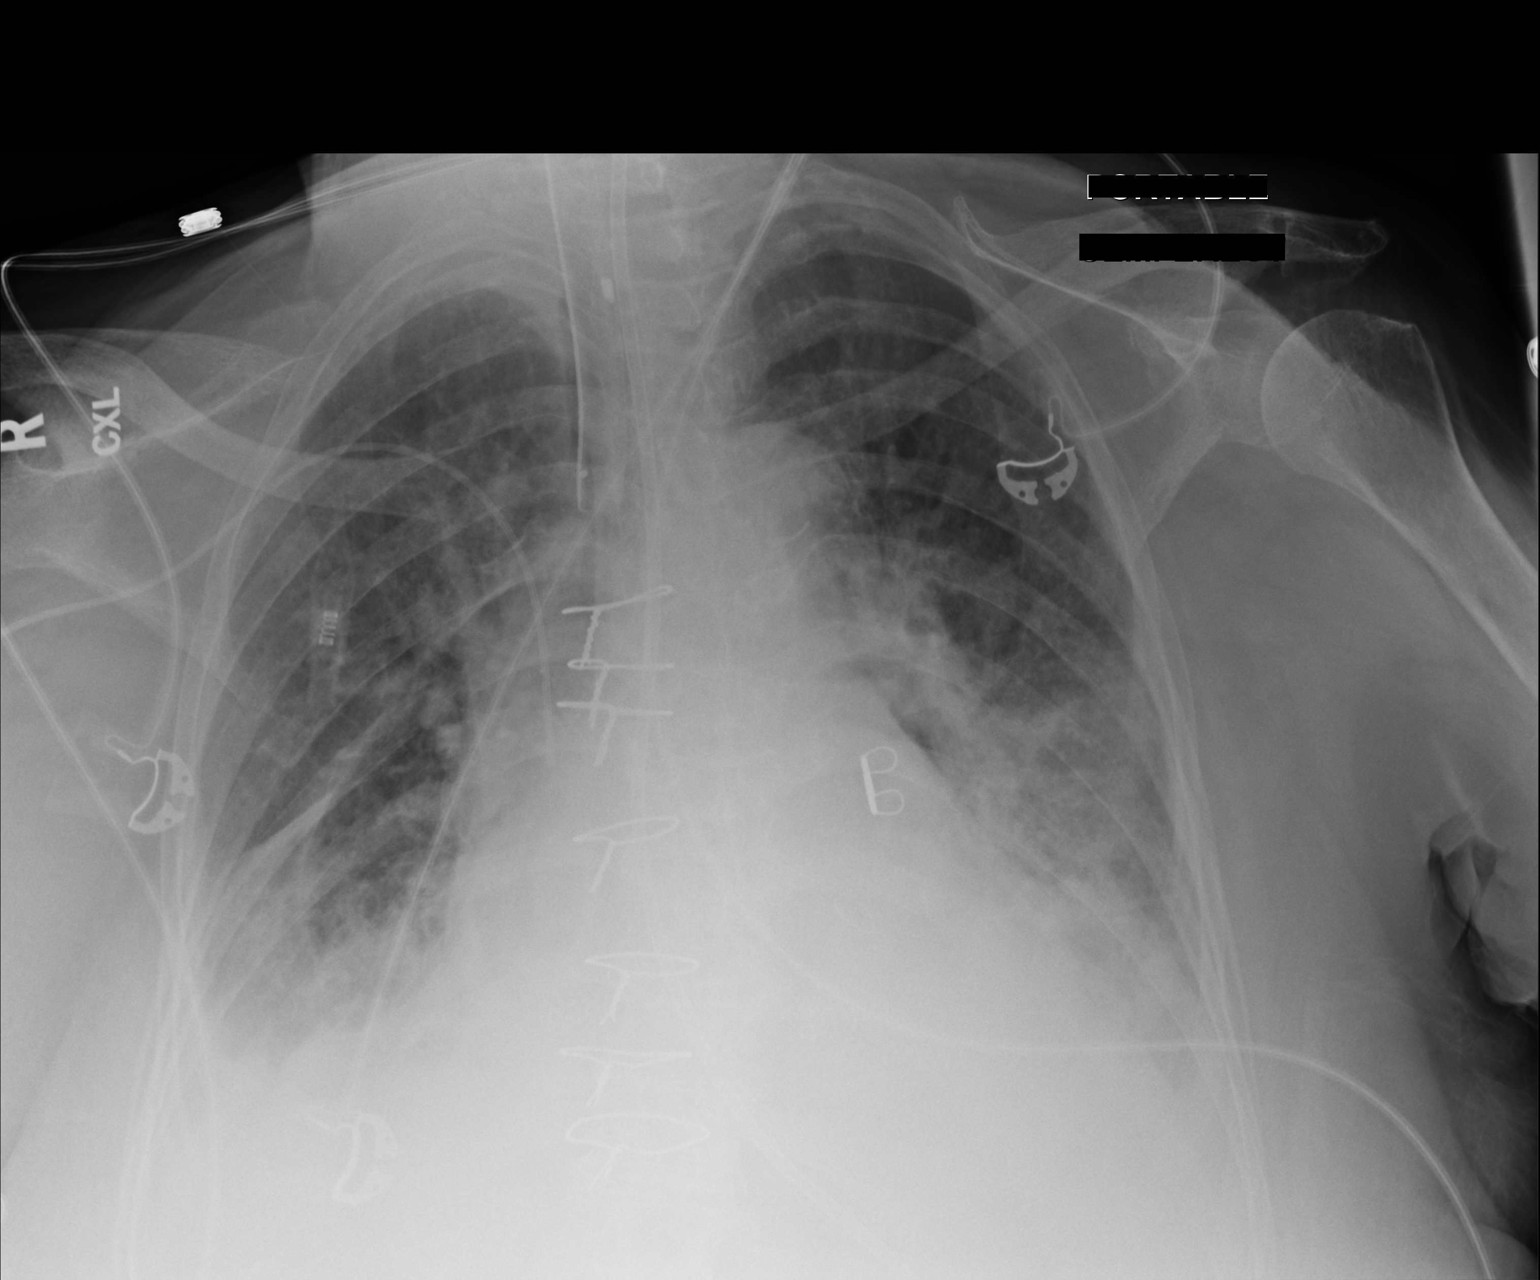

[1 of 1 positions shown; findings below may reference images not displayed]

FINDINGS: Endotracheal tube in satisfactory position. Feeding tube extending
into the stomach. Right PICC tip in the inferior aspect of the
superior vena cava. Stable median sternotomy wires and metallic
device overlying the left heart.

Stable enlarged cardiac silhouette. Mildly increased patchy opacity
in the left lower lung zone without significant change in patchy
opacity at the right lung base. The pulmonary vasculature and
interstitial markings remain prominent. Small bilateral pleural
effusions are unchanged. No acute bony abnormality.
IMPRESSION: 1. Mildly increased alveolar edema, pneumonia or aspiration
pneumonitis in the left lower lung zone.
2. Stable right basilar atelectasis, pneumonia or aspiration
pneumonitis.
3. Stable cardiomegaly and congestive heart failure with bilateral
pleural effusions.
